# Patient Record
Sex: Female | Born: 1955 | Race: White | Hispanic: No | Marital: Married | Smoking: Current every day smoker
Health system: Southern US, Community
[De-identification: ages and names within clinical notes are randomized; demographics above are authoritative.]

## PROBLEM LIST (undated history)

## (undated) DIAGNOSIS — R51 Headache: Secondary | ICD-10-CM

## (undated) DIAGNOSIS — R102 Pelvic and perineal pain: Secondary | ICD-10-CM

## (undated) DIAGNOSIS — K219 Gastro-esophageal reflux disease without esophagitis: Secondary | ICD-10-CM

## (undated) DIAGNOSIS — Z9889 Other specified postprocedural states: Secondary | ICD-10-CM

## (undated) DIAGNOSIS — Z973 Presence of spectacles and contact lenses: Secondary | ICD-10-CM

## (undated) DIAGNOSIS — E039 Hypothyroidism, unspecified: Secondary | ICD-10-CM

## (undated) DIAGNOSIS — Z972 Presence of dental prosthetic device (complete) (partial): Secondary | ICD-10-CM

## (undated) DIAGNOSIS — F419 Anxiety disorder, unspecified: Secondary | ICD-10-CM

## (undated) DIAGNOSIS — N3010 Interstitial cystitis (chronic) without hematuria: Secondary | ICD-10-CM

## (undated) DIAGNOSIS — R32 Unspecified urinary incontinence: Secondary | ICD-10-CM

## (undated) DIAGNOSIS — R350 Frequency of micturition: Secondary | ICD-10-CM

## (undated) DIAGNOSIS — N9489 Other specified conditions associated with female genital organs and menstrual cycle: Secondary | ICD-10-CM

## (undated) DIAGNOSIS — M1990 Unspecified osteoarthritis, unspecified site: Secondary | ICD-10-CM

## (undated) DIAGNOSIS — G4700 Insomnia, unspecified: Secondary | ICD-10-CM

## (undated) DIAGNOSIS — F32A Depression, unspecified: Secondary | ICD-10-CM

## (undated) DIAGNOSIS — I10 Essential (primary) hypertension: Secondary | ICD-10-CM

## (undated) DIAGNOSIS — R519 Headache, unspecified: Secondary | ICD-10-CM

## (undated) DIAGNOSIS — D649 Anemia, unspecified: Secondary | ICD-10-CM

## (undated) DIAGNOSIS — E785 Hyperlipidemia, unspecified: Secondary | ICD-10-CM

## (undated) DIAGNOSIS — R3915 Urgency of urination: Secondary | ICD-10-CM

## (undated) DIAGNOSIS — F329 Major depressive disorder, single episode, unspecified: Secondary | ICD-10-CM

## (undated) DIAGNOSIS — R112 Nausea with vomiting, unspecified: Secondary | ICD-10-CM

## (undated) HISTORY — PX: VAGINAL HYSTERECTOMY: SUR661

## (undated) HISTORY — PX: JOINT REPLACEMENT: SHX530

## (undated) HISTORY — PX: TONSILLECTOMY: SUR1361

---

## 1998-01-02 ENCOUNTER — Emergency Department (HOSPITAL_COMMUNITY): Admission: EM | Admit: 1998-01-02 | Discharge: 1998-01-02 | Payer: Self-pay | Admitting: Emergency Medicine

## 1998-05-26 ENCOUNTER — Emergency Department (HOSPITAL_COMMUNITY): Admission: EM | Admit: 1998-05-26 | Discharge: 1998-05-26 | Payer: Self-pay | Admitting: Emergency Medicine

## 1999-02-26 ENCOUNTER — Other Ambulatory Visit: Admission: RE | Admit: 1999-02-26 | Discharge: 1999-02-26 | Payer: Self-pay | Admitting: Gynecology

## 1999-09-03 ENCOUNTER — Ambulatory Visit (HOSPITAL_COMMUNITY): Admission: RE | Admit: 1999-09-03 | Discharge: 1999-09-03 | Payer: Self-pay | Admitting: Urology

## 1999-11-25 ENCOUNTER — Other Ambulatory Visit: Admission: RE | Admit: 1999-11-25 | Discharge: 1999-11-25 | Payer: Self-pay | Admitting: Family Medicine

## 2000-01-23 ENCOUNTER — Encounter: Payer: Self-pay | Admitting: Family Medicine

## 2000-01-23 ENCOUNTER — Encounter: Admission: RE | Admit: 2000-01-23 | Discharge: 2000-01-23 | Payer: Self-pay | Admitting: Family Medicine

## 2000-04-06 ENCOUNTER — Encounter: Admission: RE | Admit: 2000-04-06 | Discharge: 2000-04-06 | Payer: Self-pay | Admitting: Family Medicine

## 2000-04-06 ENCOUNTER — Encounter: Payer: Self-pay | Admitting: Family Medicine

## 2001-01-15 ENCOUNTER — Emergency Department (HOSPITAL_COMMUNITY): Admission: EM | Admit: 2001-01-15 | Discharge: 2001-01-15 | Payer: Self-pay | Admitting: Emergency Medicine

## 2001-01-26 ENCOUNTER — Encounter: Payer: Self-pay | Admitting: Family Medicine

## 2001-01-26 ENCOUNTER — Encounter: Admission: RE | Admit: 2001-01-26 | Discharge: 2001-01-26 | Payer: Self-pay | Admitting: Family Medicine

## 2001-04-15 ENCOUNTER — Ambulatory Visit (HOSPITAL_COMMUNITY): Admission: RE | Admit: 2001-04-15 | Discharge: 2001-04-15 | Payer: Self-pay | Admitting: Urology

## 2001-05-19 ENCOUNTER — Encounter: Admission: RE | Admit: 2001-05-19 | Discharge: 2001-05-19 | Payer: Self-pay | Admitting: Family Medicine

## 2001-05-19 ENCOUNTER — Encounter: Payer: Self-pay | Admitting: Family Medicine

## 2001-09-02 ENCOUNTER — Encounter: Payer: Self-pay | Admitting: Family Medicine

## 2001-09-02 ENCOUNTER — Encounter: Admission: RE | Admit: 2001-09-02 | Discharge: 2001-09-02 | Payer: Self-pay | Admitting: Family Medicine

## 2002-07-14 ENCOUNTER — Encounter: Payer: Self-pay | Admitting: Family Medicine

## 2002-07-14 ENCOUNTER — Encounter: Admission: RE | Admit: 2002-07-14 | Discharge: 2002-07-14 | Payer: Self-pay | Admitting: Family Medicine

## 2002-11-10 ENCOUNTER — Encounter: Payer: Self-pay | Admitting: Urology

## 2002-11-10 ENCOUNTER — Ambulatory Visit (HOSPITAL_BASED_OUTPATIENT_CLINIC_OR_DEPARTMENT_OTHER): Admission: RE | Admit: 2002-11-10 | Discharge: 2002-11-10 | Payer: Self-pay | Admitting: Urology

## 2003-02-14 ENCOUNTER — Encounter: Admission: RE | Admit: 2003-02-14 | Discharge: 2003-02-14 | Payer: Self-pay | Admitting: Family Medicine

## 2003-02-14 ENCOUNTER — Encounter: Payer: Self-pay | Admitting: Family Medicine

## 2003-05-23 ENCOUNTER — Ambulatory Visit (HOSPITAL_COMMUNITY): Admission: RE | Admit: 2003-05-23 | Discharge: 2003-05-23 | Payer: Self-pay | Admitting: Urology

## 2003-06-01 ENCOUNTER — Other Ambulatory Visit: Admission: RE | Admit: 2003-06-01 | Discharge: 2003-06-01 | Payer: Self-pay | Admitting: Gynecology

## 2003-10-31 ENCOUNTER — Encounter: Admission: RE | Admit: 2003-10-31 | Discharge: 2003-10-31 | Payer: Self-pay | Admitting: Family Medicine

## 2004-03-19 ENCOUNTER — Ambulatory Visit (HOSPITAL_BASED_OUTPATIENT_CLINIC_OR_DEPARTMENT_OTHER): Admission: RE | Admit: 2004-03-19 | Discharge: 2004-03-19 | Payer: Self-pay | Admitting: Urology

## 2004-03-19 ENCOUNTER — Ambulatory Visit (HOSPITAL_COMMUNITY): Admission: RE | Admit: 2004-03-19 | Discharge: 2004-03-19 | Payer: Self-pay | Admitting: Urology

## 2004-10-17 ENCOUNTER — Ambulatory Visit (HOSPITAL_BASED_OUTPATIENT_CLINIC_OR_DEPARTMENT_OTHER): Admission: RE | Admit: 2004-10-17 | Discharge: 2004-10-17 | Payer: Self-pay | Admitting: Urology

## 2004-11-12 ENCOUNTER — Encounter: Admission: RE | Admit: 2004-11-12 | Discharge: 2004-11-12 | Payer: Self-pay | Admitting: Family Medicine

## 2005-04-17 ENCOUNTER — Ambulatory Visit (HOSPITAL_COMMUNITY): Admission: RE | Admit: 2005-04-17 | Discharge: 2005-04-17 | Payer: Self-pay | Admitting: Urology

## 2005-04-17 ENCOUNTER — Ambulatory Visit (HOSPITAL_BASED_OUTPATIENT_CLINIC_OR_DEPARTMENT_OTHER): Admission: RE | Admit: 2005-04-17 | Discharge: 2005-04-17 | Payer: Self-pay | Admitting: Urology

## 2006-01-15 ENCOUNTER — Ambulatory Visit (HOSPITAL_BASED_OUTPATIENT_CLINIC_OR_DEPARTMENT_OTHER): Admission: RE | Admit: 2006-01-15 | Discharge: 2006-01-15 | Payer: Self-pay | Admitting: Urology

## 2006-11-24 ENCOUNTER — Ambulatory Visit (HOSPITAL_BASED_OUTPATIENT_CLINIC_OR_DEPARTMENT_OTHER): Admission: RE | Admit: 2006-11-24 | Discharge: 2006-11-24 | Payer: Self-pay | Admitting: Urology

## 2007-11-04 ENCOUNTER — Ambulatory Visit (HOSPITAL_BASED_OUTPATIENT_CLINIC_OR_DEPARTMENT_OTHER): Admission: RE | Admit: 2007-11-04 | Discharge: 2007-11-04 | Payer: Self-pay | Admitting: Urology

## 2009-02-20 ENCOUNTER — Ambulatory Visit (HOSPITAL_BASED_OUTPATIENT_CLINIC_OR_DEPARTMENT_OTHER): Admission: RE | Admit: 2009-02-20 | Discharge: 2009-02-20 | Payer: Self-pay | Admitting: Urology

## 2010-08-27 ENCOUNTER — Ambulatory Visit
Admission: RE | Admit: 2010-08-27 | Discharge: 2010-08-27 | Payer: Self-pay | Source: Home / Self Care | Attending: Urology | Admitting: Urology

## 2010-11-25 LAB — POCT I-STAT 4, (NA,K, GLUC, HGB,HCT)
Glucose, Bld: 88 mg/dL (ref 70–99)
Potassium: 4.5 mEq/L (ref 3.5–5.1)

## 2010-12-23 LAB — POCT HEMOGLOBIN-HEMACUE: Hemoglobin: 14.8 g/dL (ref 12.0–15.0)

## 2011-01-28 NOTE — Op Note (Signed)
Kaitlyn [name], Kaitlyn [name]                  ACCOUNT NO.:  [id number]   MEDICAL RECORD NO.:  [id number]          PATIENT TYPE:  AMB   LOCATION:  [facility]                         FACILITY:  [facility]   PHYSICIAN:  Excell Seltzer. Annabell Howells, M.D.    DATE OF BIRTH:  Oct 03, 1955   DATE OF PROCEDURE:  02/20/2009  DATE OF DISCHARGE:                               OPERATIVE REPORT   PROCEDURE:  Cystoscopy, hydrodistention of bladder, urethral dilation,  installation of Pyridium and Marcaine.   PREOPERATIVE DIAGNOSIS:  Interstitial cystitis.   POSTOPERATIVE DIAGNOSIS:  Interstitial cystitis.   SURGEON:  Dr. Bjorn Pippin.   ANESTHESIA:  General.   SPECIMEN:  None.   DRAINS:  16-French Foley catheter.   COMPLICATIONS:  None.   INDICATIONS:  Ms. Birdwell is a 55 year old white female with a long history  of interstitial cystitis who requires periodic hydrodistention.   FINDINGS AND PROCEDURE:  She was given Cipro.  She was taken to the  operating room and general anesthetic was induced.  She was placed in  lithotomy position, her perineum and genitalia were prepped with  Betadine solution.  She was draped in the usual sterile fashion.  Cystoscopy was performed using a 22-French scope and 70 degrees lens.  Examination revealed a normal urethra.  The bladder wall had mild  trabeculation but no mucosal lesions.  Ureteral orifices were  unremarkable.   The bladder was then the hydrodistention under 80 cm of water pressure  to capacity and held for 3 minutes.  The bladder was then drained.  Her  capacity under anesthesia was 650 mL.  The terminal efflux was bloody.  Repeat cystoscopy revealed diffuse glomerulations consistent with  interstitial cystitis.  The urethra was then calibrated to 32-French  with female sounds.  She was snug at 28.  She then underwent placement  of a 16 Foley catheter and the bladder was instilled with 30 mL of 0.25%  Marcaine with 400 mg of crushed Pyridium.  The catheter was then  plugged.  This  will be left plugged for 15 minutes then drained, but she  will be sent home with a Foley catheter.  A B and O suppository was  placed was taken down from lithotomy position.  Her anesthetic was  reversed.  She was removed to the recovery room in stable condition.  There no complications.      Excell Seltzer. Annabell Howells, M.D.  Electronically Signed     JJW/MEDQ  D:  02/20/2009  T:  02/20/2009  Job:  [id number]

## 2011-01-28 NOTE — Op Note (Signed)
NAMEVERONIA, Kaitlyn [name]                  ACCOUNT NO.:  [id number]   MEDICAL RECORD NO.:  [id number]          PATIENT TYPE:  AMB   LOCATION:  [facility]                         FACILITY:  [facility]   PHYSICIAN:  Excell Seltzer. Annabell Howells, M.D.    DATE OF BIRTH:  August 28, 1956   DATE OF PROCEDURE:  11/04/2007  DATE OF DISCHARGE:                               OPERATIVE REPORT   PROCEDURE:  Cysto, hydrodistention of the bladder, urethral dilation,  installation of Pyridium and Marcaine.   PREOPERATIVE DIAGNOSIS:  Interstitial cystitis.   POSTOPERATIVE DIAGNOSIS:  Interstitial cystitis.   SURGEON:  Dr. Bjorn Pippin.   ANESTHESIA:  General.   SPECIMEN:  None.   COMPLICATIONS:  None.   INDICATIONS:  Kaitlyn [name] is a 55 year old white female with a long history of  interstitial cystitis.  He was having recurrent symptoms and generally  responds well to hydrodistention and has been almost a year since her  last dilation.  She tends to have urinary retention following the  procedure and is sent home with a Foley.   FINDINGS AND PROCEDURE:  She was taken to the operating room a where  general anesthetic was induced. She had  been given Cipro  preoperatively.  She was placed in lithotomy position.  Her perineum and  genitalia were prepped with Betadine solution.  She was draped in the  usual sterile fashion.  Cystoscopy was performed using a 22-French scope  and a 12 degree lens.  Examination revealed a normal urethra.  The  bladder wall had minimal tract trabeculation with no mucosal lesions.  The ureters were unremarkable.  The bladder was then dilated to capacity  under 80 cm of water pressure.  This was held for 3 minutes, the bladder  was then drained.  Her capacity under anesthesia was 700 mL.  The  terminal efflux was bloody. Repeat cystoscopy after hydrodistention  revealed mild but diffuse glomerulations consistent with her diagnosis  of interstitial cystitis. After recystoscopy, the urethra was dilated to  32-French with female sounds.  A Foley catheter was then inserted, the  balloon was filled with 10 mL of  sterile fluid.  Her bladder was instilled with 30 mL of 0.25% Marcaine  with 400 mg of crushed Pyridium and a B&O suppository was placed. The  catheter was left plugged, her anesthetic was reversed, she was moved to  the recovery room in stable condition. There were no complications.      Excell Seltzer. Annabell Howells, M.D.  Electronically Signed     JJW/MEDQ  D:  11/04/2007  T:  11/05/2007  Job:  [id number]   cc:   Burnell Blanks, MD  Fax: [phone]

## 2011-01-31 NOTE — Op Note (Signed)
NAMESOUL, DEVENEY                  ACCOUNT NO.:  [id number]   MEDICAL RECORD NO.:  [id number]          PATIENT TYPE:  AMB   LOCATION:  [facility]                         FACILITY:  [facility]   PHYSICIAN:  Excell Seltzer. Annabell Howells, M.D.    DATE OF BIRTH:  [birth year]   DATE OF PROCEDURE:  04/17/2005  DATE OF DISCHARGE:                                 OPERATIVE REPORT   PROCEDURE:  Cystoscopy, hydrodistention of bladder, urethral dilation,  instillation of Pyridium and Marcaine.   PREOPERATIVE DIAGNOSIS:  Interstitial cystitis.   POSTOPERATIVE DIAGNOSIS:  Interstitial cystitis.   SURGEON:  Excell Seltzer. Annabell Howells, M.D.   ANESTHESIA:  General.   COMPLICATIONS:  None.   INDICATIONS:  Ms. Mungin is a 54 year old white female with a long history of  interstitial cystitis who generally responds to  q.6 month hydrodistentions.   FINDINGS AND PROCEDURE:  She was given p.o. antibiotics preoperatively and  was taken to the operating room where general anesthetic was induced. She  was placed in the lithotomy position. Perineum and genitalia were prepped  with Betadine solution and she was draped in usual sterile fashion.  Cystoscopy was performed using the 22-French scope and 70 degree lens.  Examination revealed a normal urethra. Bladder wall had mild trabeculation.  No tumor, stones or inflammation were noted. Ureteral orifices were  unremarkable.   The cystoscope was removed. A Foley catheter was inserted. The bladder was  then filled under 80 cm water pressure to capacity. This was held for 5  minutes and the bladder was drained. Her capacity under anesthesia was 650  cc. The terminal efflux was bloody.   Repeat cystoscopy revealed diffuse mild glomerular hemorrhages without  evidence of cracking or ulceration.   Her urethra was then calibrated with female sounds. She was tight at 11-  [place]. I did not push beyond that.   She then underwent instillation of 30 cc of 0.25% Marcaine with 4500 mg of  crushed  Pyridium. This was left indwelling in the bladder.   A Foley catheter was inserted. She will be sent home with the catheter. Her  anesthetic was reversed. She was moved to the recovery room in stable  condition. There were no complications.       JJW/MEDQ  D:  04/17/2005  T:  04/17/2005  Job:  [id number]   cc:   Dr. Nathanial Rancher, [place], [place]

## 2011-01-31 NOTE — Op Note (Signed)
NAMEKEYUNNA, COCO                  ACCOUNT NO.:  [id number]   MEDICAL RECORD NO.:  [id number]          PATIENT TYPE:  AMB   LOCATION:  [facility]                         FACILITY:  [facility]   PHYSICIAN:  Excell Seltzer. Annabell Howells, M.D.    DATE OF BIRTH:  December 23, 1955   DATE OF PROCEDURE:  10/17/2004  DATE OF DISCHARGE:                                 OPERATIVE REPORT   PREOPERATIVE DIAGNOSIS:  Interstitial cystitis.   POSTOPERATIVE DIAGNOSIS:  Interstitial cystitis.   PROCEDURE:  Cystoscopy, hydrodistension of the bladder, urethral dilation,  instillation of Pyridium and Marcaine.   SURGEON:  Excell Seltzer. Annabell Howells, M.D.   ANESTHESIA:  General.   COMPLICATIONS:  None.   INDICATIONS FOR PROCEDURE:  Ava is a 55 year old white female with long  history of interstitial cystitis.  She requires hydrodistension  approximately every six months.  She returns today for that procedure.   DESCRIPTION OF PROCEDURE:  The patient was given preoperative antibiotics  with Levaquin 500 mg.  She was taken to the operating room where general  anesthetic was induced.  She was placed in the lithotomy position, and her  perineum and genitalia were prepped with Betadine solution and she was  draped in the usual sterile fashion.   Cystoscopy was performed using the 22 [place] scope and a 70-degree lens.  Examination revealed a smooth bladder wall without mucosal lesions.  The  ureteral orifices were unremarkable as was the urethra.  The cystoscope was  removed.  The 34 French Foley was inserted, and the balloon was filled with  10 cc of air.  The bladder was then dilated under 80 cmH2O pressure to  capacity.  This was held for five minutes, and the bladder was then drained.  She held 700 cc.  The terminal efflux was bloody.  The Foley was removed.  Repeat cystoscopy revealed some diffuse glomerular hemorrhages with bladder  bleeding but no other significant bladder wall injury.  The bladder was  drained.  The urethra was then  calibrated to 67 [place] with female sounds.  There was some narrowing of the urethral meatus.  The Foley catheter was  then reinserted, and the balloon was filled with 10 cc of sterile water.  The bladder was instilled with 30 cc of 0.25% Marcaine with 400 mg of  crushed Pyridium.  The catheter was plugged that we left indwelling for 15  minutes and then the catheter will be placed to straight drainage.   The patient's anesthetic was reversed.  She was moved to the recovery room  in stable condition.  There were no complications.      JJW/MEDQ  D:  10/17/2004  T:  10/17/2004  Job:  [id number]   cc:   Nathanial Rancher, M.D.  [place], [place]

## 2011-01-31 NOTE — Op Note (Signed)
NAMESIMRAN, BOMKAMP                  ACCOUNT NO.:  [id number]   MEDICAL RECORD NO.:  [id number]          PATIENT TYPE:  AMB   LOCATION:  [facility]                         FACILITY:  [facility]   PHYSICIAN:  Excell Seltzer. Annabell Howells, M.D.    DATE OF BIRTH:  Oct 08, 1955   DATE OF PROCEDURE:  11/24/2006  DATE OF DISCHARGE:                               OPERATIVE REPORT   PROCEDURE:  Cystoscopy, hydrodistention of bladder, urethral dilation,  installation of Pyridium and Marcaine.   PREOPERATIVE DIAGNOSIS:  Interstitial cystitis.   POSTOPERATIVE DIAGNOSIS:  Interstitial cystitis.   SURGEON:  Dr. Bjorn Pippin.   ANESTHESIA:  General.   COMPLICATIONS:  None.   INDICATIONS:  Japji is a 55 year old white female long history of  interstitial cystitis requiring periodic hydrodistention.   FINDINGS AND PROCEDURE:  She was given Cipro, taken to operating room  where general anesthetic was induced.  She was placed in lithotomy  position.  Her perineum and genitalia were prepped with Betadine  solution.  She was draped in usual sterile fashion.  Cysto was performed  with a 22-French scope and 70 degrees lens.  Examination revealed a  normal urethra.  The bladder wall was smooth and pale with no tumor,  stones or inflammation.  Ureteral orifices were unremarkable.  The  cystoscope was removed.  The 16-French catheter was inserted.  The  bladder was filled under 80 cm water pressure to capacity.  This was  maintained for 4 minutes.  The bladder was then drained.  Her capacity  under anesthesia was 600 mL.  The terminal efflux was bloody.  Repeat  cystoscopy after hydrodistention demonstrated diffuse glomerulations  primarily on the lateral walls and trigone.  The cystoscope was removed.  The urethra was then calibrated to 32-French with female sounds without  evidence of stenosis.  The Foley catheter was then reinserted, the  balloon was filled with 10 mL sterile fluid. Bladder was instilled with  30 mL of quarter  percent Marcaine and 400 mg crushed Pyridium.  This was  maintained in the bladder for 5 minutes and the catheter was placed to  straight drainage.  The patient was taken down from lithotomy position.  Her anesthetic was reversed.  She was removed to the recovery room in  stable condition.  There were no complications.      Excell Seltzer. Annabell Howells, M.D.  Electronically Signed     JJW/MEDQ  D:  11/24/2006  T:  11/24/2006  Job:  [id number]

## 2011-01-31 NOTE — Op Note (Signed)
NAMEJESSENYA, Kaitlyn [name]                  ACCOUNT NO.:  [id number]   MEDICAL RECORD NO.:  [id number]          PATIENT TYPE:  AMB   LOCATION:  [facility]                         FACILITY:  [facility]   PHYSICIAN:  Excell Seltzer. Annabell Howells, M.D.    DATE OF BIRTH:  [birth year]   DATE OF PROCEDURE:  01/15/2006  DATE OF DISCHARGE:                                 OPERATIVE REPORT   PROCEDURES:  Cystoscopy, hydrodistention of bladder, urethral dilation and  instillation of Pyridium and Marcaine.   PREOPERATIVE DIAGNOSIS:  Interstitial cystitis.   POSTOPERATIVE DIAGNOSIS:  Interstitial cystitis.   SURGEON:  Excell Seltzer. Annabell Howells, M.D.   ANESTHESIA:  General.   DRAIN:  Foley catheter.   COMPLICATIONS:  None.   INDICATIONS:  Kaitlyn [name] is a 55 year old white female with a long history of  interstitial cystitis, who requires hydrodistention approximately every  6-8  months.   FINDINGS AND PROCEDURE:  The patient was given antibiotic.  She was taken to  the operating room where a general anesthetic was induced.  She was placed  in the lithotomy position.  Her perineum and genitalia were prepped with  Betadine solution, and she was draped in the usual sterile fashion.  Cystoscopy was performed with the 22-French scope with 12 degree and 70  degree lenses.  Examination revealed a normal urethra.  The bladder wall had  mild trabeculation; no tumors, stones or inflammation were noted.  Ureteral  orifices were unremarkable.   The cystoscope was removed; the 16-French Foley catheter was inserted.  The  balloon was filled with 10 cc of air, and the bladder was then filled under  80 cm of water pressure capacity.  This was left at pressure for 3 minutes;  the bladder was then drained.  Her capacity under anesthesia was 600 cc.  The terminal efflux was bloody.   Free cystoscopy after hydrodistention revealed a minor amount of glomerular  hemorrhages, primarily in the trigone region.   The urethra was then calibrated to 34-French  with female sounds.  She was a  little tight at 34.   The 16-French Foley catheter was then reinserted.  The balloon was filled  with 10 cc of sterile water.  The bladder was instilled with a 30 cc of  0.25% Marcaine with 400 mg of crushed Pyridium.  The catheter was plugged.  This will be left indwelling for 15 minutes then the catheter will be placed  to drainage.   The patient was taken down from the lithotomy position.  Her anesthetic was  reversed.  She was moved to the recovery room in stable condition.  There  were no complications.      Excell Seltzer. Annabell Howells, M.D.  Electronically Signed     JJW/MEDQ  D:  01/15/2006  T:  01/16/2006  Job:  [id number]

## 2011-01-31 NOTE — Op Note (Signed)
[facility]  Patient:    Kaitlyn [name], Kaitlyn [name]                         MRN: [id number] Proc. Date: 04/15/01 Adm. Date:  [id number] Attending:  Evlyn Clines CC:         Maricela Bo, M.D.   Operative Report  PROCEDURE:  Cystoscopy, hydrodistention of bladder, urethral dilation, instillation of Clorpactin, Pyridium and Marcaine.  PREOPERATIVE DIAGNOSIS:  Interstitial cystitis.  POSTOPERATIVE DIAGNOSIS:  Interstitial cystitis.  SURGEON:  Excell Seltzer. Annabell Howells, M.D.  ANESTHESIA:  General.  DRAIN:  Foley.  COMPLICATIONS:  None.  INDICATIONS:  [name] is a 55 year old white female with a history of interstitial cystitis who has had recurrent symptoms.  She gets lasting relief from hydrodistention and elected to pursue that treatment option.  FINDINGS AND PROCEDURE:  The patient was taken to the operating room after receiving PO Tequin.  A general anesthetic was induced.  She was placed in the lithotomy position.  Her perineum and genitalia were prepped with Betadine solution.  She was draped in the usual sterile fashion.  Cystoscopy was performed using the 22 [place] scope and the 70 degree lens.  Examination revealed a smooth bladder wall without tumor, stones, or inflammation.  The ureteral orifices were in their normal anatomic position effluxing clear urine.  The urethra was unremarkable.  The cystoscope was removed, and an 78 French Foley catheter was inserted.  The bladder was filled, and the bladder was then filled under 80 cm of water pressure capacity.  This was held for five minutes.  The bladder was then drained.  Her capacity under anesthesia is 700 cc.  The terminal efflux was bloody.  Recystoscopy after distention revealed diffuse glomerular hemorrhages, particularly on the lateral walls of the bladder.  The cystoscope was then removed.  The Foley was inserted, and the bladder was drained.  The bladder was then instilled with 250 cc of  Clorpactin solution 1 amp and 1 liter.  After a fill-up time of about two minutes, the Foley catheter was removed, and the bladder was drained with the aid of female sounds.  The urethra was calibrated to 36 [place]. There was no stenosis.  After urethral dilation, the Foley catheter was reinserted.  The balloon was filled with 10 cc of sterile fluid.  The bladder was then instilled with 30 cc of 0.25% Marcaine with 400 mg of crushed Pyridium.  The catheter was clamped.  She was taken down from lithotomy position.  Her anesthetic was reversed.  She was moved to the recovery room in stable condition.  Her catheter will be unclamped in 15 minutes after instillation and connected to a leg bag.  She will go home with the catheter. She tends to have retention postdilation. DD:  04/15/01 TD:  04/16/01 Job: [id number]

## 2011-01-31 NOTE — Op Note (Signed)
NAME:  JA, [name]                            ACCOUNT NO.:  [id number]   MEDICAL RECORD NO.:  [id number]                   PATIENT TYPE:  AMB   LOCATION:  [facility]                                 FACILITY:  [facility]   PHYSICIAN:  Excell Seltzer. Annabell Howells, M.D.                 DATE OF BIRTH:  [birth year]   DATE OF PROCEDURE:  03/19/2004  DATE OF DISCHARGE:                                 OPERATIVE REPORT   PREOPERATIVE DIAGNOSES:  Interstitial cystitis.   POSTOPERATIVE DIAGNOSES:  Interstitial cystitis.   PROCEDURE:  Cystoscopy, hydrodistention of the bladder, urethral dilation,  installation of Pyridium and Marcaine.   SURGEON:  Excell Seltzer. Annabell Howells, M.D.   ANESTHESIA:  General.   DRAINS:  Foley.   COMPLICATIONS:  None.   INDICATIONS FOR PROCEDURE:  Kaitlyn [name] is a 55 year old white female with a  long history of interstitial cystitis who has recurrent symptoms and has  elected hydrodistention.   DESCRIPTION OF PROCEDURE:  The patient was taken to the operating room after  receiving Ancef 1 g. A general anesthetic was induced, she was placed in  lithotomy position and her perineum and genitalia were prepped with Betadine  solution and she was draped in the usual sterile fashion. Cystoscopy was  performed using the 22 [place] scope and the 70 degree lens. Examination  revealed an unremarkable bladder wall, ureteral orifices were unremarkable.  The urethra was unremarkable. The cystoscope was then removed, 16 [place]  Foley catheter was inserted, the balloon was filled with 10 mL of sterile  fluid. The bladder was then filled under 80 cm of water pressure to  capacity, this was held for 5 minutes, the bladder was drained. She held 700  mL under anesthesia with terminal efflux which was quite bloody.  Recystoscopy after dilation revealed diffuse glomerular hemorrhages  involving the majority of the bladder wall. At this point, the cystoscope  was removed, the urethra was calibrated to 32 [place], it was  slightly tight  without obvious stricture. The Foley catheter was then reinserted, balloon  was filled with 10 mL of sterile fluid, the bladder was instilled with 15 mL  of 0.25% Marcaine with 400 mg of crushed Pyridium and the catheter was  plugged. This will be maintained for 15 minutes and then placed to drainage.  She was taken down from lithotomy position, her anesthetic was reversed, she  was removed to the recovery room in stable condition. There were no  complications.                                               Excell Seltzer. Annabell Howells, M.D.    JJW/MEDQ  D:  03/19/2004  T:  03/19/2004  Job:  [id number]

## 2011-01-31 NOTE — Op Note (Signed)
NAME:  Kaitlyn [name], Kaitlyn [name]                            ACCOUNT NO.:  [id number]   MEDICAL RECORD NO.:  [id number]                   PATIENT TYPE:  AMB   LOCATION:  [facility]                                 FACILITY:  [facility]   PHYSICIAN:  Excell Seltzer. Annabell Howells, M.D.                 DATE OF BIRTH:  06-13-1956   DATE OF PROCEDURE:  05/23/2003  DATE OF DISCHARGE:                                 OPERATIVE REPORT   PROCEDURES:  1. Cystoscopy.  2. Hydrodistention of the bladder.  3. Instillation of Clorpactin.  4. Urethral dilation.  5. Instillation of Pyridium and Marcaine.   PREOPERATIVE DIAGNOSIS:  Interstitial cystitis.   POSTOPERATIVE DIAGNOSIS:  Interstitial cystitis.   SURGEON:  Excell Seltzer. Annabell Howells, M.D.   ANESTHESIA:  General.   DRAIN:  18 French Foley catheter.   COMPLICATIONS:  None.   INDICATIONS:  [name] is a 55 year old white female with chronic  interstitial cystitis, who has had recurrence of symptoms, and she has  elected to undergo hydrodistention of the bladder.   FINDINGS AT PROCEDURE:  The patient was given p.o. antibiotics.  She was  taken to the operating room where a general anesthetic was induced.  She was  placed in lithotomy position.  Her perineum and genitalia were prepped with  Betadine solution.  She was draped in the usual sterile fashion.  Cystoscopy  was performed using the 22 [place] scope and 12 and 70 degree lenses.  Examination revealed a normal urethra.  The ureteral orifices were  unremarkable.  The bladder wall was smooth and pale without tumors or  inflammation.  The cystoscope was removed, and an 19 French Foley catheter  was inserted.  The balloon was filled.  The bladder was then filled under 80  cm of water pressure to capacity.  This was held for five minutes, and the  bladder was then drained.  She held 750 mL under anesthesia.  The terminal  efflux was bloody.  At this point, re-cystoscopy revealed diffuse glomerular  hemorrhages of the bladder wall,  consistent with interstitial cystitis.  The  Foley catheter was then reinserted, and Clorpactin solution 1 amp in 500 mL  in normal saline was instilled in the bladder.  This was left indwelling for  three minutes; the bladder was then drained.  The catheter was removed.  The  urethra was calibrated to 36 [place].  The catheter was then reinserted, and  the bladder was instilled with 400 mg of Pyridium and 30 mL of 0.25%  Marcaine.  This was left indwelling for three minutes, then drained.  The catheter was  then placed to straight drainage.  The patient's anesthetic was reversed.  She was moved to the recovery room in stable condition, and there were no  complications.  Excell Seltzer. Annabell Howells, M.D.    JJW/MEDQ  D:  05/23/2003  T:  05/23/2003  Job:  [id number]

## 2011-01-31 NOTE — Op Note (Signed)
NAME:  Kaitlyn [name], Kaitlyn [name]                            ACCOUNT NO.:  [id number]   MEDICAL RECORD NO.:  [id number]                   PATIENT TYPE:  AMB   LOCATION:  [facility]                                 FACILITY:  [facility]   PHYSICIAN:  Excell Seltzer. Annabell Howells, M.D.                 DATE OF BIRTH:  1956/01/31   DATE OF PROCEDURE:  11/10/2002  DATE OF DISCHARGE:                                 OPERATIVE REPORT   PROCEDURE:  Cystoscopy, bilateral retrograde pyelograms with interpretation,  hydrodistention of the bladder, urethral dilation and installation of  Clorpactin, Pyridium and Marcaine.   PREOPERATIVE DIAGNOSES:  Hematuria and interstitial cystitis.   POSTOPERATIVE DIAGNOSES:  Hematuria and interstitial cystitis.   SURGEON:  Excell Seltzer. Annabell Howells, M.D.   ANESTHESIA:  General.   DRAINS:  Foley.   COMPLICATIONS:  None.   INDICATIONS FOR PROCEDURE:  Tom is a 55 year old white female with a  history of interstitial cystitis who requires hydrodistention of the bladder  approximately on an annual basis. She has also had some hematuria and it was  felt that Cysto and retrogrades was indicated at the time of her  hydrodistention.   DESCRIPTION OF PROCEDURE:  The patient was taken to the operating room where  she was given p.o. antibiotics preoperatively and a KUB was obtained which  was unremarkable except for some phleboliths in the pelvis. She was given a  general anesthetic, placed in lithotomy position and her perineum and  genitalia were prepped with Betadine solution and she was draped in the  usual sterile fashion. Cystoscopy was performed with the 12 and 70 degree  lenses. Using a 22 [place] scope, examination revealed a normal urethra, the  bladder wall was smooth and pail without tumor, stones or inflammation. The  ureteral orifices had some small glomerulations around them after the  initial Cysto, although otherwise unremarkable reflux of clear urine. The  right ureteral orifice was cannulated  with a 5 [place] open end catheter,  contrast was instilled. The study revealed a normal ureter and intrarenal  collecting system on the right. This study was then repeated on the left and  once again a normal ureter and intrarenal collecting system were seen. After  completion of the retrogrades, the cystoscope was removed and a Foley  catheter was inserted and the balloon was filled with 10 mL of sterile fluid  and the catheter was connected to the fluid tubing and the bladder was then  filled under 80 cm of water pressure to capacity. This was held for five  minutes. The bladder was then drained to capacity under anesthesia. It was  850 mL. The terminal efflux was quite bloody. Recystoscopy after  hydrodistention revealed some crack in the mucosa primarily on the right  lateral wall of the bladder and diffuse glomerular hemorrhage. At this  point, the urethra was calibrated to 66 [place] with female sounds, there  was  no evidence of stricture. A Foley catheter was then reinserted and balloon  was filled with 10 mL of sterile fluid. The bladder was then instilled with  240 mL of Clorpactin mixed 1 amp in 1 liter of saline. This was left  indwelling for just over a minute, the bladder was then drained and the  bladder was instilled with 30 mL of 0.25% Marcaine with 400 mg of crushed  Pyridium. The Foley was plugged at this point. The patient was taken down  from lithotomy position, her anesthetic was reversed. She was taken to the  recovery room in stable condition.  The catheter will remain plugged until she gets to the recovery room when it  will be placed to drainage to the leg bag. She will be discharged home with  the catheter because of her prior episodes of retention following this  procedure. There were no complications during the procedure.                                                Excell Seltzer. Annabell Howells, M.D.    JJW/MEDQ  D:  11/10/2002  T:  11/10/2002  Job:  [id number]

## 2011-06-06 LAB — POCT HEMOGLOBIN-HEMACUE: Operator id: 268271

## 2011-09-03 ENCOUNTER — Other Ambulatory Visit: Payer: Self-pay | Admitting: Urology

## 2011-09-03 MED ORDER — BUPIVACAINE HCL (PF) 0.5 % IJ SOLN: Freq: Once | INTRAMUSCULAR | Status: DC

## 2011-09-17 ENCOUNTER — Encounter (HOSPITAL_BASED_OUTPATIENT_CLINIC_OR_DEPARTMENT_OTHER): Payer: Self-pay | Admitting: *Deleted

## 2011-09-17 NOTE — Progress Notes (Signed)
NPO AFTER MN. ARRIVES AT 0615. NEEDS HG. ASK MDA IF NEEDS EKG.

## 2011-09-17 NOTE — H&P (Signed)
Active Problems Problems  1. Bladder Pain 788.99 2. Chronic Interstitial Cystitis 595.1  History of Present Illness  Kaitlyn [name] returns today with recurrent symptoms which have been present over the last 3-4 months.  She has a history of  IC but is just on pyridium.  She has been out of Elmiron for several months. She has required occasional HOD's.  She is primary having moderate pain with a full bladder and dysuria.  The symptoms aren't every day.   She can have some urine leakage after she voids.  She has intermittant frequency and nocturia x2.  She has some new SUI and some UUI.  She denies hematuria.   Past Medical History Problems  1. History of  Acute Urinary Retention 788.20 2. History of  Anxiety (Symptom) 300.00 3. History of  Arthritis V13.4 4. History of  Bladder Irrigation 5. History of  Bladder Irrigation 6. History of  Chronic Interstitial Cystitis 595.1 7. History of  Depression 311 8. History of  Esophageal Reflux 530.81 9. History of  Hypercholesterolemia 272.0 10. History of  Hypothyroidism 244.9  Surgical History Problems  1. History of  Bladder Surgery 2. History of  Cystoscopy With Dilation Of Bladder 3. History of  Cystoscopy With Dilation Of Bladder 4. History of  Cystoscopy With Dilation Of Bladder 5. History of  Hysterectomy V45.77 6. History of  Tonsillectomy  Current Meds 1. Depakote 125 MG Oral Tablet Delayed Release; Therapy: (Recorded:13Feb2009) to 2. Diazepam 5 MG Oral Tablet; Therapy: 01Dec2012 to 3. Elmiron 100 MG Oral Capsule; TAKE 1 CAPSULE 3 TIMES A DAY; Therapy: 03Aug2009 to (Last  Rx:08Dec2011) 4. Levothyroxine Sodium 75 MCG Oral Tablet; Therapy: 08Nov2011 to 5. Lexapro 20 MG Oral Tablet; Therapy: 20Jun2012 to 6. LORazepam 1 MG Oral Tablet; Therapy: 01Dec2012 to 7. Phenazopyridine HCl 200 MG Oral Tablet; TAKE 1 TABLET 3 TIMES A  DAY; Therapy: 20Feb2008  to (Evaluate:02Dec2012); Last Rx:08Dec2011 8. Pravastatin Sodium 40 MG Oral Tablet;  Therapy: 13Jul2012 to  Allergies Medication  1. Sulfa Drugs  Family History Problems  1. Family history of  Breast Cancer V16.3 aunt 2. Family history of  Family Health Status Number Of Children 1 daughter 3. Maternal history of  Lung Cancer V16.1 4. Paternal history of  Lung Cancer V16.1 5. Fraternal history of  Nephrolithiasis  Social History Problems  1. Caffeine Use 2 cps coffee a day 2. Family history of  Death In The Family Father age 72 lung cancer 3. Family history of  Death In The Family Mother age 44 lung cancer 4. Marital History - Currently Married 5. Occupation: [employer] 6. Tobacco Use V15.82 1/2 pack, 10 years Denied  7. Alcohol Use  Review of Systems  Genitourinary: urinary frequency, nocturia, incontinence and pelvic pain.  Constitutional: no fever.  Musculoskeletal: back pain.    Vitals Vital Signs [Data Includes: Last 1 Day]  13Dec2012 01:45PM  BMI Calculated: 28.2 BSA Calculated: 1.57 Height: 4 ft 10.5 in Weight: 138 lb  Blood Pressure: 146 / 96 Temperature: 97.6 F Heart Rate: 86  Physical Exam Constitutional: Well nourished and well developed . No acute distress.  Pulmonary: No respiratory distress and normal respiratory rhythm and effort.  Cardiovascular: Heart rate and rhythm are normal . No peripheral edema.    Results/Data Urine [Data Includes: Last 1 Day]   13Dec2012  COLOR YELLOW   APPEARANCE CLEAR   SPECIFIC GRAVITY 1.020   pH 6.0   GLUCOSE NEG mg/dL  BILIRUBIN NEG   KETONE NEG mg/dL  BLOOD SMALL  PROTEIN NEG mg/dL  UROBILINOGEN 0.2 mg/dL  NITRITE NEG   LEUKOCYTE ESTERASE NEG   SQUAMOUS EPITHELIAL/HPF FEW   WBC NONE SEEN WBC/hpf  RBC 0-3 RBC/hpf  BACTERIA NONE SEEN   CRYSTALS NONE SEEN   CASTS NONE SEEN    Assessment Assessed  1. Chronic Interstitial Cystitis 595.1 2. Bladder Pain 788.99   Keshana has recurrent bladder symptoms from her IC and wants to proceed to HOD.   Plan Bladder Pain (788.99)  1.  Hydrocodone-Acetaminophen 5-325 MG Oral Tablet; 1 - 2 po q 4-6 hours prn pain; Therapy:  18Jun2010 to (Last Rx:13Dec2012) Chronic Interstitial Cystitis (595.1)  2. Elmiron 100 MG Oral Capsule; TAKE 1 CAPSULE 3 TIMES A DAY; Therapy: 03Aug2009 to (Last  Rx:13Dec2012) 3. Follow-up Schedule Surgery Office  Follow-up  Requested for: 13Dec2012 Health Maintenance (V70.0)  4. UA With REFLEX  Done: 13Dec2012 01:36PM   She is aware of the risks of cystoscopy and HOD with instillation of pyridium and marcaine.   She generally has post HOD retention and will go home with a foley. I have refilled her Elmiron and given her a script for hydrocodone for the pain.

## 2011-09-18 ENCOUNTER — Ambulatory Visit (HOSPITAL_BASED_OUTPATIENT_CLINIC_OR_DEPARTMENT_OTHER): Payer: BC Managed Care – PPO | Admitting: Anesthesiology

## 2011-09-18 ENCOUNTER — Encounter (HOSPITAL_BASED_OUTPATIENT_CLINIC_OR_DEPARTMENT_OTHER)
Admission: RE | Disposition: A | Discharge status: Home / Self Care | Payer: Self-pay | Source: Ambulatory Visit | Attending: Urology

## 2011-09-18 ENCOUNTER — Other Ambulatory Visit: Payer: Self-pay

## 2011-09-18 ENCOUNTER — Ambulatory Visit (HOSPITAL_BASED_OUTPATIENT_CLINIC_OR_DEPARTMENT_OTHER)
Admission: RE | Admit: 2011-09-18 | Discharge: 2011-09-18 | Disposition: A | Discharge status: Home / Self Care | Payer: BC Managed Care – PPO | Source: Ambulatory Visit | Attending: Urology | Admitting: Urology

## 2011-09-18 ENCOUNTER — Encounter (HOSPITAL_BASED_OUTPATIENT_CLINIC_OR_DEPARTMENT_OTHER): Payer: Self-pay | Admitting: *Deleted

## 2011-09-18 ENCOUNTER — Encounter (HOSPITAL_BASED_OUTPATIENT_CLINIC_OR_DEPARTMENT_OTHER): Payer: Self-pay | Admitting: Anesthesiology

## 2011-09-18 DIAGNOSIS — N3010 Interstitial cystitis (chronic) without hematuria: Secondary | ICD-10-CM | POA: Insufficient documentation

## 2011-09-18 DIAGNOSIS — E039 Hypothyroidism, unspecified: Secondary | ICD-10-CM | POA: Insufficient documentation

## 2011-09-18 DIAGNOSIS — E7800 Pure hypercholesterolemia, unspecified: Secondary | ICD-10-CM | POA: Insufficient documentation

## 2011-09-18 DIAGNOSIS — K219 Gastro-esophageal reflux disease without esophagitis: Secondary | ICD-10-CM | POA: Insufficient documentation

## 2011-09-18 DIAGNOSIS — Z79899 Other long term (current) drug therapy: Secondary | ICD-10-CM | POA: Insufficient documentation

## 2011-09-18 HISTORY — DX: Hyperlipidemia, unspecified: E78.5

## 2011-09-18 HISTORY — DX: Depression, unspecified: F32.A

## 2011-09-18 HISTORY — DX: Interstitial cystitis (chronic) without hematuria: N30.10

## 2011-09-18 HISTORY — DX: Unspecified urinary incontinence: R32

## 2011-09-18 HISTORY — PX: CYSTO WITH HYDRODISTENSION: SHX5453

## 2011-09-18 HISTORY — DX: Pelvic and perineal pain: R10.2

## 2011-09-18 HISTORY — DX: Hypothyroidism, unspecified: E03.9

## 2011-09-18 HISTORY — DX: Major depressive disorder, single episode, unspecified: F32.9

## 2011-09-18 HISTORY — DX: Other specified postprocedural states: R11.2

## 2011-09-18 HISTORY — DX: Frequency of micturition: R35.0

## 2011-09-18 HISTORY — DX: Gastro-esophageal reflux disease without esophagitis: K21.9

## 2011-09-18 HISTORY — DX: Other specified conditions associated with female genital organs and menstrual cycle: N94.89

## 2011-09-18 HISTORY — DX: Insomnia, unspecified: G47.00

## 2011-09-18 HISTORY — DX: Other specified postprocedural states: Z98.890

## 2011-09-18 HISTORY — DX: Urgency of urination: R39.15

## 2011-09-18 LAB — POCT HEMOGLOBIN-HEMACUE: Hemoglobin: 13 g/dL (ref 12.0–15.0)

## 2011-09-18 SURGERY — CYSTOSCOPY, WITH BLADDER HYDRODISTENSION
Anesthesia: General | Site: Bladder | Wound class: Clean Contaminated

## 2011-09-18 MED ORDER — HYDROCODONE-ACETAMINOPHEN 5-325 MG PO TABS
1.0000 | ORAL_TABLET | Freq: Four times a day (QID) | ORAL | Status: AC | PRN
  Administered 2011-09-18: 1 via ORAL

## 2011-09-18 MED ORDER — PROPOFOL 10 MG/ML IV EMUL
INTRAVENOUS | Status: DC | PRN
  Administered 2011-09-18: 180 mg via INTRAVENOUS

## 2011-09-18 MED ORDER — ONDANSETRON HCL 4 MG/2ML IJ SOLN
INTRAMUSCULAR | Status: DC | PRN
  Administered 2011-09-18: 4 mg via INTRAVENOUS

## 2011-09-18 MED ORDER — BELLADONNA ALKALOIDS-OPIUM 16.2-60 MG RE SUPP
RECTAL | Status: DC | PRN
  Administered 2011-09-18: 1 via RECTAL

## 2011-09-18 MED ORDER — LACTATED RINGERS IV SOLN
INTRAVENOUS | Status: DC
  Administered 2011-09-18 (×2): via INTRAVENOUS

## 2011-09-18 MED ORDER — STERILE WATER FOR IRRIGATION IR SOLN
Status: DC | PRN
  Administered 2011-09-18: 3000 mL

## 2011-09-18 MED ORDER — FENTANYL CITRATE 0.05 MG/ML IJ SOLN
25.0000 ug | INTRAMUSCULAR | Status: DC | PRN
  Administered 2011-09-18: 25 ug via INTRAVENOUS

## 2011-09-18 MED ORDER — PHENAZOPYRIDINE HCL 200 MG PO TABS
200.0000 mg | ORAL_TABLET | Freq: Three times a day (TID) | ORAL | Status: AC
  Administered 2011-09-18: 200 mg via ORAL

## 2011-09-18 MED ORDER — BUPIVACAINE HCL 0.5 % IJ SOLN
INTRAMUSCULAR | Status: DC | PRN
  Administered 2011-09-18: 30 mL

## 2011-09-18 MED ORDER — FENTANYL CITRATE 0.05 MG/ML IJ SOLN
INTRAMUSCULAR | Status: DC | PRN
  Administered 2011-09-18: 50 ug via INTRAVENOUS

## 2011-09-18 MED ORDER — LIDOCAINE HCL (CARDIAC) 20 MG/ML IV SOLN
INTRAVENOUS | Status: DC | PRN
  Administered 2011-09-18: 50 mg via INTRAVENOUS

## 2011-09-18 MED ORDER — DEXAMETHASONE SODIUM PHOSPHATE 4 MG/ML IJ SOLN
INTRAMUSCULAR | Status: DC | PRN
  Administered 2011-09-18: 8 mg via INTRAVENOUS

## 2011-09-18 MED ORDER — PHENAZOPYRIDINE HCL 200 MG PO TABS: 200.0000 mg | ORAL_TABLET | Freq: Three times a day (TID) | ORAL | Status: AC | PRN

## 2011-09-18 MED ORDER — PROMETHAZINE HCL 25 MG/ML IJ SOLN: 6.2500 mg | INTRAMUSCULAR | Status: DC | PRN

## 2011-09-18 MED ORDER — CIPROFLOXACIN IN D5W 400 MG/200ML IV SOLN
400.0000 mg | INTRAVENOUS | Status: AC
  Administered 2011-09-18: 400 mg via INTRAVENOUS

## 2011-09-18 MED ORDER — HYDROCODONE-ACETAMINOPHEN 5-325 MG PO TABS: 1.0000 | ORAL_TABLET | Freq: Four times a day (QID) | ORAL | Status: AC | PRN

## 2011-09-18 MED ORDER — PHENAZOPYRIDINE HCL 200 MG PO TABS
ORAL | Status: DC | PRN
  Administered 2011-09-18: 08:00:00 via INTRAVESICAL

## 2011-09-18 SURGICAL SUPPLY — 18 items
BAG DRAIN URO-CYSTO SKYTR STRL (DRAIN) ×2 IMPLANT
CANISTER SUCT LVC 12 LTR MEDI- (MISCELLANEOUS) IMPLANT
CATH FOLEY 2WAY SLVR  5CC 16FR (CATHETERS) ×1
CATH FOLEY 2WAY SLVR 5CC 16FR (CATHETERS) ×1 IMPLANT
CLOTH BEACON ORANGE TIMEOUT ST (SAFETY) ×2 IMPLANT
DRAPE CAMERA CLOSED 9X96 (DRAPES) ×2 IMPLANT
ELECT REM PT RETURN 9FT ADLT (ELECTROSURGICAL) ×2
ELECTRODE REM PT RTRN 9FT ADLT (ELECTROSURGICAL) ×1 IMPLANT
GLOVE INDICATOR 6.5 STRL GRN (GLOVE) ×4 IMPLANT
GLOVE SURG SS PI 8.0 STRL IVOR (GLOVE) ×2 IMPLANT
GOWN STRL REIN XL XLG (GOWN DISPOSABLE) ×2 IMPLANT
NDL SAFETY ECLIPSE 18X1.5 (NEEDLE) ×1 IMPLANT
NEEDLE HYPO 18GX1.5 SHARP (NEEDLE) ×1
NS IRRIG 500ML POUR BTL (IV SOLUTION) IMPLANT
PACK CYSTOSCOPY (CUSTOM PROCEDURE TRAY) ×2 IMPLANT
PLUG CATH AND CAP STER (CATHETERS) ×2 IMPLANT
SYR 30ML LL (SYRINGE) ×2 IMPLANT
WATER STERILE IRR 3000ML UROMA (IV SOLUTION) ×2 IMPLANT

## 2011-09-18 NOTE — Interval H&P Note (Signed)
History and Physical Interval Note:  09/18/2011 7:19 AM  Kaitlyn [name]  has presented today for surgery, with the diagnosis of Incontinence  The various methods of treatment have been discussed with the patient and family. After consideration of risks, benefits and other options for treatment, the patient has consented to  Procedure(s): CYSTOSCOPY/HYDRODISTENSION as a surgical intervention .  The patients' history has been reviewed, patient examined, no change in status, stable for surgery.  I have reviewed the patients' chart and labs.  Questions were answered to the patient's satisfaction.     Paublo Warshawsky J

## 2011-09-18 NOTE — Brief Op Note (Signed)
09/18/2011  7:54 AM  PATIENT:  Kaitlyn [name]  56 y.o. female  PRE-OPERATIVE DIAGNOSIS: Interstitial cystitis   POST-OPERATIVE DIAGNOSIS:  Same PROCEDURE:  Procedure(s): CYSTOSCOPY/HYDRODISTENSION INSTILLATION OF PYRIDIUM AND MARCAINE  SURGEON:  Surgeon(s): Anner Crete  PHYSICIAN ASSISTANT:   ASSISTANTS: none   ANESTHESIA:   general  EBL:  Total I/O In: 100 [I.V.:100] Out: -   BLOOD ADMINISTERED:none  DRAINS: Urinary Catheter (Foley)   LOCAL MEDICATIONS USED:  MARCAINE 30CC  SPECIMEN:  No Specimen  DISPOSITION OF SPECIMEN:  N/A  COUNTS:  YES  TOURNIQUET:  * No tourniquets in log *  DICTATION: .Other Dictation: Dictation Number [id number]  PLAN OF CARE: Discharge to home after PACU  PATIENT DISPOSITION:  PACU - hemodynamically stable.   Delay start of Pharmacological VTE agent (>24hrs) due to surgical blood loss or risk of bleeding:  {YES/NO/NOT APPLICABLE:20182

## 2011-09-18 NOTE — Anesthesia Postprocedure Evaluation (Signed)
  Anesthesia Post-op Note  Patient: Kaitlyn [name]  Procedure(s) Performed:  CYSTOSCOPY/HYDRODISTENSION - Instillation of Marcaine & Pyridium  Patient Location: PACU  Anesthesia Type: General  Level of Consciousness: awake and oriented  Airway and Oxygen Therapy: Patient Spontanous Breathing  Post-op Pain: mild  Post-op Assessment: Post-op Vital signs reviewed, Patient's Cardiovascular Status Stable, Respiratory Function Stable and Patent Airway  Post-op Vital Signs: stable  Complications: No apparent anesthesia complications

## 2011-09-18 NOTE — Anesthesia Procedure Notes (Signed)
Procedure Name: LMA Insertion Date/Time: 09/18/2011 7:39 AM Performed by: Lorrin Jackson Pre-anesthesia Checklist: Patient identified, Emergency Drugs available, Suction available and Patient being monitored Patient Re-evaluated:Patient Re-evaluated prior to inductionOxygen Delivery Method: Circle System Utilized Preoxygenation: Pre-oxygenation with 100% oxygen Intubation Type: IV induction Ventilation: Mask ventilation without difficulty LMA: LMA inserted LMA Size: 4.0 Number of attempts: 1 Placement Confirmation: positive ETCO2 Tube secured with: Tape Dental Injury: Teeth and Oropharynx as per pre-operative assessment  Comments: Inserted by Dr. Shireen Quan

## 2011-09-18 NOTE — Op Note (Signed)
NAMEWREATHA, STURGEON                  ACCOUNT NO.:  [id number]  MEDICAL RECORD NO.:  [id number]  LOCATION:                               FACILITY:  [facility]  PHYSICIAN:  Excell Seltzer. Annabell Howells, M.D.    DATE OF BIRTH:  1956/04/29  DATE OF PROCEDURE:  09/18/2011 DATE OF DISCHARGE:                              OPERATIVE REPORT   PROCEDURE:  Cystoscopy, hydrodistention of the bladder with instillation of Pyridium and Marcaine.  PREOPERATIVE DIAGNOSIS:  Interstitial cystitis.  POSTOPERATIVE DIAGNOSIS:  Interstitial cystitis.  SURGEON:  Excell Seltzer. Annabell Howells, M.D.  ANESTHESIA:  General.  SPECIMEN:  None.  DRAIN:  16-French Foley catheter.  COMPLICATIONS:  None.  INDICATIONS:  Keyari is a 56 year old white female with longstanding history of interstitial cystitis who requires periodic hydrodistention of bladder.  FINDINGS OF PROCEDURE:  She was taken to the operating room where she was given Cipro.  A general anesthetic was induced.  She was placed in lithotomy position and PAS hose were fitted.  Her genitalia was prepped with Betadine solution.  She was draped in usual sterile fashion.  Time- out was performed.  Cystoscopy was performed using the 22-French scope and 12 degree lens. Examination revealed a normal urethra.  The bladder wall had mild trabeculation, but no mucosal lesions.  Ureteral orifices were unremarkable.  The bladder was then filled under 80 cm of water pressure to capacity. This was held for 2 minutes and the bladder was then drained.  Her capacity under anesthesia was 600 cc.  After hydrodistention, repeat cystoscopy revealed glomerulations in the trigone and lateral walls of the bladder consistent with interstitial cystitis.  At this point, the bladder was drained and a 16-French Foley catheter was inserted.  The bladder was instilled with 30 cc of 0.25% Marcaine with 400 mg crushed Pyridium. The catheter balloon was filled with 10 cc sterile fluid and the catheter was  plugged.  She was taken down from lithotomy position.  Her anesthetic was reversed.  She was moved to the recovery room in stable condition.  She will be sent home with Foley catheter drainage because of prior post- distention-retention issues.     Excell Seltzer. Annabell Howells, M.D.     JJW/MEDQ  D:  09/18/2011  T:  09/18/2011  Job:  [id number]

## 2011-09-18 NOTE — Anesthesia Preprocedure Evaluation (Addendum)
Anesthesia Evaluation  Patient identified by MRN, date of birth, ID band Patient awake    Reviewed: Allergy & Precautions, H&P , NPO status , Patient's Chart, lab work & pertinent test results, reviewed documented beta blocker date and time   History of Anesthesia Complications (+) PONV  Airway Mallampati: II  Neck ROM: Full  Mouth opening: Limited Mouth Opening  Dental  (+) Partial Upper   Pulmonary neg pulmonary ROS, Current Smoker,  clear to auscultation        Cardiovascular neg cardio ROS - CAD Regular Normal Denies cardiac symptoms   Neuro/Psych Bipolar Disorder Negative Neurological ROS     GI/Hepatic negative GI ROS, Neg liver ROS,   Endo/Other  Hypothyroidism Thyroid replacement  Renal/GU negative Renal ROS  Genitourinary negative   Musculoskeletal negative musculoskeletal ROS (+)   Abdominal   Peds negative pediatric ROS (+)  Hematology negative hematology ROS (+)   Anesthesia Other Findings   Reproductive/Obstetrics negative OB ROS                          Anesthesia Physical Anesthesia Plan  ASA: II  Anesthesia Plan: General   Post-op Pain Management:    Induction: Intravenous  Airway Management Planned: LMA  Additional Equipment:   Intra-op Plan:   Post-operative Plan: Extubation in OR  Informed Consent: I have reviewed the patients History and Physical, chart, labs and discussed the procedure including the risks, benefits and alternatives for the proposed anesthesia with the patient or authorized representative who has indicated his/her understanding and acceptance.     Plan Discussed with: CRNA and Surgeon  Anesthesia Plan Comments:         Anesthesia Quick Evaluation

## 2011-09-18 NOTE — Transfer of Care (Signed)
Immediate Anesthesia Transfer of Care Note  Patient: Kaitlyn [name]  Procedure(s) Performed:  CYSTOSCOPY/HYDRODISTENSION - Instillation of Marcaine & Pyridium  Patient Location: Patient transported to PACU with oxygen via face mask at 6 Liters / Min  Anesthesia Type: General  Level of Consciousness: sedated and alert   Airway & Oxygen Therapy: Patient Spontanous Breathing and Patient connected to face mask oxygen Post-op Assessment: Report given to PACU RN and Post -op Vital signs reviewed and stable  Post vital signs: Reviewed and stable  Complications: No apparent anesthesia complications

## 2011-09-19 ENCOUNTER — Encounter (HOSPITAL_BASED_OUTPATIENT_CLINIC_OR_DEPARTMENT_OTHER): Payer: Self-pay | Admitting: Urology

## 2012-06-22 ENCOUNTER — Other Ambulatory Visit (HOSPITAL_COMMUNITY): Payer: Self-pay | Admitting: Neurology

## 2012-06-22 DIAGNOSIS — E039 Hypothyroidism, unspecified: Secondary | ICD-10-CM

## 2012-06-22 DIAGNOSIS — F329 Major depressive disorder, single episode, unspecified: Secondary | ICD-10-CM

## 2012-06-22 DIAGNOSIS — R413 Other amnesia: Secondary | ICD-10-CM

## 2012-06-22 DIAGNOSIS — F3289 Other specified depressive episodes: Secondary | ICD-10-CM

## 2012-06-24 ENCOUNTER — Other Ambulatory Visit: Payer: Self-pay | Admitting: Radiology

## 2012-06-25 ENCOUNTER — Ambulatory Visit (HOSPITAL_COMMUNITY)
Admission: RE | Admit: 2012-06-25 | Discharge: 2012-06-25 | Disposition: A | Discharge status: Home / Self Care | Payer: BC Managed Care – PPO | Source: Ambulatory Visit | Attending: Neurology | Admitting: Neurology

## 2012-06-25 DIAGNOSIS — F329 Major depressive disorder, single episode, unspecified: Secondary | ICD-10-CM

## 2012-06-25 DIAGNOSIS — E039 Hypothyroidism, unspecified: Secondary | ICD-10-CM

## 2012-06-25 DIAGNOSIS — R413 Other amnesia: Secondary | ICD-10-CM | POA: Insufficient documentation

## 2012-06-25 LAB — GRAM STAIN

## 2012-06-25 LAB — PROTEIN, CSF: Total  Protein, CSF: 27 mg/dL (ref 15–45)

## 2012-06-25 LAB — CSF CELL COUNT WITH DIFFERENTIAL
Eosinophils, CSF: NONE SEEN % (ref 0–1)
Segmented Neutrophils-CSF: NONE SEEN % (ref 0–6)
WBC, CSF: 1 /mm3 (ref 0–5)

## 2012-06-25 LAB — GLUCOSE, CSF: Glucose, CSF: 55 mg/dL (ref 43–76)

## 2012-06-25 MED ORDER — ACETAMINOPHEN 325 MG PO TABS
650.0000 mg | ORAL_TABLET | ORAL | Status: DC | PRN
  Administered 2012-06-25: 650 mg via ORAL

## 2012-06-25 MED ORDER — ACETAMINOPHEN 325 MG PO TABS
ORAL_TABLET | ORAL | Status: AC
  Filled 2012-06-25: qty 2

## 2012-06-25 NOTE — Procedures (Signed)
Lumbar puncture at L34 in radiology.  Clear CSF sent to lab

## 2012-06-25 NOTE — Progress Notes (Signed)
No complaints of pain. Waiting for short stay bed. Lying flat in bed. Sister in law with patient. Bandaid to back clean dry and intact.

## 2012-06-30 LAB — MULTIPLE SCLEROSIS PANEL 2
Albumin CSF: 13 mg/dL (ref <35)
Albumin: 4320 mg/dL (ref 3700–5410)
IgA MSPROF: <0.001 mg/dL (ref <0.010)
IgG Total CSF: 1.4 mg/dL (ref 0.5–6.1)
IgG: <0.01 mg/dL (ref <0.10)
IgM MSPROF: 0.006 mg/dL (ref <0.010)
IgM Total: 38 mg/dL — ABNORMAL LOW (ref 48–271)
Myelin basic protein, csf: <2 mcg/L (ref <4.1)

## 2012-07-05 ENCOUNTER — Other Ambulatory Visit (HOSPITAL_COMMUNITY): Payer: Self-pay | Admitting: Neurology

## 2012-07-05 DIAGNOSIS — R413 Other amnesia: Secondary | ICD-10-CM

## 2012-07-06 ENCOUNTER — Ambulatory Visit (HOSPITAL_COMMUNITY): Payer: BC Managed Care – PPO | Attending: Cardiology

## 2012-07-06 DIAGNOSIS — R269 Unspecified abnormalities of gait and mobility: Secondary | ICD-10-CM | POA: Insufficient documentation

## 2012-07-06 DIAGNOSIS — F17200 Nicotine dependence, unspecified, uncomplicated: Secondary | ICD-10-CM | POA: Insufficient documentation

## 2012-07-06 DIAGNOSIS — R413 Other amnesia: Secondary | ICD-10-CM

## 2012-07-06 DIAGNOSIS — I359 Nonrheumatic aortic valve disorder, unspecified: Secondary | ICD-10-CM | POA: Insufficient documentation

## 2012-07-06 HISTORY — PX: TRANSTHORACIC ECHOCARDIOGRAM: SHX275

## 2012-07-06 NOTE — Progress Notes (Signed)
Echocardiogram performed.  

## 2012-07-07 ENCOUNTER — Encounter (HOSPITAL_COMMUNITY): Payer: Self-pay | Admitting: Neurology

## 2012-07-22 DIAGNOSIS — R413 Other amnesia: Secondary | ICD-10-CM

## 2012-07-22 LAB — FUNGUS CULTURE W SMEAR

## 2013-02-08 ENCOUNTER — Ambulatory Visit: Payer: Self-pay | Admitting: Nurse Practitioner

## 2013-03-24 ENCOUNTER — Telehealth: Payer: Self-pay | Admitting: Nurse Practitioner

## 2013-03-24 ENCOUNTER — Ambulatory Visit: Payer: Self-pay | Admitting: Nurse Practitioner

## 2013-04-04 ENCOUNTER — Ambulatory Visit: Payer: Self-pay | Admitting: Nurse Practitioner

## 2013-05-25 ENCOUNTER — Encounter (HOSPITAL_BASED_OUTPATIENT_CLINIC_OR_DEPARTMENT_OTHER): Payer: Self-pay | Admitting: *Deleted

## 2013-05-25 ENCOUNTER — Other Ambulatory Visit: Payer: Self-pay | Admitting: Urology

## 2013-05-25 NOTE — Progress Notes (Signed)
Pt instructed npo p mn 9/15.  To [facility] 9/16 @ 1030.  Needs hgb on arrival.

## 2013-05-31 ENCOUNTER — Encounter (HOSPITAL_BASED_OUTPATIENT_CLINIC_OR_DEPARTMENT_OTHER)
Admission: RE | Disposition: A | Discharge status: Home / Self Care | Payer: Self-pay | Source: Ambulatory Visit | Attending: Urology

## 2013-05-31 ENCOUNTER — Ambulatory Visit (HOSPITAL_BASED_OUTPATIENT_CLINIC_OR_DEPARTMENT_OTHER): Payer: BC Managed Care – PPO | Admitting: Anesthesiology

## 2013-05-31 ENCOUNTER — Ambulatory Visit (HOSPITAL_BASED_OUTPATIENT_CLINIC_OR_DEPARTMENT_OTHER)
Admission: RE | Admit: 2013-05-31 | Discharge: 2013-05-31 | Disposition: A | Discharge status: Home / Self Care | Payer: BC Managed Care – PPO | Source: Ambulatory Visit | Attending: Urology | Admitting: Urology

## 2013-05-31 ENCOUNTER — Encounter (HOSPITAL_BASED_OUTPATIENT_CLINIC_OR_DEPARTMENT_OTHER): Payer: Self-pay | Admitting: Anesthesiology

## 2013-05-31 ENCOUNTER — Encounter (HOSPITAL_BASED_OUTPATIENT_CLINIC_OR_DEPARTMENT_OTHER): Payer: Self-pay | Admitting: *Deleted

## 2013-05-31 DIAGNOSIS — E785 Hyperlipidemia, unspecified: Secondary | ICD-10-CM | POA: Insufficient documentation

## 2013-05-31 DIAGNOSIS — E039 Hypothyroidism, unspecified: Secondary | ICD-10-CM | POA: Insufficient documentation

## 2013-05-31 DIAGNOSIS — N3010 Interstitial cystitis (chronic) without hematuria: Secondary | ICD-10-CM | POA: Insufficient documentation

## 2013-05-31 DIAGNOSIS — K219 Gastro-esophageal reflux disease without esophagitis: Secondary | ICD-10-CM | POA: Insufficient documentation

## 2013-05-31 HISTORY — PX: CYSTO WITH HYDRODISTENSION: SHX5453

## 2013-05-31 LAB — POCT I-STAT 4, (NA,K, GLUC, HGB,HCT)
Glucose, Bld: 80 mg/dL (ref 70–99)
Potassium: 4.6 mEq/L (ref 3.5–5.1)
Sodium: 142 mEq/L (ref 135–145)

## 2013-05-31 SURGERY — CYSTOSCOPY, WITH BLADDER HYDRODISTENSION
Anesthesia: General | Site: Bladder | Wound class: Clean Contaminated

## 2013-05-31 MED ORDER — TRAMADOL HCL 50 MG PO TABS: 50.0000 mg | ORAL_TABLET | Freq: Four times a day (QID) | ORAL | Status: DC | PRN

## 2013-05-31 MED ORDER — FENTANYL CITRATE 0.05 MG/ML IJ SOLN
25.0000 ug | INTRAMUSCULAR | Status: DC | PRN
  Filled 2013-05-31: qty 1

## 2013-05-31 MED ORDER — LIDOCAINE HCL (CARDIAC) 20 MG/ML IV SOLN
INTRAVENOUS | Status: DC | PRN
  Administered 2013-05-31: 60 mg via INTRAVENOUS

## 2013-05-31 MED ORDER — PROMETHAZINE HCL 25 MG/ML IJ SOLN
6.2500 mg | INTRAMUSCULAR | Status: DC | PRN
  Filled 2013-05-31: qty 1

## 2013-05-31 MED ORDER — BELLADONNA ALKALOIDS-OPIUM 16.2-60 MG RE SUPP
RECTAL | Status: DC | PRN
  Administered 2013-05-31: 1 via RECTAL

## 2013-05-31 MED ORDER — FENTANYL CITRATE 0.05 MG/ML IJ SOLN
INTRAMUSCULAR | Status: DC | PRN
  Administered 2013-05-31: 50 ug via INTRAVENOUS

## 2013-05-31 MED ORDER — STERILE WATER FOR IRRIGATION IR SOLN
Status: DC | PRN
  Administered 2013-05-31: 3000 mL via INTRAVESICAL

## 2013-05-31 MED ORDER — LACTATED RINGERS IV SOLN
INTRAVENOUS | Status: DC
  Filled 2013-05-31: qty 1000

## 2013-05-31 MED ORDER — DEXAMETHASONE SODIUM PHOSPHATE 4 MG/ML IJ SOLN
INTRAMUSCULAR | Status: DC | PRN
  Administered 2013-05-31: 10 mg via INTRAVENOUS

## 2013-05-31 MED ORDER — METOCLOPRAMIDE HCL 5 MG/ML IJ SOLN
INTRAMUSCULAR | Status: DC | PRN
  Administered 2013-05-31: 10 mg via INTRAVENOUS

## 2013-05-31 MED ORDER — PROPOFOL 10 MG/ML IV BOLUS
INTRAVENOUS | Status: DC | PRN
  Administered 2013-05-31: 200 mg via INTRAVENOUS

## 2013-05-31 MED ORDER — MIDAZOLAM HCL 5 MG/5ML IJ SOLN
INTRAMUSCULAR | Status: DC | PRN
  Administered 2013-05-31: 1 mg via INTRAVENOUS

## 2013-05-31 MED ORDER — CIPROFLOXACIN HCL 500 MG PO TABS: 500.0000 mg | ORAL_TABLET | Freq: Every day | ORAL | Status: DC

## 2013-05-31 MED ORDER — PHENAZOPYRIDINE HCL 200 MG PO TABS
ORAL | Status: DC | PRN
  Administered 2013-05-31: 13:00:00 via INTRAVESICAL

## 2013-05-31 MED ORDER — LACTATED RINGERS IV SOLN
INTRAVENOUS | Status: DC
  Administered 2013-05-31: 11:00:00 via INTRAVENOUS
  Filled 2013-05-31: qty 1000

## 2013-05-31 MED ORDER — ONDANSETRON HCL 4 MG/2ML IJ SOLN
INTRAMUSCULAR | Status: DC | PRN
  Administered 2013-05-31: 4 mg via INTRAVENOUS

## 2013-05-31 MED ORDER — CIPROFLOXACIN IN D5W 400 MG/200ML IV SOLN
400.0000 mg | INTRAVENOUS | Status: AC
  Administered 2013-05-31: 400 mg via INTRAVENOUS
  Filled 2013-05-31: qty 200

## 2013-05-31 SURGICAL SUPPLY — 17 items
BAG DRAIN URO-CYSTO SKYTR STRL (DRAIN) ×2 IMPLANT
BAG URINE LEG 19OZ MD ST LTX (BAG) ×2 IMPLANT
CANISTER SUCT LVC 12 LTR MEDI- (MISCELLANEOUS) ×2 IMPLANT
CATH FOLEY 2WAY SLVR  5CC 16FR (CATHETERS) ×1
CATH FOLEY 2WAY SLVR 5CC 16FR (CATHETERS) ×1 IMPLANT
CATH ROBINSON RED A/P 16FR (CATHETERS) IMPLANT
CLOTH BEACON ORANGE TIMEOUT ST (SAFETY) ×2 IMPLANT
DRAPE CAMERA CLOSED 9X96 (DRAPES) ×2 IMPLANT
ELECT REM PT RETURN 9FT ADLT (ELECTROSURGICAL)
ELECTRODE REM PT RTRN 9FT ADLT (ELECTROSURGICAL) IMPLANT
GLOVE BIO SURGEON STRL SZ7.5 (GLOVE) ×2 IMPLANT
GOWN STRL NON-REIN LRG LVL3 (GOWN DISPOSABLE) ×2 IMPLANT
GOWN STRL REIN XL XLG (GOWN DISPOSABLE) ×2 IMPLANT
NEEDLE HYPO 22GX1.5 SAFETY (NEEDLE) IMPLANT
NS IRRIG 500ML POUR BTL (IV SOLUTION) IMPLANT
PACK CYSTOSCOPY (CUSTOM PROCEDURE TRAY) ×2 IMPLANT
WATER STERILE IRR 3000ML UROMA (IV SOLUTION) ×2 IMPLANT

## 2013-05-31 NOTE — H&P (Signed)
H&P    History of Present Illness: Pt presents for cystoscopy, hydrodistention and instillation of pyridium, marcaine. Currently, she has bladder pain and mild dysuria c/w prior IC flares. She has had some enuresis but no daytime incontinence. Also, denies double vision, numbness or tingling of hands/fingers, weakness in her arms or legs. No F/C, N/V.   No fevers, chills, nausea or emesis.   Neuro review -  Past GU Hx:     Kaitlyn [name] is a patient of Dr. Belva Crome w/ GU hx of IC.  Her IC symptoms are dysuria and bladder pain.  Pyridium PRN improves dysuria and hydrodistention of bladder (HOD) is usually effective for bladder pain. She cannot tolerate bladder instillation w/ medications because she had a "blackout" when she underwent Marcaine bladder instillation many years ago.  Therefore, occasional HODs are her usual treatments for IC flare ups. Her last HOD was in 2012 when she was last seen.  Per patient, she usually develops urinary retention s/p HOD and usually was sent home w/ Foley catheter which she removes on her own at home 3-4 days later.   She also takes Elmiron and was given refill during 2012 office visit.      She can have some urine leakage after she voids and intermittent frequency and nocturia x2.  She has some new SUI and some UUI.  She denies hematuria.   Kaitlyn [name] was seen last week and c/o 1-week onset of intermittent loss control of bladder 3x in her sleep associated w/ bladder pain. The incontinence is not new for her but she cannot recall if she had ever lost urine control while sleeping and the incontinence seems worse when it's associated w/ bladder pain.  She describes the bladder pain as localized in SP area, intermittent but constant at its onset with 2-3 days duration, 5/10 in severity.  She also experiences intermittent dysuria which improves w/ pyridium.  For the past 2 yrs her IC symptoms have been stable w/ occasional dysuria and bladder pain but dysuria usually resoved  w/ just pyridium and bladder pain usually resolves w/o intervention- until the past week.  She is no longer taking Elmiron because her IC symptoms have not been significant.   Past Medical History  Diagnosis Date  . Interstitial cystitis   . Incontinence of urine   . Frequency of urination   . Urgency of urination   . Pelvic pain syndrome I.C.  . Bipolar affective disorder   . Hyperlipemia   . GERD (gastroesophageal reflux disease)     CONTROLLED W/ PRILOSEC  . Hypothyroidism   . Depression   . Insomnia   . PONV (postoperative nausea and vomiting)    Past Surgical History  Procedure Laterality Date  . Vaginal hysterectomy  1980'S  . Cysto/ hod/ instillation therapy  LAST ONE 08-27-2010    MULTIPLE TIMES  . Cysto with hydrodistension  09/18/2011    Procedure: CYSTOSCOPY/HYDRODISTENSION;  Surgeon: Anner Crete;  Location: [facility];  Service: Urology;  Laterality: N/A;  Instillation of Marcaine & Pyridium    Home Medications:  No prescriptions prior to admission   Allergies:  Allergies  Allergen Reactions  . Aspirin Other (See Comments)    Gi upset  . Sulfa Antibiotics Rash  . Trilafon [Perphenazine] Rash    TRILA    History reviewed. No pertinent family history. Social History:  reports that she has been smoking Cigarettes.  She has a 6.5 pack-year smoking history. She does not have any  smokeless tobacco history on file. She reports that she does not drink alcohol. Her drug history is not on file.  ROS: A complete review of systems was performed.  All systems are negative except for pertinent findings as noted. @ROS @   Physical Exam:  Vital signs in last 24 hours:   General:  Alert and oriented, No acute distress HEENT: Normocephalic, atraumatic Neck: No JVD or lymphadenopathy Cardiovascular: Regular rate and rhythm Lungs: Regular rate and effort Abdomen: Soft, nontender, nondistended, no abdominal masses Back: No CVA tenderness Extremities: No  edema Neurologic: Grossly intact  Laboratory Data:  No results found for this or any previous visit (from the past 24 hour(s)). No results found for this or any previous visit (from the past 240 hour(s)). Creatinine: No results found for this basename: CREATININE,  in the last 168 hours  Impression/Assessment/Plan:  I discussed with the patient the nature, potential benefits, risks and alternatives to cystoscopy, hydrodistention, including side effects of the proposed treatment, the likelihood of the patient achieving the goals of the procedure, and any potential problems that might occur during the procedure or recuperation. All questions answered. Patient elects to proceed. She also wants to go home with foley and remove it by "cutting it" in 2 days.    Antony Haste

## 2013-05-31 NOTE — Transfer of Care (Signed)
Immediate Anesthesia Transfer of Care Note  Patient: Kaitlyn [name]  Procedure(s) Performed: Procedure(s) (LRB): CYSTOSCOPY/HYDRODISTENSION INSTALLATION OF MARCAINE/PYRIDIUM (N/A)  Patient Location: PACU  Anesthesia Type: General  Level of Consciousness: awake, alert  and oriented  Airway & Oxygen Therapy: Patient Spontanous Breathing and Patient connected to face mask oxygen  Post-op Assessment: Report given to PACU RN and Post -op Vital signs reviewed and stable  Post vital signs: Reviewed and stable  Complications: No apparent anesthesia complications

## 2013-05-31 NOTE — Op Note (Signed)
Preoperative diagnosis: Interstitial cystitis Postoperative diagnosis: Interstitial cystitis  Procedure: Cystoscopy with hydrodistention Exam under anesthesia  Surgeon: Aldan Camey Type of anesthesia: Gen.  Findings: The urethra was normal. The trigone and ureteral orifices  were normal in their orthotopic position with clear efflux. The bladder mucosa was normal without lesion mass or erythema. There were no foreign bodies or stones in the bladder.  Bladder capacity: 550 cc  An exam under anesthesia revealed a palpably normal bladder and urethra without mass. There were no vaginal masses palpated and the apex was closed consistent with a prior hysterectomy. The introitus and vulva appeared normal without lesion.  Description of procedure: After consent was obtained patient brought to the operating room. After adequate anesthesia she was placed in lithotomy position and prepped and draped in the usual fashion. A timeout was performed to confirm the patient and procedure. A cystoscope was passed per urethra and the bladder examined with a 12 and 70 lens. The bladder was then distended 30 inches of water pressure with a capacity of 550 cc noted  After the bladder was drained. The bladder was filled again with a capacity with a 3 minute hydrodistention. The bladder was drained with equal return and again 550 cc capacity. The bladder was slightly filled to allow 16 French Foley to be placed.   Marcaine and pyridium 10 cc instilled. The Foley was plugged and will be reconnected to gravity drainage and 35 minutes. Patient was awakened taken to recovery room in stable condition.  Complications: None Blood loss: Minimal Specimens: None Drains: 16 French Foley  Disposition: Patient stable to PACU

## 2013-05-31 NOTE — Anesthesia Preprocedure Evaluation (Addendum)
Anesthesia Evaluation  Patient identified by MRN, date of birth, ID band Patient awake    Reviewed: Allergy & Precautions, H&P , NPO status , Patient's Chart, lab work & pertinent test results, reviewed documented beta blocker date and time   History of Anesthesia Complications (+) PONV  Airway Mallampati: II  Neck ROM: Full  Mouth opening: Limited Mouth Opening  Dental  (+) Partial Upper   Pulmonary neg pulmonary ROS, Current Smoker,  breath sounds clear to auscultation        Cardiovascular - CAD negative cardio ROS  Rhythm:Regular Rate:Normal  Denies cardiac symptoms   Neuro/Psych Bipolar Disorder negative neurological ROS     GI/Hepatic negative GI ROS, Neg liver ROS, GERD-  Medicated,  Endo/Other  Hypothyroidism Thyroid replacement  Renal/GU negative Renal ROS  negative genitourinary   Musculoskeletal negative musculoskeletal ROS (+)   Abdominal   Peds negative pediatric ROS (+)  Hematology negative hematology ROS (+)   Anesthesia Other Findings   Reproductive/Obstetrics negative OB ROS                           Anesthesia Physical Anesthesia Plan  ASA: II  Anesthesia Plan: General   Post-op Pain Management:    Induction:   Airway Management Planned: LMA  Additional Equipment:   Intra-op Plan:   Post-operative Plan: Extubation in OR  Informed Consent: I have reviewed the patients History and Physical, chart, labs and discussed the procedure including the risks, benefits and alternatives for the proposed anesthesia with the patient or authorized representative who has indicated his/her understanding and acceptance.   Dental advisory given  Plan Discussed with: CRNA  Anesthesia Plan Comments:         Anesthesia Quick Evaluation

## 2013-05-31 NOTE — Anesthesia Procedure Notes (Signed)
Procedure Name: LMA Insertion Date/Time: 05/31/2013 12:35 PM Performed by: Norva Pavlov Pre-anesthesia Checklist: Patient identified, Emergency Drugs available, Suction available and Patient being monitored Patient Re-evaluated:Patient Re-evaluated prior to inductionOxygen Delivery Method: Circle System Utilized Preoxygenation: Pre-oxygenation with 100% oxygen Intubation Type: IV induction Ventilation: Mask ventilation without difficulty LMA: LMA inserted LMA Size: 4.0 Number of attempts: 1 Airway Equipment and Method: bite block Placement Confirmation: positive ETCO2 Tube secured with: Tape Dental Injury: Teeth and Oropharynx as per pre-operative assessment

## 2013-06-01 ENCOUNTER — Encounter (HOSPITAL_BASED_OUTPATIENT_CLINIC_OR_DEPARTMENT_OTHER): Payer: Self-pay | Admitting: Urology

## 2013-06-01 NOTE — Anesthesia Postprocedure Evaluation (Signed)
Anesthesia Post Note  Patient: Kaitlyn [name]  Procedure(s) Performed: Procedure(s) (LRB): CYSTOSCOPY/HYDRODISTENSION INSTALLATION OF MARCAINE/PYRIDIUM (N/A)  Anesthesia type: General  Patient location: PACU  Post pain: Pain level controlled  Post assessment: Post-op Vital signs reviewed  Last Vitals:  Filed Vitals:   05/31/13 1430  BP: 148/93  Pulse: 88  Temp: 36.3 C  Resp: 16    Post vital signs: Reviewed  Level of consciousness: sedated  Complications: No apparent anesthesia complications

## 2013-11-24 NOTE — Telephone Encounter (Signed)
Pt was a no show twice, closing encounter

## 2014-01-18 ENCOUNTER — Other Ambulatory Visit: Payer: Self-pay | Admitting: Urology

## 2014-01-23 ENCOUNTER — Encounter (HOSPITAL_BASED_OUTPATIENT_CLINIC_OR_DEPARTMENT_OTHER): Payer: Self-pay | Admitting: *Deleted

## 2014-01-23 NOTE — H&P (Signed)
ctive Problems Problems   1. Bladder pain (788.99)  2. Chronic interstitial cystitis without hematuria (595.1)  History of Present Illness      Kaitlyn [name] returns today with recurrent symptoms which have been present over the last 3-4 months.  She has a history of  IC but is just on pyridium.  She is drinking 3 cups of decaf coffee daily.   She has required occasional HOD's.  She is primary having moderate pain with a full bladder and dysuria.  She feels bloated and has symptoms almost daily.   She has intermittant frequency and nocturia x2.  She has stable SUI and some UUI.  She denies hematuria.  She was treated for a UTI 3 months ago but her UA today looks ok.  She has no associated signs or symptoms.   Past Medical History Problems   1. History of Anxiety (300.00)  2. History of Arthritis (V13.4)  3. History of Chronic interstitial cystitis without hematuria (595.1)  4. History of depression (V11.8)  5. History of esophageal reflux (V12.79)  6. History of hypercholesterolemia (V12.29)  7. History of hypothyroidism (V12.29)  8. History of Urinary retention (788.20)  Surgical History Problems   1. History of Bladder Irrigation  2. History of Bladder Irrigation  3. History of Bladder Irrigation  4. History of Bladder Irrigation  5. History of Bladder Surgery  6. History of Cystoscopy With Dilation Of Bladder  7. History of Cystoscopy With Dilation Of Bladder  8. History of Cystoscopy With Dilation Of Bladder  9. History of Cystoscopy With Dilation Of Bladder  10. History of Cystoscopy With Dilation Of Bladder  11. History of Hysterectomy  12. History of Tonsillectomy  Current Meds  1. Diazepam 5 MG Oral Tablet;  Therapy: 01Dec2012 to Recorded  2. Divalproex Sodium 250 MG Oral Tablet Delayed Release;  Therapy: 19Jan2015 to Recorded  3. Hydrocodone-Acetaminophen 5-325 MG Oral Tablet; 1 - 2 po q 4-6 hours prn pain;  Therapy: 18Jun2010 to (Last Rx:13Dec2012) Ordered  4.  Levothyroxine Sodium 88 MCG Oral Tablet;  Therapy: (Recorded:04May2015) to Recorded  5. Lexapro 20 MG Oral Tablet;  Therapy: 20Jun2012 to Recorded  6. Phenazopyridine HCl - 200 MG Oral Tablet; TAKE 1 TABLET 3 TIMES A  DAY;  Therapy: 20Feb2008 to (Evaluate:02Dec2012); Last Rx:08Dec2011 Ordered  7. Pyridium 200 MG Oral Tablet; TAKE 1 TABLET 3 TIMES DAILY AS NEEDED FOR PAIN;  Therapy: 02Mar2015 to (Evaluate:22Mar2015)  Requested for: 02Mar2015; Last  Rx:02Mar2015 Ordered  Allergies Medication   1. Sulfa Drugs  Family History Problems   1. Family history of Breast Cancer (V16.3)   aunt  2. Family history of Family Health Status Number Of Children   1 daughter  3. Family history of Lung Cancer (V16.1) : Mother  4. Family history of Lung Cancer (V16.1) : Father  5. Family history of Nephrolithiasis : Brother  Social History Problems   1. Denied: Alcohol Use  2. Caffeine Use   2 cps coffee a day  3. Family history of Death In The Family Father   age 58 lung cancer  4. Family history of Death In The Family Mother   age 58 lung cancer  5. Marital History - Currently Married  6. Occupation:   [employer]  7. Tobacco Use (V15.82)   1/2 pack, 10 years   Past and social history reviewed and updated.   Review of Systems  Genitourinary: urinary frequency, nocturia, pelvic pain and suprapubic pain.  Gastrointestinal: no diarrhea and no constipation.  Constitutional: no fever and no recent weight loss.  Cardiovascular: no chest pain.  Respiratory: no shortness of breath.    Vitals Vital Signs [Data Includes: Last 1 Day]  Recorded: 04May2015 02:04PM  Weight: 125 lb  BMI Calculated: 25.68 BSA Calculated: 1.5 Blood Pressure: 110 / 88 Temperature: 97.7 F Heart Rate: 80  Physical Exam Constitutional: Well nourished and well developed . No acute distress.  Pulmonary: No respiratory distress and normal respiratory rhythm and effort.  Cardiovascular: Heart rate and rhythm are  normal . No peripheral edema.    Results/Data Urine [Data Includes: Last 1 Day]   04May2015  COLOR YELLOW   APPEARANCE CLEAR   SPECIFIC GRAVITY 1.010   pH 7.5   GLUCOSE NEG mg/dL  BILIRUBIN NEG   KETONE NEG mg/dL  BLOOD TRACE   PROTEIN NEG mg/dL  UROBILINOGEN 0.2 mg/dL  NITRITE NEG   LEUKOCYTE ESTERASE TRACE   SQUAMOUS EPITHELIAL/HPF MODERATE   WBC 0-2 WBC/hpf  RBC 0-2 RBC/hpf  BACTERIA RARE   CRYSTALS NONE SEEN   CASTS NONE SEEN    Assessment Assessed   1. Chronic interstitial cystitis without hematuria (595.1)  2. Bladder pain (788.99)   She has increased symptoms and feels like she is due for cystoscopy and HOD.   Plan Chronic interstitial cystitis without hematuria   1. Follow-up Schedule Surgery Office  Follow-up  Status: Hold For - Appointment   Requested for: 04May2015  2. Follow-up Schedule Surgery Office  Follow-up  Status: Hold For - Appointment   Requested for: 04May2015  3. URINE CULTURE; Status:Hold For - Specimen/Data Collection,Appointment; Requested  for:04May2015;  Health Maintenance   4. UA With REFLEX; [Do Not Release]; Status:Resulted - Requires Verification;   Done:  04May2015 01:34PM   Urine culture today. I have encouraged her to cut out the coffee. I have reviewed the risks of the procedure and will get her set up in the near future.   Discussion/Summary  CC: Dr. Burnell BlanksMaura Hamrick.

## 2014-01-23 NOTE — Progress Notes (Signed)
NPO AFTER MN.  ARRIVE AT 0600.  NEEDS HG.  

## 2014-01-24 ENCOUNTER — Encounter (HOSPITAL_BASED_OUTPATIENT_CLINIC_OR_DEPARTMENT_OTHER)
Admission: RE | Disposition: A | Discharge status: Home / Self Care | Payer: Self-pay | Source: Ambulatory Visit | Attending: Urology

## 2014-01-24 ENCOUNTER — Encounter (HOSPITAL_BASED_OUTPATIENT_CLINIC_OR_DEPARTMENT_OTHER): Payer: BC Managed Care – PPO | Admitting: Anesthesiology

## 2014-01-24 ENCOUNTER — Encounter (HOSPITAL_BASED_OUTPATIENT_CLINIC_OR_DEPARTMENT_OTHER): Payer: Self-pay

## 2014-01-24 ENCOUNTER — Ambulatory Visit (HOSPITAL_BASED_OUTPATIENT_CLINIC_OR_DEPARTMENT_OTHER)
Admission: RE | Admit: 2014-01-24 | Discharge: 2014-01-24 | Disposition: A | Discharge status: Home / Self Care | Payer: BC Managed Care – PPO | Source: Ambulatory Visit | Attending: Urology | Admitting: Urology

## 2014-01-24 ENCOUNTER — Ambulatory Visit (HOSPITAL_BASED_OUTPATIENT_CLINIC_OR_DEPARTMENT_OTHER): Payer: BC Managed Care – PPO | Admitting: Anesthesiology

## 2014-01-24 DIAGNOSIS — M129 Arthropathy, unspecified: Secondary | ICD-10-CM | POA: Insufficient documentation

## 2014-01-24 DIAGNOSIS — Z79899 Other long term (current) drug therapy: Secondary | ICD-10-CM | POA: Insufficient documentation

## 2014-01-24 DIAGNOSIS — K219 Gastro-esophageal reflux disease without esophagitis: Secondary | ICD-10-CM | POA: Insufficient documentation

## 2014-01-24 DIAGNOSIS — F329 Major depressive disorder, single episode, unspecified: Secondary | ICD-10-CM | POA: Insufficient documentation

## 2014-01-24 DIAGNOSIS — F17200 Nicotine dependence, unspecified, uncomplicated: Secondary | ICD-10-CM | POA: Insufficient documentation

## 2014-01-24 DIAGNOSIS — E7800 Pure hypercholesterolemia, unspecified: Secondary | ICD-10-CM | POA: Insufficient documentation

## 2014-01-24 DIAGNOSIS — N3010 Interstitial cystitis (chronic) without hematuria: Secondary | ICD-10-CM | POA: Insufficient documentation

## 2014-01-24 DIAGNOSIS — F411 Generalized anxiety disorder: Secondary | ICD-10-CM | POA: Insufficient documentation

## 2014-01-24 DIAGNOSIS — E039 Hypothyroidism, unspecified: Secondary | ICD-10-CM | POA: Insufficient documentation

## 2014-01-24 DIAGNOSIS — F3289 Other specified depressive episodes: Secondary | ICD-10-CM | POA: Insufficient documentation

## 2014-01-24 HISTORY — PX: CYSTO WITH HYDRODISTENSION: SHX5453

## 2014-01-24 LAB — POCT HEMOGLOBIN-HEMACUE: HEMOGLOBIN: 12.6 g/dL (ref 12.0–15.0)

## 2014-01-24 SURGERY — CYSTOSCOPY, WITH BLADDER HYDRODISTENSION
Anesthesia: General | Site: Bladder

## 2014-01-24 MED ORDER — OXYCODONE HCL 5 MG PO TABS
5.0000 mg | ORAL_TABLET | ORAL | Status: DC | PRN
  Filled 2014-01-24: qty 2

## 2014-01-24 MED ORDER — BUPIVACAINE HCL (PF) 0.5 % IJ SOLN
INTRAMUSCULAR | Status: DC | PRN
  Administered 2014-01-24: 08:00:00 via INTRAVESICAL

## 2014-01-24 MED ORDER — ACETAMINOPHEN 325 MG PO TABS
650.0000 mg | ORAL_TABLET | ORAL | Status: DC | PRN
  Filled 2014-01-24: qty 2

## 2014-01-24 MED ORDER — CIPROFLOXACIN IN D5W 400 MG/200ML IV SOLN
400.0000 mg | INTRAVENOUS | Status: AC
  Administered 2014-01-24: 400 mg via INTRAVENOUS
  Filled 2014-01-24: qty 200

## 2014-01-24 MED ORDER — ACETAMINOPHEN 650 MG RE SUPP
650.0000 mg | RECTAL | Status: DC | PRN
  Filled 2014-01-24: qty 1

## 2014-01-24 MED ORDER — SODIUM CHLORIDE 0.9 % IJ SOLN
3.0000 mL | Freq: Two times a day (BID) | INTRAMUSCULAR | Status: DC
  Filled 2014-01-24: qty 3

## 2014-01-24 MED ORDER — HYDROCODONE-ACETAMINOPHEN 5-325 MG PO TABS: 1.0000 | ORAL_TABLET | Freq: Four times a day (QID) | ORAL | Status: DC | PRN

## 2014-01-24 MED ORDER — PROPOFOL 10 MG/ML IV BOLUS
INTRAVENOUS | Status: DC | PRN
  Administered 2014-01-24: 140 mg via INTRAVENOUS

## 2014-01-24 MED ORDER — DEXAMETHASONE SODIUM PHOSPHATE 4 MG/ML IJ SOLN
INTRAMUSCULAR | Status: DC | PRN
  Administered 2014-01-24: 10 mg via INTRAVENOUS

## 2014-01-24 MED ORDER — MIDAZOLAM HCL 5 MG/5ML IJ SOLN
INTRAMUSCULAR | Status: DC | PRN
  Administered 2014-01-24: 1 mg via INTRAVENOUS

## 2014-01-24 MED ORDER — STERILE WATER FOR IRRIGATION IR SOLN
Status: DC | PRN
  Administered 2014-01-24: 3000 mL

## 2014-01-24 MED ORDER — FENTANYL CITRATE 0.05 MG/ML IJ SOLN
25.0000 ug | INTRAMUSCULAR | Status: DC | PRN
  Filled 2014-01-24: qty 1

## 2014-01-24 MED ORDER — PENTOSAN POLYSULFATE SODIUM 100 MG PO CAPS: 100.0000 mg | ORAL_CAPSULE | Freq: Three times a day (TID) | ORAL | Status: DC

## 2014-01-24 MED ORDER — SODIUM CHLORIDE 0.9 % IJ SOLN
3.0000 mL | INTRAMUSCULAR | Status: DC | PRN
  Filled 2014-01-24: qty 3

## 2014-01-24 MED ORDER — PHENAZOPYRIDINE HCL 200 MG PO TABS: 200.0000 mg | ORAL_TABLET | Freq: Three times a day (TID) | ORAL | Status: DC | PRN

## 2014-01-24 MED ORDER — LACTATED RINGERS IV SOLN
INTRAVENOUS | Status: DC
  Administered 2014-01-24: 07:00:00 via INTRAVENOUS
  Filled 2014-01-24: qty 1000

## 2014-01-24 MED ORDER — FENTANYL CITRATE 0.05 MG/ML IJ SOLN
INTRAMUSCULAR | Status: DC | PRN
  Administered 2014-01-24: 25 ug via INTRAVENOUS
  Administered 2014-01-24: 50 ug via INTRAVENOUS

## 2014-01-24 MED ORDER — SODIUM CHLORIDE 0.9 % IV SOLN
250.0000 mL | INTRAVENOUS | Status: DC | PRN
  Filled 2014-01-24: qty 250

## 2014-01-24 MED ORDER — LIDOCAINE HCL (CARDIAC) 20 MG/ML IV SOLN
INTRAVENOUS | Status: DC | PRN
  Administered 2014-01-24: 50 mg via INTRAVENOUS

## 2014-01-24 MED ORDER — ONDANSETRON HCL 4 MG/2ML IJ SOLN
INTRAMUSCULAR | Status: DC | PRN
  Administered 2014-01-24: 4 mg via INTRAVENOUS

## 2014-01-24 MED ORDER — MIDAZOLAM HCL 2 MG/2ML IJ SOLN
INTRAMUSCULAR | Status: AC
  Filled 2014-01-24: qty 2

## 2014-01-24 MED ORDER — FENTANYL CITRATE 0.05 MG/ML IJ SOLN
INTRAMUSCULAR | Status: AC
  Filled 2014-01-24: qty 4

## 2014-01-24 SURGICAL SUPPLY — 21 items
BAG DRAIN URO-CYSTO SKYTR STRL (DRAIN) ×3 IMPLANT
CANISTER SUCT LVC 12 LTR MEDI- (MISCELLANEOUS) ×3 IMPLANT
CATH FOLEY 2WAY SLVR  5CC 16FR (CATHETERS) ×2
CATH FOLEY 2WAY SLVR 5CC 16FR (CATHETERS) ×1 IMPLANT
CATH ROBINSON RED A/P 16FR (CATHETERS) ×3 IMPLANT
CLOTH BEACON ORANGE TIMEOUT ST (SAFETY) ×3 IMPLANT
DRAPE CAMERA CLOSED 9X96 (DRAPES) ×3 IMPLANT
ELECT REM PT RETURN 9FT ADLT (ELECTROSURGICAL)
ELECTRODE REM PT RTRN 9FT ADLT (ELECTROSURGICAL) IMPLANT
GLOVE BIO SURGEON STRL SZ7.5 (GLOVE) ×3 IMPLANT
GLOVE INDICATOR 7.5 STRL GRN (GLOVE) ×6 IMPLANT
GLOVE SURG SS PI 8.0 STRL IVOR (GLOVE) ×3 IMPLANT
GOWN STRL REIN XL XLG (GOWN DISPOSABLE) IMPLANT
GOWN STRL REUS W/ TWL XL LVL3 (GOWN DISPOSABLE) ×1 IMPLANT
GOWN STRL REUS W/TWL XL LVL3 (GOWN DISPOSABLE) ×5 IMPLANT
NDL SAFETY ECLIPSE 18X1.5 (NEEDLE) ×1 IMPLANT
NEEDLE HYPO 18GX1.5 SHARP (NEEDLE) ×2
NS IRRIG 500ML POUR BTL (IV SOLUTION) IMPLANT
PACK CYSTOSCOPY (CUSTOM PROCEDURE TRAY) ×3 IMPLANT
SYR 30ML LL (SYRINGE) ×3 IMPLANT
WATER STERILE IRR 3000ML UROMA (IV SOLUTION) ×3 IMPLANT

## 2014-01-24 NOTE — Interval H&P Note (Signed)
History and Physical Interval Note:  01/24/2014 7:15 AM  Kaitlyn [name]  has presented today for surgery, with the diagnosis of INTERSTITIAL CYSTITIS  The various methods of treatment have been discussed with the patient and family. After consideration of risks, benefits and other options for treatment, the patient has consented to  Procedure(s): CYSTOSCOPY/HYDRODISTENSION INSTALLATION OF MARCAINE AND PYRIDIUM (N/A) as a surgical intervention .  The patient's history has been reviewed, patient examined, no change in status, stable for surgery.  I have reviewed the patient's chart and labs.  Questions were answered to the patient's satisfaction.     Bjorn PippinJohn Nikki Glanzer

## 2014-01-24 NOTE — Discharge Instructions (Signed)
Cystoscopy, Care After °Refer to this sheet in the next few weeks. These instructions provide you with information on caring for yourself after your procedure. Your caregiver may also give you more specific instructions. Your treatment has been planned according to current medical practices, but problems sometimes occur. Call your caregiver if you have any problems or questions after your procedure. °HOME CARE INSTRUCTIONS  °Things you can do to ease any discomfort after your procedure include: °· Drinking enough water and fluids to keep your urine clear or pale yellow. °· Taking a warm bath to relieve any burning feelings. °SEEK IMMEDIATE MEDICAL CARE IF:  °· You have an increase in blood in your urine. °· You notice blood clots in your urine. °· You have difficulty passing urine. °· You have the chills. °· You have abdominal pain. °· You have a fever or persistent symptoms for more than 2 3 days. °· You have a fever and your symptoms suddenly get worse. °MAKE SURE YOU:  °· Understand these instructions. °· Will watch your condition. °· Will get help right away if you are not doing well or get worse. °Document Released: 03/21/2005 Document Revised: 05/04/2013 Document Reviewed: 02/23/2012 °[employer]® Patient Information ©2014 [employer]. ° °Post Anesthesia Home Care Instructions ° °Activity: °Get plenty of rest for the remainder of the day. A responsible adult should stay with you for 24 hours following the procedure.  °For the next 24 hours, DO NOT: °-Drive a car °-Operate machinery °-Drink alcoholic beverages °-Take any medication unless instructed by your physician °-Make any legal decisions or sign important papers. ° °Meals: °Start with liquid foods such as gelatin or soup. Progress to regular foods as tolerated. Avoid greasy, spicy, heavy foods. If nausea and/or vomiting occur, drink only clear liquids until the nausea and/or vomiting subsides. Call your physician if vomiting continues. ° °Special  Instructions/Symptoms: °Your throat may feel dry or sore from the anesthesia or the breathing tube placed in your throat during surgery. If this causes discomfort, gargle with warm salt water. The discomfort should disappear within 24 hours. ° °

## 2014-01-24 NOTE — Transfer of Care (Signed)
Immediate Anesthesia Transfer of Care Note  Patient: Kaitlyn LowensteinDebra K [name]  Procedure(s) Performed: Procedure(s) (LRB): CYSTOSCOPY/HYDRODISTENSION INSTALLATION OF MARCAINE AND PYRIDIUM (N/A)  Patient Location: PACU  Anesthesia Type: General  Level of Consciousness: awake, oriented, sedated and patient cooperative  Airway & Oxygen Therapy: Patient Spontanous Breathing and Patient connected to face mask oxygen  Post-op Assessment: Report given to PACU RN and Post -op Vital signs reviewed and stable  Post vital signs: Reviewed and stable  Complications: No apparent anesthesia complications

## 2014-01-24 NOTE — Brief Op Note (Signed)
01/24/2014  7:45 AM  PATIENT:  Kaitlyn [name]  58 y.o. female  PRE-OPERATIVE DIAGNOSIS:  INTERSTITIAL CYSTITIS  POST-OPERATIVE DIAGNOSIS: Same  PROCEDURE:  Procedure(s): CYSTOSCOPY/HYDRODISTENSION INSTALLATION OF MARCAINE AND PYRIDIUM (N/A)  SURGEON:  Surgeon(s) and Role:    * Bjorn PippinJohn Remas Sobel, MD - Primary  PHYSICIAN ASSISTANT:   ASSISTANTS: none   ANESTHESIA:   general  EBL:  Total I/O In: 100 [I.V.:100] Out: -   BLOOD ADMINISTERED:none  DRAINS: Urinary Catheter (Foley)   LOCAL MEDICATIONS USED:  MARCAINE     SPECIMEN:  No Specimen  DISPOSITION OF SPECIMEN:  N/A  COUNTS:  YES  TOURNIQUET:  * No tourniquets in log *  DICTATION: .Other Dictation: Dictation Number [id number]  PLAN OF CARE: Discharge to home after PACU  PATIENT DISPOSITION:  PACU - hemodynamically stable.   Delay start of Pharmacological VTE agent (>24hrs) due to surgical blood loss or risk of bleeding: not applicable

## 2014-01-24 NOTE — Anesthesia Preprocedure Evaluation (Addendum)
Anesthesia Evaluation  Patient identified by MRN, date of birth, ID band Patient awake    Reviewed: Allergy & Precautions, H&P , NPO status , Patient's Chart, lab work & pertinent test results  History of Anesthesia Complications (+) PONV and history of anesthetic complications  Airway Mallampati: II  TM Distance: >3 FB Neck ROM: Full    Dental  (+) Upper Dentures   Pulmonary Current Smoker,  breath sounds clear to auscultation  Pulmonary exam normal       Cardiovascular Exercise Tolerance: Good negative cardio ROS  Rhythm:Regular Rate:Normal     Neuro/Psych PSYCHIATRIC DISORDERS Depression negative neurological ROS     GI/Hepatic Neg liver ROS, GERD-  Medicated,  Endo/Other  Hypothyroidism   Renal/GU negative Renal ROS  negative genitourinary   Musculoskeletal negative musculoskeletal ROS (+)   Abdominal   Peds negative pediatric ROS (+)  Hematology negative hematology ROS (+)   Anesthesia Other Findings   Reproductive/Obstetrics negative OB ROS                             Anesthesia Physical  Anesthesia Plan  ASA: II  Anesthesia Plan: General   Post-op Pain Management:    Induction: Intravenous  Airway Management Planned: LMA  Additional Equipment:   Intra-op Plan:   Post-operative Plan: Extubation in OR  Informed Consent: I have reviewed the patients History and Physical, chart, labs and discussed the procedure including the risks, benefits and alternatives for the proposed anesthesia with the patient or authorized representative who has indicated his/her understanding and acceptance.   Dental advisory given  Plan Discussed with: CRNA  Anesthesia Plan Comments:         Anesthesia Quick Evaluation  

## 2014-01-24 NOTE — Anesthesia Procedure Notes (Signed)
Procedure Name: LMA Insertion Date/Time: 01/24/2014 7:27 AM Performed by: Renella CunasHAZEL, Jariyah Hackley D Pre-anesthesia Checklist: Patient identified, Emergency Drugs available, Suction available and Patient being monitored Patient Re-evaluated:Patient Re-evaluated prior to inductionOxygen Delivery Method: Circle System Utilized Preoxygenation: Pre-oxygenation with 100% oxygen Intubation Type: IV induction Ventilation: Mask ventilation without difficulty LMA: LMA inserted LMA Size: 4.0 Number of attempts: 1 Airway Equipment and Method: bite block Placement Confirmation: positive ETCO2 Tube secured with: Tape Dental Injury: Teeth and Oropharynx as per pre-operative assessment

## 2014-01-24 NOTE — Anesthesia Postprocedure Evaluation (Signed)
  Anesthesia Post-op Note  Patient: Kaitlyn [name]  Procedure(s) Performed: Procedure(s) (LRB): CYSTOSCOPY/HYDRODISTENSION INSTALLATION OF MARCAINE AND PYRIDIUM (N/A)  Patient Location: PACU  Anesthesia Type: General  Level of Consciousness: awake and alert   Airway and Oxygen Therapy: Patient Spontanous Breathing  Post-op Pain: mild  Post-op Assessment: Post-op Vital signs reviewed, Patient's Cardiovascular Status Stable, Respiratory Function Stable, Patent Airway and No signs of Nausea or vomiting  Last Vitals:  Filed Vitals:   01/24/14 0915  BP: 139/86  Pulse: 82  Temp: 36.6 C  Resp: 16    Post-op Vital Signs: stable   Complications: No apparent anesthesia complications

## 2014-01-25 ENCOUNTER — Encounter (HOSPITAL_BASED_OUTPATIENT_CLINIC_OR_DEPARTMENT_OTHER): Payer: Self-pay | Admitting: Urology

## 2014-01-25 NOTE — Op Note (Signed)
NAMCrist Infante:  [name], Kaitlyn                  ACCOUNT NO.:  [id number]  MEDICAL RECORD NO.:  [id number]  LOCATION:                                 FACILITY:  PHYSICIAN:  Excell SeltzerJohn J. Annabell HowellsWrenn, M.D.    DATE OF BIRTH:  Feb 20, 1956  DATE OF PROCEDURE:  01/24/2014 DATE OF DISCHARGE:                              OPERATIVE REPORT   PROCEDURES: 1. Cystoscopy with hydrodistention of the bladder. 2. Installation of Pyridium and Marcaine.  PREOPERATIVE DIAGNOSIS:  Interstitial cystitis.  POSTOPERATIVE DIAGNOSIS:  Interstitial cystitis.  SURGEON:  Excell SeltzerJohn J. Annabell HowellsWrenn, MD  ANESTHESIA:  General.  SPECIMEN:  None.  DRAINS:  A 16-French Foley catheter.  COMPLICATIONS:  None.  INDICATIONS:  Kaitlyn [name] is a 58 year old white female with long history of interstitial cystitis, who requires periodic hydrodistention for management.  She returns today for this procedure.  FINDINGS OF PROCEDURE:  She was given Cipro.  She was taken to the operating room where general anesthetic was induced.  She was placed in lithotomy position and fitted with PAS hose.  Her perineum and genitalia were prepped with Betadine solution.  She was draped in usual sterile fashion.  Cystoscopy was performed using a 22-French scope and 12-degree lens. Examination revealed a normal urethra.  The bladder wall was smooth and pale without tumor, stones, or inflammation.  Ureteral orifices were unremarkable.  The bladder was then dilated under 80 cm of water pressure to capacity. The bladder was drained.  Inspection revealed a few glomerulations, consistent with interstitial cystitis.  Her capacity under anesthesia was 600 mL.  The cystoscope was removed.  A 16-French Foley catheter was inserted. The bladder was instilled with 30 mL of 0.25% Marcaine with 400-mg crushed Pyridium.  The catheter was then plugged and left plugged for 15 minutes.  The patient was taken down from lithotomy position.  Her anesthetic was reversed.  He was moved to the  recovery room in stable condition.  There were no complications.     Excell SeltzerJohn J. Annabell HowellsWrenn, M.D.     JJW/MEDQ  D:  01/24/2014  T:  01/25/2014  Job:  [id number]

## 2014-07-26 ENCOUNTER — Other Ambulatory Visit: Payer: Self-pay | Admitting: Urology

## 2014-07-31 ENCOUNTER — Encounter (HOSPITAL_BASED_OUTPATIENT_CLINIC_OR_DEPARTMENT_OTHER): Payer: Self-pay | Admitting: *Deleted

## 2014-07-31 NOTE — Progress Notes (Signed)
NPO AFTER MN WITH EXCEPTION CLEAR LIQUIDS UNTIL 0700 (NO CREAM/MILK PRODUCTS).  ARRIVE AT 1130. NEEDS HG. MAY TAKE HYDROCODONE IF NEEDED AM DOS W/ SIPS OF WATER.

## 2014-08-02 NOTE — Anesthesia Preprocedure Evaluation (Signed)
Anesthesia Evaluation  Patient identified by MRN, date of birth, ID band Patient awake    Reviewed: Allergy & Precautions, H&P , NPO status , Patient's Chart, lab work & pertinent test results  History of Anesthesia Complications (+) PONV and history of anesthetic complications  Airway Mallampati: II  TM Distance: >3 FB Neck ROM: Full    Dental  (+) Upper Dentures   Pulmonary Current Smoker,  breath sounds clear to auscultation  Pulmonary exam normal       Cardiovascular Exercise Tolerance: Good negative cardio ROS  Rhythm:Regular Rate:Normal     Neuro/Psych PSYCHIATRIC DISORDERS Depression negative neurological ROS     GI/Hepatic Neg liver ROS, GERD-  Medicated,  Endo/Other  Hypothyroidism   Renal/GU negative Renal ROS  negative genitourinary   Musculoskeletal negative musculoskeletal ROS (+)   Abdominal   Peds negative pediatric ROS (+)  Hematology negative hematology ROS (+)   Anesthesia Other Findings   Reproductive/Obstetrics negative OB ROS                             Anesthesia Physical  Anesthesia Plan  ASA: II  Anesthesia Plan: General   Post-op Pain Management:    Induction: Intravenous  Airway Management Planned: LMA  Additional Equipment:   Intra-op Plan:   Post-operative Plan: Extubation in OR  Informed Consent: I have reviewed the patients History and Physical, chart, labs and discussed the procedure including the risks, benefits and alternatives for the proposed anesthesia with the patient or authorized representative who has indicated his/her understanding and acceptance.   Dental advisory given  Plan Discussed with: CRNA  Anesthesia Plan Comments:         Anesthesia Quick Evaluation

## 2014-08-02 NOTE — H&P (Signed)
Active Problems Problems  1. Bladder pain (R39.89) 2. Chronic interstitial cystitis without hematuria (N30.10)  History of Present Illness Kaitlyn [name] returns today in f/u. She had a UTI about a month ago and got relief from Cipro.  She started having increased bladder pain last week and had been on pyridium which helped but didn't eliminate her symptoms. She had a hydrodistention in May for her IC. She has had no hematuria. She has some dysuria. Her UA today has 7-10 WBC and 3-6 RBC with bacteria but many epis.  She has had increased nocturia and frequency. She has some incontinence with urgency.   Past Medical History Problems  1. History of Anxiety (F41.9) 2. History of Arthritis 3. History of Chronic interstitial cystitis without hematuria (N30.10) 4. History of depression (Z86.59) 5. History of esophageal reflux (Z87.19) 6. History of hypercholesterolemia (Z86.39) 7. History of hypothyroidism (Z86.39) 8. History of Urinary retention (R33.9)  Surgical History Problems  1. History of Bladder Irrigation 2. History of Bladder Irrigation 3. History of Bladder Irrigation 4. History of Bladder Irrigation 5. History of Bladder Irrigation 6. History of Bladder Surgery 7. History of Cystoscopy With Dilation Of Bladder 8. History of Cystoscopy With Dilation Of Bladder 9. History of Cystoscopy With Dilation Of Bladder 10. History of Cystoscopy With Dilation Of Bladder 11. History of Cystoscopy With Dilation Of Bladder 12. History of Cystoscopy With Dilation Of Bladder 13. History of Hysterectomy 14. History of Tonsillectomy  Current Meds 1. Diazepam 5 MG Oral Tablet;  Therapy: 01Dec2012 to Recorded 2. Divalproex Sodium 250 MG Oral Tablet Delayed Release;  Therapy: 19Jan2015 to Recorded 3. Levothyroxine Sodium 88 MCG Oral Tablet;  Therapy: (Recorded:04May2015) to Recorded 4. Lexapro 20 MG Oral Tablet;  Therapy: 20Jun2012 to Recorded 5. Pyridium 200 MG Oral Tablet; TAKE 1 TABLET 3  TIMES DAILY AS NEEDED FOR PAIN;  Therapy: 02Mar2015 to (Evaluate:22Mar2015)  Requested for: 02Mar2015; Last  Rx:02Mar2015 Ordered  Allergies Medication  1. Sulfa Drugs  Family History Problems  1. Family history of Breast Cancer   aunt 2. Family history of Family Health Status Number Of Children   1 daughter 3. Family history of Lung Cancer : Mother 4. Family history of Lung Cancer : Father 5. Family history of Nephrolithiasis : Brother  Social History Problems  1. Denied: Alcohol Use 2. Caffeine Use   2 cps coffee a day 3. Current every day smoker (F17.200) 4. Family history of Death In The Family Father   age 67 lung cancer 5. Family history of Death In The Family Mother   age 65 lung cancer 6. Marital History - Currently Married 7. Occupation:   [employer] 8. Tobacco Use   1/2 pack, 10 years  Past and social history reviewed and updated.   Review of Systems  Gastrointestinal: no diarrhea and no constipation.  Constitutional: no fever.  Cardiovascular: no chest pain.  Respiratory: no shortness of breath.    Vitals Vital Signs [Data Includes: Last 1 Day]  Recorded: 09Nov2015 03:04PM  Blood Pressure: 122 / 90 Temperature: 98.4 F Heart Rate: 81  Physical Exam Constitutional: Well nourished and well developed . No acute distress.  Pulmonary: No respiratory distress and normal respiratory rhythm and effort.  Cardiovascular: Heart rate and rhythm are normal . No peripheral edema.    Results/Data Urine [Data Includes: Last 1 Day]   09Nov2015  COLOR YELLOW   APPEARANCE CLEAR   SPECIFIC GRAVITY 1.010   pH 7.5   GLUCOSE NEG mg/dL  BILIRUBIN NEG   KETONE   NEG mg/dL  BLOOD TRACE   PROTEIN NEG mg/dL  UROBILINOGEN 0.2 mg/dL  NITRITE NEG   LEUKOCYTE ESTERASE LARGE   SQUAMOUS EPITHELIAL/HPF MANY   WBC 7-10 WBC/hpf  RBC 3-6 RBC/hpf  BACTERIA FEW   CRYSTALS NONE SEEN   CASTS NONE SEEN    Assessment Assessed  1. Chronic interstitial cystitis without  hematuria (N30.10) 2. Bladder pain (R39.89) 3. Urge incontinence of urine (N39.41)  Elias is having an IC flair post UTI.  She also has some UUI with the flair.   Her UA looks contaminated today.   Plan Chronic interstitial cystitis without hematuria  1. URINE CULTURE; Status:Hold For - Specimen/Data Collection,Appointment; Requested  for:09Nov2015;  2. Follow-up Schedule Surgery Office  Follow-up  Status: Hold For - Appointment   Requested for: 09Nov2015 Health Maintenance  3. UA With REFLEX; [Do Not Release]; Status:Resulted - Requires Verification;   Done:  09Nov2015 02:36PM  Urine culture to make sure there is no UTI.  I will set her up for cystoscopy, HOD and instillation of P&M.  She is aware of the risks as this is required about every 6 months.   She goes home with a foley.   Discussion/Summary CC: Dr. Maura Hamrick.    

## 2014-08-03 ENCOUNTER — Ambulatory Visit (HOSPITAL_BASED_OUTPATIENT_CLINIC_OR_DEPARTMENT_OTHER): Payer: BC Managed Care – PPO | Admitting: Anesthesiology

## 2014-08-03 ENCOUNTER — Encounter (HOSPITAL_BASED_OUTPATIENT_CLINIC_OR_DEPARTMENT_OTHER)
Admission: RE | Disposition: A | Discharge status: Home / Self Care | Payer: Self-pay | Source: Ambulatory Visit | Attending: Urology

## 2014-08-03 ENCOUNTER — Ambulatory Visit (HOSPITAL_BASED_OUTPATIENT_CLINIC_OR_DEPARTMENT_OTHER)
Admission: RE | Admit: 2014-08-03 | Discharge: 2014-08-03 | Disposition: A | Discharge status: Home / Self Care | Payer: BC Managed Care – PPO | Source: Ambulatory Visit | Attending: Urology | Admitting: Urology

## 2014-08-03 ENCOUNTER — Encounter (HOSPITAL_BASED_OUTPATIENT_CLINIC_OR_DEPARTMENT_OTHER): Payer: Self-pay | Admitting: *Deleted

## 2014-08-03 DIAGNOSIS — N3941 Urge incontinence: Secondary | ICD-10-CM | POA: Diagnosis not present

## 2014-08-03 DIAGNOSIS — Z818 Family history of other mental and behavioral disorders: Secondary | ICD-10-CM | POA: Insufficient documentation

## 2014-08-03 DIAGNOSIS — R300 Dysuria: Secondary | ICD-10-CM | POA: Diagnosis not present

## 2014-08-03 DIAGNOSIS — E039 Hypothyroidism, unspecified: Secondary | ICD-10-CM | POA: Diagnosis not present

## 2014-08-03 DIAGNOSIS — Z803 Family history of malignant neoplasm of breast: Secondary | ICD-10-CM | POA: Insufficient documentation

## 2014-08-03 DIAGNOSIS — N390 Urinary tract infection, site not specified: Secondary | ICD-10-CM | POA: Insufficient documentation

## 2014-08-03 DIAGNOSIS — R351 Nocturia: Secondary | ICD-10-CM | POA: Diagnosis not present

## 2014-08-03 DIAGNOSIS — Z801 Family history of malignant neoplasm of trachea, bronchus and lung: Secondary | ICD-10-CM | POA: Insufficient documentation

## 2014-08-03 DIAGNOSIS — F418 Other specified anxiety disorders: Secondary | ICD-10-CM | POA: Diagnosis not present

## 2014-08-03 DIAGNOSIS — Z841 Family history of disorders of kidney and ureter: Secondary | ICD-10-CM | POA: Insufficient documentation

## 2014-08-03 DIAGNOSIS — Z8719 Personal history of other diseases of the digestive system: Secondary | ICD-10-CM | POA: Diagnosis not present

## 2014-08-03 DIAGNOSIS — R32 Unspecified urinary incontinence: Secondary | ICD-10-CM | POA: Insufficient documentation

## 2014-08-03 DIAGNOSIS — N3010 Interstitial cystitis (chronic) without hematuria: Secondary | ICD-10-CM | POA: Insufficient documentation

## 2014-08-03 DIAGNOSIS — R3989 Other symptoms and signs involving the genitourinary system: Secondary | ICD-10-CM | POA: Diagnosis not present

## 2014-08-03 DIAGNOSIS — Z8639 Personal history of other endocrine, nutritional and metabolic disease: Secondary | ICD-10-CM | POA: Diagnosis not present

## 2014-08-03 DIAGNOSIS — Z882 Allergy status to sulfonamides status: Secondary | ICD-10-CM | POA: Insufficient documentation

## 2014-08-03 HISTORY — PX: CYSTO WITH HYDRODISTENSION: SHX5453

## 2014-08-03 LAB — POCT HEMOGLOBIN-HEMACUE: Hemoglobin: 15 g/dL (ref 12.0–15.0)

## 2014-08-03 SURGERY — CYSTOSCOPY, WITH BLADDER HYDRODISTENSION
Anesthesia: General | Site: Bladder

## 2014-08-03 MED ORDER — CIPROFLOXACIN IN D5W 400 MG/200ML IV SOLN
400.0000 mg | INTRAVENOUS | Status: AC
  Administered 2014-08-03: 400 mg via INTRAVENOUS
  Filled 2014-08-03: qty 200

## 2014-08-03 MED ORDER — STERILE WATER FOR IRRIGATION IR SOLN
Status: DC | PRN
  Administered 2014-08-03: 3000 mL

## 2014-08-03 MED ORDER — LACTATED RINGERS IV SOLN
INTRAVENOUS | Status: DC | PRN
  Administered 2014-08-03: 07:00:00 via INTRAVENOUS

## 2014-08-03 MED ORDER — MIDAZOLAM HCL 2 MG/2ML IJ SOLN
INTRAMUSCULAR | Status: AC
  Filled 2014-08-03: qty 2

## 2014-08-03 MED ORDER — DEXAMETHASONE SODIUM PHOSPHATE 10 MG/ML IJ SOLN
INTRAMUSCULAR | Status: DC | PRN
  Administered 2014-08-03: 4 mg via INTRAVENOUS

## 2014-08-03 MED ORDER — SODIUM CHLORIDE 0.9 % IJ SOLN
3.0000 mL | Freq: Two times a day (BID) | INTRAMUSCULAR | Status: DC
  Filled 2014-08-03: qty 3

## 2014-08-03 MED ORDER — PROMETHAZINE HCL 25 MG/ML IJ SOLN
6.2500 mg | INTRAMUSCULAR | Status: DC | PRN
  Filled 2014-08-03: qty 1

## 2014-08-03 MED ORDER — ONDANSETRON HCL 4 MG/2ML IJ SOLN
INTRAMUSCULAR | Status: DC | PRN
  Administered 2014-08-03: 4 mg via INTRAVENOUS

## 2014-08-03 MED ORDER — LACTATED RINGERS IV SOLN
INTRAVENOUS | Status: DC
  Administered 2014-08-03: 07:00:00 via INTRAVENOUS
  Filled 2014-08-03: qty 1000

## 2014-08-03 MED ORDER — HYDROCODONE-ACETAMINOPHEN 5-325 MG PO TABS: 1.0000 | ORAL_TABLET | Freq: Four times a day (QID) | ORAL | Status: DC | PRN

## 2014-08-03 MED ORDER — LIDOCAINE HCL (CARDIAC) 20 MG/ML IV SOLN
INTRAVENOUS | Status: DC | PRN
  Administered 2014-08-03: 60 mg via INTRAVENOUS

## 2014-08-03 MED ORDER — PROPOFOL 10 MG/ML IV BOLUS
INTRAVENOUS | Status: DC | PRN
  Administered 2014-08-03: 150 mg via INTRAVENOUS

## 2014-08-03 MED ORDER — SODIUM CHLORIDE 0.9 % IJ SOLN
3.0000 mL | INTRAMUSCULAR | Status: DC | PRN
  Filled 2014-08-03: qty 3

## 2014-08-03 MED ORDER — MIDAZOLAM HCL 5 MG/5ML IJ SOLN
INTRAMUSCULAR | Status: DC | PRN
  Administered 2014-08-03 (×2): 1 mg via INTRAVENOUS

## 2014-08-03 MED ORDER — OXYCODONE HCL 5 MG PO TABS
ORAL_TABLET | ORAL | Status: AC
  Filled 2014-08-03: qty 1

## 2014-08-03 MED ORDER — FENTANYL CITRATE 0.05 MG/ML IJ SOLN
25.0000 ug | INTRAMUSCULAR | Status: DC | PRN
Start: 2014-08-03 — End: 2014-08-03
  Filled 2014-08-03: qty 1

## 2014-08-03 MED ORDER — FENTANYL CITRATE 0.05 MG/ML IJ SOLN
INTRAMUSCULAR | Status: AC
  Filled 2014-08-03: qty 4

## 2014-08-03 MED ORDER — FENTANYL CITRATE 0.05 MG/ML IJ SOLN
INTRAMUSCULAR | Status: DC | PRN
Start: 2014-08-03 — End: 2014-08-03
  Administered 2014-08-03 (×2): 25 ug via INTRAVENOUS

## 2014-08-03 MED ORDER — OXYCODONE HCL 5 MG PO TABS
5.0000 mg | ORAL_TABLET | Freq: Once | ORAL | Status: AC | PRN
  Administered 2014-08-03: 5 mg via ORAL
  Filled 2014-08-03: qty 1

## 2014-08-03 MED ORDER — SODIUM CHLORIDE 0.9 % IV SOLN
250.0000 mL | INTRAVENOUS | Status: DC | PRN
  Filled 2014-08-03: qty 250

## 2014-08-03 MED ORDER — CIPROFLOXACIN IN D5W 400 MG/200ML IV SOLN
INTRAVENOUS | Status: AC
  Filled 2014-08-03: qty 200

## 2014-08-03 MED ORDER — OXYCODONE HCL 5 MG PO TABS
5.0000 mg | ORAL_TABLET | ORAL | Status: DC | PRN
  Filled 2014-08-03: qty 2

## 2014-08-03 MED ORDER — MEPERIDINE HCL 25 MG/ML IJ SOLN
6.2500 mg | INTRAMUSCULAR | Status: DC | PRN
  Filled 2014-08-03: qty 1

## 2014-08-03 MED ORDER — HYDROMORPHONE HCL 1 MG/ML IJ SOLN
0.2500 mg | INTRAMUSCULAR | Status: DC | PRN
  Filled 2014-08-03: qty 1

## 2014-08-03 MED ORDER — ACETAMINOPHEN 650 MG RE SUPP
650.0000 mg | RECTAL | Status: DC | PRN
  Filled 2014-08-03: qty 1

## 2014-08-03 MED ORDER — OXYCODONE HCL 5 MG/5ML PO SOLN
5.0000 mg | Freq: Once | ORAL | Status: AC | PRN
  Filled 2014-08-03: qty 5

## 2014-08-03 MED ORDER — ACETAMINOPHEN 325 MG PO TABS
650.0000 mg | ORAL_TABLET | ORAL | Status: DC | PRN
  Filled 2014-08-03: qty 2

## 2014-08-03 MED ORDER — PHENAZOPYRIDINE HCL 200 MG PO TABS
ORAL | Status: DC | PRN
  Administered 2014-08-03: 15 mL via INTRAVESICAL

## 2014-08-03 SURGICAL SUPPLY — 20 items
BAG DRAIN URO-CYSTO SKYTR STRL (DRAIN) ×2 IMPLANT
CANISTER SUCT LVC 12 LTR MEDI- (MISCELLANEOUS) ×2 IMPLANT
CATH FOLEY 2WAY SLVR  5CC 14FR (CATHETERS) ×1
CATH FOLEY 2WAY SLVR 5CC 14FR (CATHETERS) ×1 IMPLANT
CATH ROBINSON RED A/P 16FR (CATHETERS) ×2 IMPLANT
CLOTH BEACON ORANGE TIMEOUT ST (SAFETY) ×2 IMPLANT
DRAPE CAMERA CLOSED 9X96 (DRAPES) ×2 IMPLANT
ELECT REM PT RETURN 9FT ADLT (ELECTROSURGICAL) ×2
ELECTRODE REM PT RTRN 9FT ADLT (ELECTROSURGICAL) ×1 IMPLANT
GLOVE SURG SS PI 8.0 STRL IVOR (GLOVE) ×2 IMPLANT
GOWN STRL REIN XL XLG (GOWN DISPOSABLE) ×2 IMPLANT
GOWN STRL REUS W/ TWL LRG LVL3 (GOWN DISPOSABLE) ×1 IMPLANT
GOWN STRL REUS W/TWL LRG LVL3 (GOWN DISPOSABLE) ×1
GOWN STRL REUS W/TWL XL LVL3 (GOWN DISPOSABLE) ×2 IMPLANT
NDL SAFETY ECLIPSE 18X1.5 (NEEDLE) ×1 IMPLANT
NEEDLE HYPO 18GX1.5 SHARP (NEEDLE) ×1
NS IRRIG 500ML POUR BTL (IV SOLUTION) IMPLANT
PACK CYSTO (CUSTOM PROCEDURE TRAY) ×2 IMPLANT
SYR 30ML LL (SYRINGE) ×2 IMPLANT
WATER STERILE IRR 3000ML UROMA (IV SOLUTION) ×2 IMPLANT

## 2014-08-03 NOTE — Anesthesia Procedure Notes (Signed)
Procedure Name: LMA Insertion Date/Time: 08/03/2014 8:04 AM Performed by: Jessica PriestBEESON, Kanija Remmel C Pre-anesthesia Checklist: Patient identified, Emergency Drugs available, Suction available and Patient being monitored Patient Re-evaluated:Patient Re-evaluated prior to inductionOxygen Delivery Method: Circle System Utilized Preoxygenation: Pre-oxygenation with 100% oxygen Intubation Type: IV induction Ventilation: Mask ventilation without difficulty LMA: LMA inserted LMA Size: 3.0 Number of attempts: 1 Airway Equipment and Method: bite block Placement Confirmation: positive ETCO2 Tube secured with: Tape Dental Injury: Teeth and Oropharynx as per pre-operative assessment

## 2014-08-03 NOTE — Transfer of Care (Signed)
Immediate Anesthesia Transfer of Care Note  Patient: Rosina LowensteinDebra K Hinote  Procedure(s) Performed: Procedure(s) (LRB): CYSTO/HYDRODISTENSION OF BLADDER/INSTILL  PYRIDIUM/MARCAINE (N/A)  Patient Location: PACU  Anesthesia Type: General  Level of Consciousness: awake, sedated, patient cooperative and responds to stimulation  Airway & Oxygen Therapy: Patient Spontanous Breathing and Patient connected to face mask oxygen  Post-op Assessment: Report given to PACU RN, Post -op Vital signs reviewed and stable and Patient moving all extremities  Post vital signs: Reviewed and stable  Complications: No apparent anesthesia complications

## 2014-08-03 NOTE — Anesthesia Postprocedure Evaluation (Signed)
Anesthesia Post Note  Patient: Kaitlyn [name]  Procedure(s) Performed: Procedure(s) (LRB): CYSTO/HYDRODISTENSION OF BLADDER/INSTILL  PYRIDIUM/MARCAINE (N/A)  Anesthesia type: General  Patient location: PACU  Post pain: Pain level controlled  Post assessment: Post-op Vital signs reviewed  Last Vitals: BP 122/87 mmHg  Pulse 85  Temp(Src) 36.6 C (Oral)  Resp 18  Ht 4\' 10"  (1.473 m)  Wt 131 lb (59.421 kg)  BMI 27.39 kg/m2  SpO2 94%  Post vital signs: Reviewed  Level of consciousness: sedated  Complications: No apparent anesthesia complications

## 2014-08-03 NOTE — Brief Op Note (Signed)
08/03/2014  8:15 AM  PATIENT:  Rosina Lowensteinebra K Mcconaha  58 y.o. female  PRE-OPERATIVE DIAGNOSIS:  INTERSITIAL CYSTITIS  POST-OPERATIVE DIAGNOSIS:  INTERSITIAL CYSTITIS  PROCEDURE:  Procedure(s): CYSTO/HYDRODISTENSION OF BLADDER/INSTILL  PYRIDIUM/MARCAINE (N/A)  SURGEON:  Surgeon(s) and Role:    * Anner CreteJohn J Gatlyn Lipari, MD - Primary  PHYSICIAN ASSISTANT:   ASSISTANTS: none   ANESTHESIA:   general  EBL:  Total I/O In: 100 [I.V.:100] Out: -   BLOOD ADMINISTERED:none  DRAINS: Urinary Catheter (Foley)   LOCAL MEDICATIONS USED:  MARCAINE    and Amount: 30 ml 0.25%  SPECIMEN:  No Specimen  DISPOSITION OF SPECIMEN:  N/A  COUNTS:  YES  TOURNIQUET:  * No tourniquets in log *  DICTATION: .Other Dictation: Dictation Number [id number]  PLAN OF CARE: Discharge to home after PACU  PATIENT DISPOSITION:  PACU - hemodynamically stable.   Delay start of Pharmacological VTE agent (>24hrs) due to surgical blood loss or risk of bleeding: not applicable

## 2014-08-03 NOTE — Interval H&P Note (Signed)
History and Physical Interval Note:  08/03/2014 7:10 AM  Kaitlyn [name]  has presented today for surgery, with the diagnosis of INTERSITIAL CYSTITIS  The various methods of treatment have been discussed with the patient and family. After consideration of risks, benefits and other options for treatment, the patient has consented to  Procedure(s): CYSTO/HYDRODISTENSION OF BLADDER/INSTILL  PYRIDIUM/MARCAINE (N/A) as a surgical intervention .  The patient's history has been reviewed, patient examined, no change in status, stable for surgery.  I have reviewed the patient's chart and labs.  Questions were answered to the patient's satisfaction.     [name]

## 2014-08-03 NOTE — Op Note (Signed)
NAMDione [name]:  Segel, DEBORAH                ACCOUNT NO.:  [id number]  MEDICAL RECORD NO.:  [id number]  LOCATION:                                 FACILITY:  PHYSICIAN:  Excell SeltzerJohn J. Annabell HowellsWrenn, M.D.    DATE OF BIRTH:  April 26, 1956  DATE OF PROCEDURE:  08/03/2014 DATE OF DISCHARGE:  08/03/2014                              OPERATIVE REPORT   PROCEDURE:  Cystoscopy with hydrodistention of the bladder and instillation of Pyridium and Marcaine.  PREOPERATIVE DIAGNOSIS:  Interstitial cystitis.  POSTOPERATIVE DIAGNOSIS:  Interstitial cystitis.  SURGEON:  Excell SeltzerJohn J. Annabell HowellsWrenn, MD  ANESTHESIA:  General.  SPECIMENS:  None.  DRAINS:  A 16-French Foley catheter.  COMPLICATIONS:  None.  INDICATIONS:  Ms. [name] is a 58 year old white female with a long history of interstitial cystitis and requires periodic hydrodistention and instillation.  She presents today for that procedure.  FINDINGS OF PROCEDURE:  She was taken to the operating room where general anesthetic was induced.  She was placed in lithotomy position and fitted with PAS hose.  She was given Cipro.  Her perineum and genitalia were prepped with Betadine solution.  She was draped in usual sterile fashion.  Cystoscopy was performed using a 22-French scope and 12-degree lens. Examination revealed a normal urethra.  The bladder wall had mild trabeculation.  No tumors or stones were noted.  Ureteral orifices were unremarkable.  The bladder was then filled under 80 cm of water pressure to capacity and this was held for a minute and the bladder was drained.  Her capacity under anesthesia was 750 mL. Reinspection of the bladder following cystoscopy revealed glomerulations consistent with interstitial cystitis.  No cracking or ulcers were identified.  The bladder was drained and a 16-French Foley catheter was inserted. The bladder was instilled with 30 mL of 0.25% Marcaine with 400 mg crushed Pyridium.  The catheter was plugged.  The patient was taken  down from lithotomy position.  Her anesthetic was reversed.  She was moved to recovery room in stable condition.  There were no complications.     Excell SeltzerJohn J. Annabell HowellsWrenn, M.D.     JJW/MEDQ  D:  08/03/2014  T:  08/03/2014  Job:  [id number]

## 2014-08-03 NOTE — Discharge Instructions (Addendum)
Foley Catheter Care °A Foley catheter is a soft, flexible tube that is placed into the bladder to drain urine. A Foley catheter may be inserted if: °· You leak urine or are not able to control when you urinate (urinary incontinence). °· You are not able to urinate when you need to (urinary retention). °· You had prostate surgery or surgery on the genitals. °· You have certain medical conditions, such as multiple sclerosis, dementia, or a spinal cord injury. °If you are going home with a Foley catheter in place, follow the instructions below. °TAKING CARE OF THE CATHETER °1. Wash your hands with soap and water. °2. Using mild soap and warm water on a clean washcloth: °· Clean the area on your body closest to the catheter insertion site using a circular motion, moving away from the catheter. Never wipe toward the catheter because this could sweep bacteria up into the urethra and cause infection. °· Remove all traces of soap. Pat the area dry with a clean towel. For males, reposition the foreskin. °3. Attach the catheter to your leg so there is no tension on the catheter. Use adhesive tape or a leg strap. If you are using adhesive tape, remove any sticky residue left behind by the previous tape you used. °4. Keep the drainage bag below the level of the bladder, but keep it off the floor. °5. Check throughout the day to be sure the catheter is working and urine is draining freely. Make sure the tubing does not become kinked. °6. Do not pull on the catheter or try to remove it. Pulling could damage internal tissues. °TAKING CARE OF THE DRAINAGE BAGS °You will be given two drainage bags to take home. One is a large overnight drainage bag, and the other is a smaller leg bag that fits underneath clothing. You may wear the overnight bag at any time, but you should never wear the smaller leg bag at night. Follow the instructions below for how to empty, change, and clean your drainage bags. °Emptying the Drainage Bag °You must  empty your drainage bag when it is  -½ full or at least 2-3 times a day. °1. Wash your hands with soap and water. °2. Keep the drainage bag below your hips, below the level of your bladder. This stops urine from going back into the tubing and into your bladder. °3. Hold the dirty bag over the toilet or a clean container. °4. Open the pour spout at the bottom of the bag and empty the urine into the toilet or container. Do not let the pour spout touch the toilet, container, or any other surface. Doing so can place bacteria on the bag, which can cause an infection. °5. Clean the pour spout with a gauze pad or cotton ball that has rubbing alcohol on it. °6. Close the pour spout. °7. Attach the bag to your leg with adhesive tape or a leg strap. °8. Wash your hands well. °Changing the Drainage Bag °Change your drainage bag once a month or sooner if it starts to smell bad or look dirty. Below are steps to follow when changing the drainage bag. °1. Wash your hands with soap and water. °2. Pinch off the rubber catheter so that urine does not spill out. °3. Disconnect the catheter tube from the drainage tube at the connection valve. Do not let the tubes touch any surface. °4. Clean the end of the catheter tube with an alcohol wipe. Use a different alcohol wipe to clean the   end of the drainage tube. 5. Connect the catheter tube to the drainage tube of the clean drainage bag. 6. Attach the new bag to the leg with adhesive tape or a leg strap. Avoid attaching the new bag too tightly. 7. Wash your hands well. Cleaning the Drainage Bag 1. Wash your hands with soap and water. 2. Wash the bag in warm, soapy water. 3. Rinse the bag thoroughly with warm water. 4. Fill the bag with a solution of white vinegar and water (1 cup vinegar to 1 qt warm water [.2 L vinegar to 1 L warm water]). Close the bag and soak it for 30 minutes in the solution. 5. Rinse the bag with warm water. 6. Hang the bag to dry with the pour spout open  and hanging downward. 7. Store the clean bag (once it is dry) in a clean plastic bag. 8. Wash your hands well. PREVENTING INFECTION  Wash your hands before and after handling your catheter.  Take showers daily and wash the area where the catheter enters your body. Do not take baths. Replace wet leg straps with dry ones, if this applies.  Do not use powders, sprays, or lotions on the genital area. Only use creams, lotions, or ointments as directed by your caregiver.  For females, wipe from front to back after each bowel movement.  Drink enough fluids to keep your urine clear or pale yellow unless you have a fluid restriction.  Do not let the drainage bag or tubing touch or lie on the floor.  Wear cotton underwear to absorb moisture and to keep your skin drier. SEEK MEDICAL CARE IF:   Your urine is cloudy or smells unusually bad.  Your catheter becomes clogged.  You are not draining urine into the bag or your bladder feels full.  Your catheter starts to leak. SEEK IMMEDIATE MEDICAL CARE IF:   You have pain, swelling, redness, or pus where the catheter enters the body.  You have pain in the abdomen, legs, lower back, or bladder.  You have a fever.  You see blood fill the catheter, or your urine is pink or red.  You have nausea, vomiting, or chills.  Your catheter gets pulled out. MAKE SURE YOU:   Understand these instructions.  Will watch your condition.  Will get help right away if you are not doing well or get worse. Document Released: 09/01/2005 Document Revised: 01/16/2014 Document Reviewed: 08/23/2012 [facility] Patient Information 2015 [employer] [place]. This information is not intended to replace advice given to you by your health care provider. Make sure you discuss any questions you have with your health care provider.  You can remove the catheter in the morning.     Post Anesthesia Home Care Instructions  Activity: Get plenty of rest for the remainder of the  day. A responsible adult should stay with you for 24 hours following the procedure.  For the next 24 hours, DO NOT: -Drive a car -Advertising copywriterperate machinery -Drink alcoholic beverages -Take any medication unless instructed by your physician -Make any legal decisions or sign important papers.  Meals: Start with liquid foods such as gelatin or soup. Progress to regular foods as tolerated. Avoid greasy, spicy, heavy foods. If nausea and/or vomiting occur, drink only clear liquids until the nausea and/or vomiting subsides. Call your physician if vomiting continues.  Special Instructions/Symptoms: Your throat may feel dry or sore from the anesthesia or the breathing tube placed in your throat during surgery. If this causes discomfort, gargle with warm  salt water. The discomfort should disappear within 24 hours.      

## 2014-08-04 ENCOUNTER — Encounter (HOSPITAL_BASED_OUTPATIENT_CLINIC_OR_DEPARTMENT_OTHER): Payer: Self-pay | Admitting: Urology

## 2015-10-31 ENCOUNTER — Other Ambulatory Visit: Payer: Self-pay | Admitting: Urology

## 2015-11-01 MED ORDER — PHENAZOPYRIDINE HCL 200 MG PO TABS: Freq: Once | ORAL | Status: AC

## 2015-11-06 ENCOUNTER — Encounter (HOSPITAL_BASED_OUTPATIENT_CLINIC_OR_DEPARTMENT_OTHER): Payer: Self-pay | Admitting: *Deleted

## 2015-11-06 NOTE — Progress Notes (Signed)
NPO AFTER MN.  ARRIVE AT 1015.  NEEDS HG. 

## 2015-11-07 NOTE — H&P (Signed)
Active Problems Problems  1. Bladder pain (R39.89) 2. Chronic interstitial cystitis without hematuria (N30.10) 3. Urge incontinence of urine (N39.41)  History of Present Illness Kaitlyn [name] returns today with recurrent symptoms which have been present over the last 2 months. She has a history of IC but is just on pyridium and uribel. She has required occasional HOD's. She is primary having moderate pain with a full bladder and dysuria intermittently but her UA is clear. She feels bloated and has symptoms almost daily.  She has intermittent frequency 3-4x and nocturia x2. She has stable SUI and some UUI. She denies hematuria. She has had no recent UTI's. She feels like she needs another cysto HOD which she has required periodically. She has no associated signs or symptoms.   Past Medical History Problems  1. History of Anxiety (F41.9) 2. History of Arthritis 3. History of Chronic interstitial cystitis without hematuria (N30.10) 4. History of depression (Z86.59) 5. History of esophageal reflux (Z87.19) 6. History of hypercholesterolemia (Z86.39) 7. History of hypothyroidism (Z86.39) 8. History of Urinary retention (R33.9)  Surgical History Problems  1. History of Bladder Irrigation 2. History of Bladder Irrigation 3. History of Bladder Irrigation 4. History of Bladder Irrigation 5. History of Bladder Irrigation 6. History of Bladder Irrigation 7. History of Bladder Surgery 8. History of Cystoscopy With Dilation Of Bladder 9. History of Cystoscopy With Dilation Of Bladder 10. History of Cystoscopy With Dilation Of Bladder 11. History of Cystoscopy With Dilation Of Bladder 12. History of Cystoscopy With Dilation Of Bladder 13. History of Cystoscopy With Dilation Of Bladder 14. History of Cystoscopy With Dilation Of Bladder 15. History of Hysterectomy 16. History of Tonsillectomy  Current Meds 1. Depakote 250 MG Oral Tablet Delayed Release;  Therapy: (Recorded:13Feb2017) to  Recorded 2. Levothyroxine Sodium 88 MCG Oral Tablet;  Therapy: (Recorded:04May2015) to Recorded 3. Lexapro 20 MG Oral Tablet;  Therapy: 20Jun2012 to Recorded 4. Lexapro TABS;  Therapy: (Recorded:13Feb2017) to Recorded 5. Pyridium 200 MG Oral Tablet; TAKE 1 TABLET 3 TIMES DAILY AS NEEDED FOR PAIN;  Therapy: 02Mar2015 to (Evaluate:22Mar2015)  Requested for: 02Mar2015; Last  Rx:02Mar2015 Ordered 6. SEROquel TABS;  Therapy: (Recorded:13Feb2017) to Recorded 7. Uribel 118 MG Oral Capsule; TAKE 1 CAPSULE 4 times daily;  Therapy: 23Nov2016 to (Evaluate:23Mar2017)  Requested for: 27Nov2016; Last  Rx:23Nov2016 Ordered  Allergies Medication  1. Sulfa Drugs  Family History Problems  1. Family history of Breast Cancer   aunt 2. Family history of Family Health Status Number Of Children   1 daughter 3. Family history of Lung Cancer : Mother 4. Family history of Lung Cancer : Father 5. Family history of Nephrolithiasis : Brother  Social History Problems  1. Denied: Alcohol Use (History) 2. Caffeine Use   2 cps coffee a day 3. Current every day smoker (F17.200) 4. Family history of Death In The Family Father   age 69 lung cancer 5. Family history of Death In The Family Mother   age 34 lung cancer 6. Marital History - Currently Married 7. Occupation:   [employer] 8. Tobacco Use   1/2 pack, 10 years  Past and social history reviewed and updated.   Review of Systems  Genitourinary: nocturia and suprapubic pain.  Gastrointestinal: no diarrhea and no constipation.  Constitutional: no recent weight loss.  Cardiovascular: no chest pain.  Respiratory: no shortness of breath.    Vitals Vital Signs [Data Includes: Last 1 Day]  Recorded: 13Feb2017 02:51PM  Weight: 140 lb  BMI Calculated: 28.76 BSA Calculated: 1.57 Blood  Pressure: 148 / 97 Temperature: 98.1 F Heart Rate: 85  Physical Exam Constitutional: Well nourished and well developed . No acute distress.  Pulmonary: No  respiratory distress and normal respiratory rhythm and effort.  Cardiovascular: Heart rate and rhythm are normal . No peripheral edema.    Results/Data Urine [Data Includes: Last 1 Day]   13Feb2017  COLOR YELLOW   APPEARANCE CLEAR   SPECIFIC GRAVITY 1.015   pH 6.5   GLUCOSE NEGATIVE   BILIRUBIN NEGATIVE   KETONE NEGATIVE   BLOOD 1+   PROTEIN NEGATIVE   NITRITE NEGATIVE   LEUKOCYTE ESTERASE TRACE   SQUAMOUS EPITHELIAL/HPF 6-10 HPF  WBC 0-5 WBC/HPF  RBC 0-2 RBC/HPF  BACTERIA NONE SEEN HPF  CRYSTALS NONE SEEN HPF  CASTS NONE SEEN LPF  Yeast NONE SEEN HPF   The following clinical lab reports were reviewed:  UA reviewed.    Assessment Assessed  1. Bladder pain (R39.89) 2. Chronic interstitial cystitis without hematuria (N30.10) 3. Urge incontinence of urine (N39.41)  Kaitlyn [name] has recurrent symptoms and would like to have a cystoscopy with HOD.   Plan Bladder pain, Chronic interstitial cystitis without hematuria  1. Renew: Phenazopyridine HCl - 200 MG Oral Tablet; TAKE 1 TABLET 3 TIMES DAILY AS  NEEDED FOR PAIN Chronic interstitial cystitis without hematuria  2. Follow-up Schedule Surgery Office  Follow-up  Status: Hold For - Appointment   Requested for: 13Feb2017 Health Maintenance  3. UA With REFLEX; [Do Not Release]; Status:Resulted - Requires Verification;   Done:  13Feb2017 02:38PM  I will get her scheduled for the procedure. She is well aware of the risks.  I have refilled her meds.   Discussion/Summary CC: Dr. Burnell Blanks.

## 2015-11-08 ENCOUNTER — Encounter (HOSPITAL_BASED_OUTPATIENT_CLINIC_OR_DEPARTMENT_OTHER): Payer: Self-pay | Admitting: *Deleted

## 2015-11-08 ENCOUNTER — Ambulatory Visit (HOSPITAL_BASED_OUTPATIENT_CLINIC_OR_DEPARTMENT_OTHER): Payer: BLUE CROSS/BLUE SHIELD | Admitting: Anesthesiology

## 2015-11-08 ENCOUNTER — Ambulatory Visit (HOSPITAL_BASED_OUTPATIENT_CLINIC_OR_DEPARTMENT_OTHER)
Admission: RE | Admit: 2015-11-08 | Discharge: 2015-11-08 | Disposition: A | Discharge status: Home / Self Care | Payer: BLUE CROSS/BLUE SHIELD | Source: Ambulatory Visit | Attending: Urology | Admitting: Urology

## 2015-11-08 ENCOUNTER — Encounter (HOSPITAL_BASED_OUTPATIENT_CLINIC_OR_DEPARTMENT_OTHER)
Admission: RE | Disposition: A | Discharge status: Home / Self Care | Payer: Self-pay | Source: Ambulatory Visit | Attending: Urology

## 2015-11-08 DIAGNOSIS — F17200 Nicotine dependence, unspecified, uncomplicated: Secondary | ICD-10-CM | POA: Diagnosis not present

## 2015-11-08 DIAGNOSIS — N3941 Urge incontinence: Secondary | ICD-10-CM | POA: Diagnosis not present

## 2015-11-08 DIAGNOSIS — M1990 Unspecified osteoarthritis, unspecified site: Secondary | ICD-10-CM | POA: Insufficient documentation

## 2015-11-08 DIAGNOSIS — E039 Hypothyroidism, unspecified: Secondary | ICD-10-CM | POA: Diagnosis not present

## 2015-11-08 DIAGNOSIS — N3010 Interstitial cystitis (chronic) without hematuria: Secondary | ICD-10-CM | POA: Diagnosis not present

## 2015-11-08 DIAGNOSIS — F3130 Bipolar disorder, current episode depressed, mild or moderate severity, unspecified: Secondary | ICD-10-CM | POA: Insufficient documentation

## 2015-11-08 DIAGNOSIS — K219 Gastro-esophageal reflux disease without esophagitis: Secondary | ICD-10-CM | POA: Diagnosis not present

## 2015-11-08 DIAGNOSIS — E7800 Pure hypercholesterolemia, unspecified: Secondary | ICD-10-CM | POA: Diagnosis not present

## 2015-11-08 DIAGNOSIS — Z79899 Other long term (current) drug therapy: Secondary | ICD-10-CM | POA: Diagnosis not present

## 2015-11-08 DIAGNOSIS — F419 Anxiety disorder, unspecified: Secondary | ICD-10-CM | POA: Insufficient documentation

## 2015-11-08 HISTORY — PX: CYSTO WITH HYDRODISTENSION: SHX5453

## 2015-11-08 LAB — POCT HEMOGLOBIN-HEMACUE: Hemoglobin: 14 g/dL (ref 12.0–15.0)

## 2015-11-08 SURGERY — CYSTOSCOPY, WITH BLADDER HYDRODISTENSION
Anesthesia: General

## 2015-11-08 MED ORDER — ONDANSETRON HCL 4 MG/2ML IJ SOLN
INTRAMUSCULAR | Status: AC
  Filled 2015-11-08: qty 2

## 2015-11-08 MED ORDER — SODIUM CHLORIDE 0.9% FLUSH
3.0000 mL | Freq: Two times a day (BID) | INTRAVENOUS | Status: DC
  Filled 2015-11-08: qty 3

## 2015-11-08 MED ORDER — FENTANYL CITRATE (PF) 100 MCG/2ML IJ SOLN
INTRAMUSCULAR | Status: DC | PRN
  Administered 2015-11-08: 50 ug via INTRAVENOUS

## 2015-11-08 MED ORDER — FENTANYL CITRATE (PF) 100 MCG/2ML IJ SOLN
25.0000 ug | INTRAMUSCULAR | Status: DC | PRN
  Filled 2015-11-08: qty 1

## 2015-11-08 MED ORDER — KETOROLAC TROMETHAMINE 30 MG/ML IJ SOLN
INTRAMUSCULAR | Status: DC | PRN
  Administered 2015-11-08: 30 mg via INTRAVENOUS

## 2015-11-08 MED ORDER — DEXAMETHASONE SODIUM PHOSPHATE 10 MG/ML IJ SOLN
INTRAMUSCULAR | Status: AC
  Filled 2015-11-08: qty 1

## 2015-11-08 MED ORDER — PHENAZOPYRIDINE HCL 200 MG PO TABS
ORAL_TABLET | ORAL | Status: DC | PRN
  Administered 2015-11-08: 30 mL via INTRAVESICAL

## 2015-11-08 MED ORDER — SODIUM CHLORIDE 0.9 % IV SOLN
250.0000 mL | INTRAVENOUS | Status: DC | PRN
  Filled 2015-11-08: qty 250

## 2015-11-08 MED ORDER — ONDANSETRON HCL 4 MG/2ML IJ SOLN
INTRAMUSCULAR | Status: DC | PRN
Start: 2015-11-08 — End: 2015-11-08
  Administered 2015-11-08: 4 mg via INTRAVENOUS

## 2015-11-08 MED ORDER — LIDOCAINE HCL (CARDIAC) 20 MG/ML IV SOLN
INTRAVENOUS | Status: AC
  Filled 2015-11-08: qty 5

## 2015-11-08 MED ORDER — ACETAMINOPHEN 650 MG RE SUPP
650.0000 mg | RECTAL | Status: DC | PRN
  Filled 2015-11-08: qty 1

## 2015-11-08 MED ORDER — LACTATED RINGERS IV SOLN
INTRAVENOUS | Status: DC
  Administered 2015-11-08: 11:00:00 via INTRAVENOUS
  Filled 2015-11-08: qty 1000

## 2015-11-08 MED ORDER — MIDAZOLAM HCL 2 MG/2ML IJ SOLN
INTRAMUSCULAR | Status: AC
  Filled 2015-11-08: qty 2

## 2015-11-08 MED ORDER — PHENAZOPYRIDINE HCL 200 MG PO TABS: 200.0000 mg | ORAL_TABLET | Freq: Three times a day (TID) | ORAL | Status: DC | PRN

## 2015-11-08 MED ORDER — CEFAZOLIN SODIUM-DEXTROSE 2-3 GM-% IV SOLR
INTRAVENOUS | Status: AC
  Filled 2015-11-08: qty 50

## 2015-11-08 MED ORDER — LIDOCAINE HCL (CARDIAC) 20 MG/ML IV SOLN
INTRAVENOUS | Status: DC | PRN
  Administered 2015-11-08: 50 mg via INTRAVENOUS

## 2015-11-08 MED ORDER — ONDANSETRON HCL 4 MG/2ML IJ SOLN
4.0000 mg | Freq: Four times a day (QID) | INTRAMUSCULAR | Status: DC | PRN
  Filled 2015-11-08: qty 2

## 2015-11-08 MED ORDER — KETOROLAC TROMETHAMINE 30 MG/ML IJ SOLN
INTRAMUSCULAR | Status: AC
  Filled 2015-11-08: qty 1

## 2015-11-08 MED ORDER — PROPOFOL 10 MG/ML IV BOLUS
INTRAVENOUS | Status: AC
  Filled 2015-11-08: qty 20

## 2015-11-08 MED ORDER — SODIUM CHLORIDE 0.9% FLUSH
3.0000 mL | INTRAVENOUS | Status: DC | PRN
  Filled 2015-11-08: qty 3

## 2015-11-08 MED ORDER — ACETAMINOPHEN 325 MG PO TABS
650.0000 mg | ORAL_TABLET | ORAL | Status: DC | PRN
  Filled 2015-11-08: qty 2

## 2015-11-08 MED ORDER — STERILE WATER FOR IRRIGATION IR SOLN
Status: DC | PRN
Start: 2015-11-08 — End: 2015-11-08
  Administered 2015-11-08: 3000 mL via INTRAVESICAL

## 2015-11-08 MED ORDER — MIDAZOLAM HCL 5 MG/5ML IJ SOLN
INTRAMUSCULAR | Status: DC | PRN
  Administered 2015-11-08: 2 mg via INTRAVENOUS

## 2015-11-08 MED ORDER — FENTANYL CITRATE (PF) 100 MCG/2ML IJ SOLN
INTRAMUSCULAR | Status: AC
  Filled 2015-11-08: qty 2

## 2015-11-08 MED ORDER — DEXAMETHASONE SODIUM PHOSPHATE 4 MG/ML IJ SOLN
INTRAMUSCULAR | Status: DC | PRN
  Administered 2015-11-08: 10 mg via INTRAVENOUS

## 2015-11-08 MED ORDER — OXYCODONE HCL 5 MG PO TABS
5.0000 mg | ORAL_TABLET | ORAL | Status: DC | PRN
  Filled 2015-11-08: qty 2

## 2015-11-08 MED ORDER — OXYCODONE HCL 5 MG PO TABS
5.0000 mg | ORAL_TABLET | Freq: Once | ORAL | Status: AC | PRN
  Administered 2015-11-08: 5 mg via ORAL
  Filled 2015-11-08: qty 1

## 2015-11-08 MED ORDER — CEPHALEXIN 500 MG PO CAPS: 500.0000 mg | ORAL_CAPSULE | Freq: Three times a day (TID) | ORAL | Status: DC

## 2015-11-08 MED ORDER — HYDROCODONE-ACETAMINOPHEN 5-325 MG PO TABS: 1.0000 | ORAL_TABLET | Freq: Four times a day (QID) | ORAL | Status: DC | PRN

## 2015-11-08 MED ORDER — PROPOFOL 10 MG/ML IV BOLUS
INTRAVENOUS | Status: DC | PRN
  Administered 2015-11-08: 120 mg via INTRAVENOUS

## 2015-11-08 MED ORDER — OXYCODONE HCL 5 MG PO TABS
ORAL_TABLET | ORAL | Status: AC
  Filled 2015-11-08: qty 1

## 2015-11-08 MED ORDER — OXYCODONE HCL 5 MG/5ML PO SOLN
5.0000 mg | Freq: Once | ORAL | Status: AC | PRN
  Filled 2015-11-08: qty 5

## 2015-11-08 MED ORDER — CEFAZOLIN SODIUM-DEXTROSE 2-3 GM-% IV SOLR
2.0000 g | INTRAVENOUS | Status: AC
  Administered 2015-11-08: 2 g via INTRAVENOUS
  Filled 2015-11-08: qty 50

## 2015-11-08 SURGICAL SUPPLY — 19 items
BAG DRAIN URO-CYSTO SKYTR STRL (DRAIN) ×3 IMPLANT
BAG URINE LEG 19OZ MD ST LTX (BAG) ×3 IMPLANT
CANISTER SUCT LVC 12 LTR MEDI- (MISCELLANEOUS) IMPLANT
CATH FOLEY 2WAY SLVR  5CC 16FR (CATHETERS) ×2
CATH FOLEY 2WAY SLVR 5CC 16FR (CATHETERS) ×1 IMPLANT
CATH ROBINSON RED A/P 16FR (CATHETERS) ×3 IMPLANT
CLOTH BEACON ORANGE TIMEOUT ST (SAFETY) ×3 IMPLANT
GLOVE SURG SS PI 8.0 STRL IVOR (GLOVE) ×3 IMPLANT
GOWN STRL REUS W/ TWL XL LVL3 (GOWN DISPOSABLE) ×1 IMPLANT
GOWN STRL REUS W/TWL XL LVL3 (GOWN DISPOSABLE) ×2
KIT ROOM TURNOVER WOR (KITS) ×3 IMPLANT
MANIFOLD NEPTUNE II (INSTRUMENTS) IMPLANT
NDL SAFETY ECLIPSE 18X1.5 (NEEDLE) ×1 IMPLANT
NEEDLE HYPO 18GX1.5 SHARP (NEEDLE) ×2
PACK CYSTO (CUSTOM PROCEDURE TRAY) ×3 IMPLANT
SYR 30ML LL (SYRINGE) ×3 IMPLANT
TUBE CONNECTING 12'X1/4 (SUCTIONS) ×1
TUBE CONNECTING 12X1/4 (SUCTIONS) ×2 IMPLANT
WATER STERILE IRR 3000ML UROMA (IV SOLUTION) ×3 IMPLANT

## 2015-11-08 NOTE — Anesthesia Preprocedure Evaluation (Signed)
Anesthesia Evaluation  Patient identified by MRN, date of birth, ID band Patient awake    Reviewed: Allergy & Precautions, NPO status , Patient's Chart, lab work & pertinent test results  History of Anesthesia Complications (+) PONV  Airway Mallampati: II   Neck ROM: full    Dental   Pulmonary Current Smoker,    breath sounds clear to auscultation       Cardiovascular negative cardio ROS   Rhythm:regular Rate:Normal     Neuro/Psych PSYCHIATRIC DISORDERS Depression Bipolar Disorder    GI/Hepatic GERD  ,  Endo/Other  Hypothyroidism   Renal/GU      Musculoskeletal   Abdominal   Peds  Hematology   Anesthesia Other Findings   Reproductive/Obstetrics                             Anesthesia Physical Anesthesia Plan  ASA: II  Anesthesia Plan: General   Post-op Pain Management:    Induction: Intravenous  Airway Management Planned: LMA  Additional Equipment:   Intra-op Plan:   Post-operative Plan:   Informed Consent: I have reviewed the patients History and Physical, chart, labs and discussed the procedure including the risks, benefits and alternatives for the proposed anesthesia with the patient or authorized representative who has indicated his/her understanding and acceptance.     Plan Discussed with: CRNA, Anesthesiologist and Surgeon  Anesthesia Plan Comments:         Anesthesia Quick Evaluation

## 2015-11-08 NOTE — Discharge Instructions (Addendum)
CYSTOSCOPY HOME CARE INSTRUCTIONS  Activity: Rest for the remainder of the day.  Do not drive or operate equipment today.  You may resume normal activities in one to two days as instructed by your physician.   Meals: Drink plenty of liquids and eat light foods such as gelatin or soup this evening.  You may return to a normal meal plan tomorrow.  Return to Work: You may return to work in one to two days or as instructed by your physician.  Special Instructions / Symptoms: Call your physician if any of these symptoms occur:   -persistent or heavy bleeding  -bleeding which continues after first few urination  -large blood clots that are difficult to pass  -urine stream diminishes or stops completely  -fever equal to or higher than 101 degrees Farenheit.  -cloudy urine with a strong, foul odor  -severe pain  Females should always wipe from front to back after elimination.  You may feel some burning pain when you urinate.  This should disappear with time.  Applying moist heat to the lower abdomen or a hot tub bath may help relieve the pain. \  Remove the foley in the morning      Post Anesthesia Home Care Instructions  Activity: Get plenty of rest for the remainder of the day. A responsible adult should stay with you for 24 hours following the procedure.  For the next 24 hours, DO NOT: -Drive a car -Advertising copywriter -Drink alcoholic beverages -Take any medication unless instructed by your physician -Make any legal decisions or sign important papers.  Meals: Start with liquid foods such as gelatin or soup. Progress to regular foods as tolerated. Avoid greasy, spicy, heavy foods. If nausea and/or vomiting occur, drink only clear liquids until the nausea and/or vomiting subsides. Call your physician if vomiting continues.  Special Instructions/Symptoms: Your throat may feel dry or sore from the anesthesia or the breathing tube placed in your throat during surgery. If this causes  discomfort, gargle with warm salt water. The discomfort should disappear within 24 hours.  If you had a scopolamine patch placed behind your ear for the management of post- operative nausea and/or vomiting:  1. The medication in the patch is effective for 72 hours, after which it should be removed.  Wrap patch in a tissue and discard in the trash. Wash hands thoroughly with soap and water. 2. You may remove the patch earlier than 72 hours if you experience unpleasant side effects which may include dry mouth, dizziness or visual disturbances. 3. Avoid touching the patch. Wash your hands with soap and water after contact with the patch.

## 2015-11-08 NOTE — Interval H&P Note (Signed)
History and Physical Interval Note:  11/08/2015 11:46 AM  Kaitlyn [name]  has presented today for surgery, with the diagnosis of INTERSTITIAL CYSTITIS  The various methods of treatment have been discussed with the patient and family. After consideration of risks, benefits and other options for treatment, the patient has consented to  Procedure(s): CYSTOSCOPY/HYDRODISTENSION (N/A) as a surgical intervention .  The patient's history has been reviewed, patient examined, no change in status, stable for surgery.  I have reviewed the patient's chart and labs.  Questions were answered to the patient's satisfaction.     Tivon Lemoine J

## 2015-11-08 NOTE — Transfer of Care (Signed)
Immediate Anesthesia Transfer of Care Note  Patient: Kaitlyn [name]  Procedure(s) Performed: Procedure(s) (LRB): CYSTOSCOPY/HYDRODISTENSION (N/A)  Patient Location: PACU  Anesthesia Type: General  Level of Consciousness: awake, oriented, sedated and patient cooperative  Airway & Oxygen Therapy: Patient Spontanous Breathing and Patient connected to face mask oxygen  Post-op Assessment: Report given to PACU RN and Post -op Vital signs reviewed and stable  Post vital signs: Reviewed and stable  Complications: No apparent anesthesia complications

## 2015-11-08 NOTE — Anesthesia Procedure Notes (Signed)
Procedure Name: LMA Insertion Date/Time: 11/08/2015 11:54 AM Performed by: Renella Cunas D Pre-anesthesia Checklist: Patient identified, Emergency Drugs available, Suction available and Patient being monitored Patient Re-evaluated:Patient Re-evaluated prior to inductionOxygen Delivery Method: Circle System Utilized Preoxygenation: Pre-oxygenation with 100% oxygen Intubation Type: IV induction Ventilation: Mask ventilation without difficulty LMA: LMA inserted LMA Size: 4.0 Number of attempts: 1 Airway Equipment and Method: Bite block Placement Confirmation: positive ETCO2 Tube secured with: Tape Dental Injury: Teeth and Oropharynx as per pre-operative assessment

## 2015-11-08 NOTE — Brief Op Note (Signed)
11/08/2015  12:10 PM  PATIENT:  Kaitlyn [name]  60 y.o. female  PRE-OPERATIVE DIAGNOSIS:  INTERSTITIAL CYSTITIS  POST-OPERATIVE DIAGNOSIS:  interstitial cycstitis  PROCEDURE:  Procedure(s): CYSTOSCOPY/HYDRODISTENSION (N/A) Instillation of pyridium and marcaine SURGEON:  Surgeon(s) and Role:    * Bjorn Pippin, MD - Primary  PHYSICIAN ASSISTANT:   ASSISTANTS: none   ANESTHESIA:   general  EBL:  Total I/O In: 200 [I.V.:200] Out: -   BLOOD ADMINISTERED:none  DRAINS: Urinary Catheter (Foley)   LOCAL MEDICATIONS USED:  30ml of 0.5% marcaine intravesically  SPECIMEN:  No Specimen  DISPOSITION OF SPECIMEN:  PATHOLOGY  COUNTS:  YES  TOURNIQUET:  * No tourniquets in log *  DICTATION: .Other Dictation: Dictation Number [id number]  PLAN OF CARE: Discharge to home after PACU  PATIENT DISPOSITION:  PACU - hemodynamically stable.   Delay start of Pharmacological VTE agent (>24hrs) due to surgical blood loss or risk of bleeding: not applicable

## 2015-11-09 ENCOUNTER — Encounter (HOSPITAL_BASED_OUTPATIENT_CLINIC_OR_DEPARTMENT_OTHER): Payer: Self-pay | Admitting: Urology

## 2015-11-09 NOTE — Op Note (Signed)
NAMEFARRAN, Kaitlyn [name]                  ACCOUNT NO.:  [id number]  MEDICAL RECORD NO.:  [id number]  LOCATION:                                 FACILITY:  PHYSICIAN:  Excell Seltzer. Annabell Howells, M.D.    DATE OF BIRTH:  04-22-1956  DATE OF PROCEDURE:  11/08/2015 DATE OF DISCHARGE:                              OPERATIVE REPORT   PROCEDURE:  Cystoscopy with hydrodistention of bladder and instillation of Pyridium and Marcaine.  PREOPERATIVE DIAGNOSIS:  Interstitial cystitis.  POSTOPERATIVE DIAGNOSIS:  Interstitial cystitis.  SURGEON:  Excell Seltzer. Annabell Howells, M.D.  ANESTHESIA:  General.  SPECIMENS:  None.  DRAINS:  A 16-French Foley catheter.  BLOOD LOSS:  None.  COMPLICATIONS:  None.  INDICATIONS:  Kaitlyn [name] is a 60 year old white female with a long history of interstitial cystitis, who requires periodic hydrodistention.  She returns today for that procedure.  FINDINGS OF PROCEDURE:  She was given Ancef.  She was taken to the operating room where general anesthetic was induced.  She was placed in lithotomy position.  Her perineum and genitalia were prepped with Betadine solution.  She was draped in the usual sterile fashion.  Cystoscopy was performed using a 23-French scope and 30-degree lens. Examination revealed a normal urethra.  The bladder wall had mild trabeculation.  No tumors, stones, or inflammation were noted.  The ureteral orifices were in the normal anatomic position effluxing clear urine.  The bladder was distended to capacity under 80 cm of water pressure and drained.  Her capacity under anesthesia was 450 mL.  Reinspection after drainage revealed glomerular hemorrhages primarily at the bladder neck bilaterally and in the trigone.  No cracking or Hunner ulcers were identified.  After hydrodistention, the bladder was drained.  A 16-French Foley catheter was inserted.  The balloon was filled with 10 mL of sterile fluid.  The catheter was instilled with 30 mL of 0.5% Marcaine with  400 mg crushed Pyridium.  The catheter was then plugged for 5 minutes and then placed to straight drainage.  The patient was taken down from lithotomy position.  Her anesthetic was reversed.  She was moved to the recovery room in stable condition.  There were no complications.     Excell Seltzer. Annabell Howells, M.D.     JJW/MEDQ  D:  11/08/2015  T:  11/08/2015  Job:  [id number]

## 2015-11-09 NOTE — Anesthesia Postprocedure Evaluation (Signed)
Anesthesia Post Note  Patient: Kaitlyn [name]  Procedure(s) Performed: Procedure(s) (LRB): CYSTOSCOPY/HYDRODISTENSION (N/A)  Patient location during evaluation: PACU Anesthesia Type: General Level of consciousness: awake and alert and patient cooperative Pain management: pain level controlled Vital Signs Assessment: post-procedure vital signs reviewed and stable Respiratory status: spontaneous breathing and respiratory function stable Cardiovascular status: stable Anesthetic complications: no    Last Vitals:  Filed Vitals:   11/08/15 1300 11/08/15 1330  BP: 140/86 133/80  Pulse: 92 87  Temp:  36.7 C  Resp: 19 14    Last Pain:  Filed Vitals:   11/08/15 1342  PainSc: 3                  Brae Gartman S

## 2016-09-15 DIAGNOSIS — Z9289 Personal history of other medical treatment: Secondary | ICD-10-CM

## 2016-09-15 HISTORY — DX: Personal history of other medical treatment: Z92.89

## 2016-12-05 ENCOUNTER — Other Ambulatory Visit: Payer: Self-pay | Admitting: Urology

## 2016-12-08 ENCOUNTER — Encounter (HOSPITAL_BASED_OUTPATIENT_CLINIC_OR_DEPARTMENT_OTHER): Payer: Self-pay | Admitting: *Deleted

## 2016-12-08 NOTE — Progress Notes (Signed)
NPO AFTER MN.  ARRIVE AT 1000.  NEEDS HG. WILL TAKE PRILOSEC AM DOS W/ SIPS OF WATER. 

## 2016-12-10 NOTE — H&P (Signed)
CC/HPI: I have interstitial cystitis.     Kaitlyn [name] returns today with recurrent symptoms which have been present over the past week. She has a history of IC but is just on pyridium and uribel. She has required occasional HOD's with the last in 3/17. She is primary having moderate pain with a full bladder and dysuria intermittently but her UA is clear. She feels bloated and has symptoms almost daily. She has intermittent frequency 3-4x but no nocturia. She has stable SUI and some UUI. She has some post void dribbling. She denies hematuria. She has had no recent UTI's. She feels like she needs another cysto HOD which she has required periodically. She has no associated signs or symptoms.      ALLERGIES: Sulfa Drugs    MEDICATIONS: Levothyroxine Sodium 100 mcg tablet  Depakote  Lexapro 20 mg tablet  Seroquel 200 mg tablet     GU PSH: Cystoscopy Hydrodistention - 11/12/2015, 08/07/2014, 2015, 2014, 2013, 2011, 2010, 2009 Hysterectomy Unilat SO - 2009      PSH Notes: Bladder Irrigation, Cystoscopy With Dilation Of Bladder, Bladder Irrigation, Cystoscopy With Dilation Of Bladder, Bladder Irrigation, Cystoscopy With Dilation Of Bladder, Bladder Irrigation, Cystoscopy With Dilation Of Bladder, Cystoscopy With Dilation Of Bladder, Bladder Irrigation, Bladder Irrigation, Cystoscopy With Dilation Of Bladder, Cystoscopy With Dilation Of Bladder, Bladder Irrigation, Cystoscopy With Dilation Of Bladder, Hysterectomy, Bladder Surgery, Tonsillectomy   NON-GU PSH: Remove Tonsils - 2009    GU PMH: Other microscopic hematuria, Microscopic hematuria - 11/22/2015 Interstitial Cystitis, chronic w/o hematuria, Chronic interstitial cystitis without hematuria - 11/21/2015, Chronic Interstitial Cystitis, - 2014 Oth GU systems Signs/Symptoms, Bladder pain - 10/29/2015 Urge incontinence, Urge incontinence of urine - 10/29/2015 Urinary Retention, Unspec, Acute Urinary Retention - 2014      PMH Notes:  2006-11-06  15:32:12 - Note: Arthritis   NON-GU PMH: Encounter for general adult medical examination without abnormal findings, Encounter for preventive health examination - 11/22/2015 Anxiety, Anxiety - 2014 Personal history of other diseases of the digestive system, History of esophageal reflux - 2014 Personal history of other endocrine, nutritional and metabolic disease, History of hypercholesterolemia - 2014, History of hypothyroidism, - 2014 Personal history of other mental and behavioral disorders, History of depression - 2014    FAMILY HISTORY: Breast Cancer - Runs In Family Family Health Status Number - Runs In Family Lung Cancer - Father, Mother nephrolithiasis - Brother   SOCIAL HISTORY: Marital Status: Married Current Smoking Status: Patient smokes. Smokes 1/2 pack per day.   Tobacco Use Assessment Completed: Used Tobacco in last 30 days?     Notes: Current every day smoker, Caffeine Use, Death In The Family Father, Occupation:, Marital History - Currently Married, Alcohol Use, Tobacco Use, Death In The Family Mother   REVIEW OF SYSTEMS:    GU Review Female:   Patient reports burning /pain with urination and leakage of urine. Patient denies frequent urination, hard to postpone urination, get up at night to urinate, stream starts and stops, trouble starting your stream, have to strain to urinate, and currently pregnant.  Gastrointestinal (Upper):   Patient denies nausea, vomiting, and indigestion/ heartburn.  Gastrointestinal (Lower):   Patient denies diarrhea and constipation.  Constitutional:   Patient denies fever, night sweats, weight loss, and fatigue.  Skin:   Patient denies skin rash/ lesion and itching.  Eyes:   Patient denies blurred vision and double vision.  Ears/ Nose/ Throat:   Patient denies sore throat and sinus problems.  Hematologic/Lymphatic:   Patient denies  swollen glands and easy bruising.  Cardiovascular:   Patient denies leg swelling and chest pains.  Respiratory:    Patient denies cough and shortness of breath.  Endocrine:   Patient denies excessive thirst.  Musculoskeletal:   Patient denies back pain and joint pain.  Neurological:   Patient denies headaches and dizziness.  Psychologic:   Patient denies depression and anxiety.   VITAL SIGNS:      12/04/2016 02:20 PM  Weight 144 lb / 65.32 kg  Height 58.5 in / 148.59 cm  BP 128/90 mmHg  Pulse 82 /min  Temperature 97.1 F / 36 C  BMI 29.6 kg/m  Notes: Her PCP manages her BP.    MULTI-SYSTEM PHYSICAL EXAMINATION:    Constitutional: Well-nourished. No physical deformities. Normally developed. Good grooming.  Respiratory: No labored breathing, no use of accessory muscles. CTA  Cardiovascular: Normal temperature, RRR without murmur.     PAST DATA REVIEWED:  Source Of History:  Patient  Urine Test Review:   Urinalysis   PROCEDURES:          Urinalysis w/Scope Dipstick Dipstick Cont'd Micro  Color: Yellow Bilirubin: Neg WBC/hpf: 6 - 10/hpf  Appearance: Clear Ketones: Trace RBC/hpf: 3 - 10/hpf  Specific Gravity: 1.025 Blood: Trace Bacteria: Few (10-25/hpf)  pH: 7.5 Protein: Trace Cystals: NS (Not Seen)  Glucose: Neg Urobilinogen: 1.0 Casts: Hyaline    Nitrites: Neg Trichomonas: Not Present    Leukocyte Esterase: Trace Mucous: Present      Epithelial Cells: 0 - 5/hpf      Yeast: NS (Not Seen)      Sperm: Not Present    ASSESSMENT:      ICD-10 Details  1 GU:   Interstitial Cystitis, chronic w/o hematuria - N30.10 Worsening - She has increased pain from her IC and feels she needs another dilation. She has actually gone a fairly long time for her. I will get her set up for the procedure but sent a culture to make sure she doesn't have infection. I reviewed the risks of the procedure in detail.   2   Asymptomatic microscopic hematuria - R31.21 Stable, Chronic - minimal stable and chronic.  3   Mixed incontinence - N39.46 Stable - She continues to have mild incontinence.    PLAN:            Orders Labs Urine Culture          Schedule Return Visit/Planned Activity: Next Available Appointment - Schedule Surgery             Note: Needs cystoscopy with HOD.

## 2016-12-11 ENCOUNTER — Ambulatory Visit (HOSPITAL_BASED_OUTPATIENT_CLINIC_OR_DEPARTMENT_OTHER): Payer: BLUE CROSS/BLUE SHIELD | Admitting: Anesthesiology

## 2016-12-11 ENCOUNTER — Encounter (HOSPITAL_BASED_OUTPATIENT_CLINIC_OR_DEPARTMENT_OTHER): Payer: Self-pay | Admitting: Anesthesiology

## 2016-12-11 ENCOUNTER — Ambulatory Visit (HOSPITAL_BASED_OUTPATIENT_CLINIC_OR_DEPARTMENT_OTHER)
Admission: RE | Admit: 2016-12-11 | Discharge: 2016-12-11 | Disposition: A | Discharge status: Home / Self Care | Payer: BLUE CROSS/BLUE SHIELD | Source: Ambulatory Visit | Attending: Urology | Admitting: Urology

## 2016-12-11 ENCOUNTER — Encounter (HOSPITAL_BASED_OUTPATIENT_CLINIC_OR_DEPARTMENT_OTHER)
Admission: RE | Disposition: A | Discharge status: Home / Self Care | Payer: Self-pay | Source: Ambulatory Visit | Attending: Urology

## 2016-12-11 DIAGNOSIS — N3946 Mixed incontinence: Secondary | ICD-10-CM | POA: Insufficient documentation

## 2016-12-11 DIAGNOSIS — N3010 Interstitial cystitis (chronic) without hematuria: Secondary | ICD-10-CM | POA: Diagnosis present

## 2016-12-11 DIAGNOSIS — Z79899 Other long term (current) drug therapy: Secondary | ICD-10-CM | POA: Insufficient documentation

## 2016-12-11 DIAGNOSIS — K219 Gastro-esophageal reflux disease without esophagitis: Secondary | ICD-10-CM | POA: Diagnosis not present

## 2016-12-11 DIAGNOSIS — E039 Hypothyroidism, unspecified: Secondary | ICD-10-CM | POA: Diagnosis not present

## 2016-12-11 DIAGNOSIS — F17210 Nicotine dependence, cigarettes, uncomplicated: Secondary | ICD-10-CM | POA: Insufficient documentation

## 2016-12-11 DIAGNOSIS — N3011 Interstitial cystitis (chronic) with hematuria: Secondary | ICD-10-CM | POA: Diagnosis not present

## 2016-12-11 HISTORY — DX: Presence of spectacles and contact lenses: Z97.3

## 2016-12-11 HISTORY — DX: Presence of dental prosthetic device (complete) (partial): Z97.2

## 2016-12-11 HISTORY — PX: CYSTO WITH HYDRODISTENSION: SHX5453

## 2016-12-11 LAB — POCT HEMOGLOBIN-HEMACUE: HEMOGLOBIN: 13.7 g/dL (ref 12.0–15.0)

## 2016-12-11 SURGERY — CYSTOSCOPY, WITH BLADDER HYDRODISTENSION
Anesthesia: General | Site: Bladder

## 2016-12-11 MED ORDER — LACTATED RINGERS IV SOLN
INTRAVENOUS | Status: DC
  Administered 2016-12-11: 11:00:00 via INTRAVENOUS
  Filled 2016-12-11: qty 1000

## 2016-12-11 MED ORDER — ACETAMINOPHEN 650 MG RE SUPP
650.0000 mg | RECTAL | Status: DC | PRN
  Filled 2016-12-11: qty 1

## 2016-12-11 MED ORDER — PHENAZOPYRIDINE HCL 200 MG PO TABS: 200.0000 mg | ORAL_TABLET | Freq: Three times a day (TID) | ORAL | 11 refills | Status: DC | PRN

## 2016-12-11 MED ORDER — MIDAZOLAM HCL 2 MG/2ML IJ SOLN
INTRAMUSCULAR | Status: AC
  Filled 2016-12-11: qty 2

## 2016-12-11 MED ORDER — FENTANYL CITRATE (PF) 100 MCG/2ML IJ SOLN
25.0000 ug | INTRAMUSCULAR | Status: DC | PRN
  Filled 2016-12-11: qty 1

## 2016-12-11 MED ORDER — DEXAMETHASONE SODIUM PHOSPHATE 10 MG/ML IJ SOLN
INTRAMUSCULAR | Status: DC | PRN
  Administered 2016-12-11: 10 mg via INTRAVENOUS

## 2016-12-11 MED ORDER — ACETAMINOPHEN 325 MG PO TABS
650.0000 mg | ORAL_TABLET | ORAL | Status: DC | PRN
  Filled 2016-12-11: qty 2

## 2016-12-11 MED ORDER — CEFAZOLIN SODIUM-DEXTROSE 2-4 GM/100ML-% IV SOLN
2.0000 g | INTRAVENOUS | Status: AC
  Administered 2016-12-11: 2 g via INTRAVENOUS
  Filled 2016-12-11: qty 100

## 2016-12-11 MED ORDER — FENTANYL CITRATE (PF) 100 MCG/2ML IJ SOLN
INTRAMUSCULAR | Status: AC
  Filled 2016-12-11: qty 2

## 2016-12-11 MED ORDER — CEFAZOLIN SODIUM-DEXTROSE 2-4 GM/100ML-% IV SOLN
INTRAVENOUS | Status: AC
  Filled 2016-12-11: qty 100

## 2016-12-11 MED ORDER — HYDROCODONE-ACETAMINOPHEN 5-325 MG PO TABS: 1.0000 | ORAL_TABLET | Freq: Four times a day (QID) | ORAL | 0 refills | Status: DC | PRN

## 2016-12-11 MED ORDER — HYDROCODONE-ACETAMINOPHEN 5-325 MG PO TABS
1.0000 | ORAL_TABLET | Freq: Four times a day (QID) | ORAL | Status: DC | PRN
  Administered 2016-12-11: 1 via ORAL
  Filled 2016-12-11: qty 1

## 2016-12-11 MED ORDER — ONDANSETRON HCL 4 MG/2ML IJ SOLN
INTRAMUSCULAR | Status: DC | PRN
  Administered 2016-12-11: 4 mg via INTRAVENOUS

## 2016-12-11 MED ORDER — KETOROLAC TROMETHAMINE 30 MG/ML IJ SOLN
INTRAMUSCULAR | Status: DC | PRN
  Administered 2016-12-11: 30 mg via INTRAVENOUS

## 2016-12-11 MED ORDER — SODIUM CHLORIDE 0.9% FLUSH
3.0000 mL | Freq: Two times a day (BID) | INTRAVENOUS | Status: DC
  Filled 2016-12-11: qty 3

## 2016-12-11 MED ORDER — KETOROLAC TROMETHAMINE 30 MG/ML IJ SOLN
INTRAMUSCULAR | Status: AC
  Filled 2016-12-11: qty 1

## 2016-12-11 MED ORDER — FENTANYL CITRATE (PF) 100 MCG/2ML IJ SOLN
INTRAMUSCULAR | Status: DC | PRN
  Administered 2016-12-11: 50 ug via INTRAVENOUS

## 2016-12-11 MED ORDER — BUPIVACAINE HCL (PF) 0.5 % IJ SOLN
INTRAMUSCULAR | Status: DC | PRN
  Administered 2016-12-11: 15 mL via INTRAVESICAL

## 2016-12-11 MED ORDER — SODIUM CHLORIDE 0.9 % IV SOLN
250.0000 mL | INTRAVENOUS | Status: DC | PRN
  Filled 2016-12-11: qty 250

## 2016-12-11 MED ORDER — LIDOCAINE 2% (20 MG/ML) 5 ML SYRINGE
INTRAMUSCULAR | Status: DC | PRN
  Administered 2016-12-11: 100 mg via INTRAVENOUS

## 2016-12-11 MED ORDER — DEXAMETHASONE SODIUM PHOSPHATE 10 MG/ML IJ SOLN
INTRAMUSCULAR | Status: AC
  Filled 2016-12-11: qty 1

## 2016-12-11 MED ORDER — LIDOCAINE 2% (20 MG/ML) 5 ML SYRINGE
INTRAMUSCULAR | Status: AC
  Filled 2016-12-11: qty 5

## 2016-12-11 MED ORDER — HYDROCODONE-ACETAMINOPHEN 5-325 MG PO TABS
ORAL_TABLET | ORAL | Status: AC
  Filled 2016-12-11: qty 1

## 2016-12-11 MED ORDER — SODIUM CHLORIDE 0.9% FLUSH
3.0000 mL | INTRAVENOUS | Status: DC | PRN
  Filled 2016-12-11: qty 3

## 2016-12-11 MED ORDER — ONDANSETRON HCL 4 MG/2ML IJ SOLN
INTRAMUSCULAR | Status: AC
  Filled 2016-12-11: qty 2

## 2016-12-11 MED ORDER — PROPOFOL 10 MG/ML IV BOLUS
INTRAVENOUS | Status: AC
  Filled 2016-12-11: qty 20

## 2016-12-11 MED ORDER — ONDANSETRON HCL 4 MG/2ML IJ SOLN
4.0000 mg | Freq: Once | INTRAMUSCULAR | Status: DC | PRN
  Filled 2016-12-11: qty 2

## 2016-12-11 MED ORDER — PROPOFOL 10 MG/ML IV BOLUS
INTRAVENOUS | Status: DC | PRN
  Administered 2016-12-11: 150 mg via INTRAVENOUS

## 2016-12-11 MED ORDER — MIDAZOLAM HCL 5 MG/5ML IJ SOLN
INTRAMUSCULAR | Status: DC | PRN
  Administered 2016-12-11: 2 mg via INTRAVENOUS

## 2016-12-11 MED ORDER — OXYCODONE HCL 5 MG PO TABS
5.0000 mg | ORAL_TABLET | ORAL | Status: DC | PRN
  Filled 2016-12-11: qty 2

## 2016-12-11 SURGICAL SUPPLY — 21 items
BAG DRAIN URO-CYSTO SKYTR STRL (DRAIN) ×3 IMPLANT
CATH FOLEY 2WAY SLVR  5CC 16FR (CATHETERS) ×2
CATH FOLEY 2WAY SLVR 5CC 16FR (CATHETERS) ×1 IMPLANT
CATH ROBINSON RED A/P 16FR (CATHETERS) ×3 IMPLANT
CLOTH BEACON ORANGE TIMEOUT ST (SAFETY) ×3 IMPLANT
ELECT REM PT RETURN 9FT ADLT (ELECTROSURGICAL)
ELECTRODE REM PT RTRN 9FT ADLT (ELECTROSURGICAL) IMPLANT
GLOVE SURG SS PI 8.0 STRL IVOR (GLOVE) ×3 IMPLANT
GOWN STRL REUS W/ TWL XL LVL3 (GOWN DISPOSABLE) ×1 IMPLANT
GOWN STRL REUS W/TWL XL LVL3 (GOWN DISPOSABLE) ×2
KIT RM TURNOVER CYSTO AR (KITS) ×3 IMPLANT
MANIFOLD NEPTUNE II (INSTRUMENTS) IMPLANT
NDL SAFETY ECLIPSE 18X1.5 (NEEDLE) ×1 IMPLANT
NEEDLE HYPO 18GX1.5 SHARP (NEEDLE) ×2
NS IRRIG 500ML POUR BTL (IV SOLUTION) IMPLANT
PACK CYSTO (CUSTOM PROCEDURE TRAY) ×3 IMPLANT
PLUG CATH AND CAP STER (CATHETERS) ×3 IMPLANT
SYR 30ML LL (SYRINGE) ×3 IMPLANT
TUBE CONNECTING 12'X1/4 (SUCTIONS)
TUBE CONNECTING 12X1/4 (SUCTIONS) IMPLANT
WATER STERILE IRR 3000ML UROMA (IV SOLUTION) ×3 IMPLANT

## 2016-12-11 NOTE — Discharge Instructions (Addendum)
°  Post Anesthesia Home Care Instructions  Activity: Get plenty of rest for the remainder of the day. A responsible individual must stay with you for 24 hours following the procedure.  For the next 24 hours, DO NOT: -Drive a car -Advertising copywriterperate machinery -Drink alcoholic beverages -Take any medication unless instructed by your physician -Make any legal decisions or sign important papers.  Meals: Start with liquid foods such as gelatin or soup. Progress to regular foods as tolerated. Avoid greasy, spicy, heavy foods. If nausea and/or vomiting occur, drink only clear liquids until the nausea and/or vomiting subsides. Call your physician if vomiting continues.  Special Instructions/Symptoms: Your throat may feel dry or sore from the anesthesia or the breathing tube placed in your throat during surgery. If this causes discomfort, gargle with warm salt water. The discomfort should disappear within 24 hours.  If you had a scopolamine patch placed behind your ear for the management of post- operative nausea and/or vomiting:  1. The medication in the patch is effective for 72 hours, after which it should be removed.  Wrap patch in a tissue and discard in the trash. Wash hands thoroughly with soap and water. 2. You may remove the patch earlier than 72 hours if you experience unpleasant side effects which may include dry mouth, dizziness or visual disturbances. 3. Avoid touching the patch. Wash your hands with soap and water after contact with the patch.    CYSTOSCOPY HOME CARE INSTRUCTIONS  Activity: Rest for the remainder of the day.  Do not drive or operate equipment today.  You may resume normal activities in one to two days as instructed by your physician.   Meals: Drink plenty of liquids and eat light foods such as gelatin or soup this evening.  You may return to a normal meal plan tomorrow.  Return to Work: You may return to work in one to two days or as instructed by your physician.  Special  Instructions / Symptoms: Call your physician if any of these symptoms occur:   -persistent or heavy bleeding  -bleeding which continues after first few urination  -large blood clots that are difficult to pass  -urine stream diminishes or stops completely  -fever equal to or higher than 101 degrees Farenheit.  -cloudy urine with a strong, foul odor  -severe pain  Females should always wipe from front to back after elimination.  You may feel some burning pain when you urinate.  This should disappear with time.  Applying moist heat to the lower abdomen or a hot tub bath may help relieve the pain. \  You may remove the catheter in the morning.   Patient Signature:  ________________________________________________________  Nurse's Signature:  ________________________________________________________

## 2016-12-11 NOTE — Interval H&P Note (Signed)
History and Physical Interval Note:  12/11/2016 11:46 AM  Kaitlyn [name]  has presented today for surgery, with the diagnosis of INTERSTITIAL CYSTITIS  The various methods of treatment have been discussed with the patient and family. After consideration of risks, benefits and other options for treatment, the patient has consented to  Procedure(s): CYSTOSCOPY/HYDRODISTENSION (N/A) as a surgical intervention .  The patient's history has been reviewed, patient examined, no change in status, stable for surgery.  I have reviewed the patient's chart and labs.  Questions were answered to the patient's satisfaction.     Kensly Bowmer J

## 2016-12-11 NOTE — Anesthesia Preprocedure Evaluation (Signed)
Anesthesia Evaluation  Patient identified by MRN, date of birth, ID band Patient awake    Reviewed: Allergy & Precautions, NPO status , Patient's Chart, lab work & pertinent test results  Airway Mallampati: II  TM Distance: >3 FB Neck ROM: Full    Dental  (+) Edentulous Upper, Dental Advisory Given   Pulmonary Current Smoker,    breath sounds clear to auscultation       Cardiovascular  Rhythm:Regular Rate:Normal     Neuro/Psych    GI/Hepatic   Endo/Other    Renal/GU      Musculoskeletal   Abdominal   Peds  Hematology   Anesthesia Other Findings   Reproductive/Obstetrics                             Anesthesia Physical Anesthesia Plan  ASA: II  Anesthesia Plan: General   Post-op Pain Management:    Induction: Intravenous  Airway Management Planned: LMA  Additional Equipment:   Intra-op Plan:   Post-operative Plan:   Informed Consent: I have reviewed the patients History and Physical, chart, labs and discussed the procedure including the risks, benefits and alternatives for the proposed anesthesia with the patient or authorized representative who has indicated his/her understanding and acceptance.   Dental advisory given  Plan Discussed with: CRNA and Anesthesiologist  Anesthesia Plan Comments:         Anesthesia Quick Evaluation

## 2016-12-11 NOTE — Anesthesia Procedure Notes (Signed)
Procedure Name: LMA Insertion Date/Time: 12/11/2016 11:57 AM Performed by: Maris BergerENENNY, Kaitlyn Broadnax T Pre-anesthesia Checklist: Patient identified, Emergency Drugs available, Suction available and Patient being monitored Patient Re-evaluated:Patient Re-evaluated prior to inductionOxygen Delivery Method: Circle system utilized Preoxygenation: Pre-oxygenation with 100% oxygen Intubation Type: IV induction Ventilation: Mask ventilation without difficulty LMA: LMA inserted LMA Size: 4.0 Number of attempts: 1 Airway Equipment and Method: Bite block Placement Confirmation: positive ETCO2 Tube secured with: Tape Dental Injury: Teeth and Oropharynx as per pre-operative assessment

## 2016-12-11 NOTE — Brief Op Note (Signed)
12/11/2016  12:13 PM  PATIENT:  Kaitlyn [name]  61 y.o. female  PRE-OPERATIVE DIAGNOSIS:  INTERSTITIAL CYSTITIS  POST-OPERATIVE DIAGNOSIS: Same PROCEDURE:  Procedure(s): CYSTOSCOPY/HYDRODISTENSION (N/A)  INSTILLATION OF PYRIDIUM AND MARCAINE SURGEON:  Surgeon(s) and Role:    * Bjorn PippinJohn Chancy Smigiel, MD - Primary  PHYSICIAN ASSISTANT:   ASSISTANTS: none   ANESTHESIA:   general  EBL:  Total I/O In: 100 [I.V.:100] Out: 0   BLOOD ADMINISTERED:none  DRAINS: Urinary Catheter (Foley)   LOCAL MEDICATIONS USED:  MARCAINE    and Amount: 15 ml 0.5% with 400mg  pyridium  SPECIMEN:  No Specimen  DISPOSITION OF SPECIMEN:  N/A  COUNTS:  YES  TOURNIQUET:  * No tourniquets in log *  DICTATION: .Other Dictation: Dictation Number [id number]  PLAN OF CARE: Discharge to home after PACU  PATIENT DISPOSITION:  PACU - hemodynamically stable.   Delay start of Pharmacological VTE agent (>24hrs) due to surgical blood loss or risk of bleeding: not applicable

## 2016-12-11 NOTE — Anesthesia Postprocedure Evaluation (Addendum)
Anesthesia Post Note  Patient: Kaitlyn [name]  Procedure(s) Performed: Procedure(s) (LRB): CYSTOSCOPY/HYDRODISTENSION (N/A)  Patient location during evaluation: PACU Anesthesia Type: General Level of consciousness: awake, awake and alert and oriented Pain management: pain level controlled Vital Signs Assessment: post-procedure vital signs reviewed and stable Respiratory status: spontaneous breathing, nonlabored ventilation and respiratory function stable Cardiovascular status: blood pressure returned to baseline Anesthetic complications: no       Last Vitals:  Vitals:   12/11/16 1315 12/11/16 1405  BP: (!) 132/91 120/78  Pulse: 87 90  Resp: (!) 25 14  Temp:  36.8 C    Last Pain:  Vitals:   12/11/16 1413  TempSrc:   PainSc: 3                  Gia Lusher COKER

## 2016-12-11 NOTE — Transfer of Care (Signed)
Immediate Anesthesia Transfer of Care Note  Patient: Kaitlyn [name]  Procedure(s) Performed: Procedure(s): CYSTOSCOPY/HYDRODISTENSION (N/A)  Patient Location: PACU  Anesthesia Type:General  Level of Consciousness: sedated, unresponsive and responds to stimulation  Airway & Oxygen Therapy: Patient Spontanous Breathing and Patient connected to nasal cannula oxygen  Post-op Assessment: Report given to RN  Post vital signs: Reviewed and stable  Last Vitals:  Vitals:   12/11/16 1024 12/11/16 1225  BP: 140/78 121/82  Pulse: 89 98  Resp: 16 14  Temp: 36.9 C 36.5 C    Last Pain:  Vitals:   12/11/16 1045  TempSrc:   PainSc: 4       Patients Stated Pain Goal: 8 (12/11/16 1045)  Complications: No apparent anesthesia complications

## 2016-12-15 ENCOUNTER — Encounter (HOSPITAL_BASED_OUTPATIENT_CLINIC_OR_DEPARTMENT_OTHER): Payer: Self-pay | Admitting: Urology

## 2016-12-15 NOTE — Op Note (Signed)
NAME:  ALENE, BERGERSON                      ACCOUNT NO.:  MEDICAL RECORD NO.:  [id number]  LOCATION:                                 FACILITY:  PHYSICIAN:  Excell Seltzer. Annabell Howells, M.D.         DATE OF BIRTH:  DATE OF PROCEDURE:  12/11/2016 DATE OF DISCHARGE:                              OPERATIVE REPORT   PROCEDURE:  Cystoscopy with hydrodistention of the bladder and instillation of Pyridium and Marcaine.  PREOPERATIVE DIAGNOSIS:  Interstitial cystitis.  POSTOPERATIVE DIAGNOSIS:  Interstitial cystitis.  SURGEON:  Excell Seltzer. Annabell Howells, M.D.  ANESTHESIA:  General.  SPECIMEN:  None.  DRAINS:  Foley catheter.  COMPLICATIONS:  None.  INDICATIONS:  Ms. Fran Lowes is a 61 year old white female with a long history of interstitial cystitis, who requires intermittent hydrodistention for pain relief.  FINDINGS AND PROCEDURE:  She was given antibiotics.  She was taken to the operating room where general anesthetic was induced.  She was placed in lithotomy position and fitted with PAS hose.  Her perineum and genitalia were prepped with Betadine solution.  She was draped in usual sterile fashion.  Cystoscopy was performed using the 23-French scope and the 30-degree lens, examination revealed a normal urethra.  The bladder wall was smooth and pale.  Ureteral orifices were in the normal anatomic position effluxing clear urine.  No tumors, stones, or inflammation were noted.  The bladder was then filled under 80 cm of water pressure to capacity. Once capacity, the bladder was drained.  Her capacity under anesthesia was 500 mL.  The terminal efflux was bloody and on re-cystoscopy, there were glomerulations on the lateral walls consistent with interstitial cystitis.  The cystoscope was removed and a 14-French red rubber catheter was used to instill 15 mL of 0.5% Marcaine with 400 mg of crushed Pyridium.  The red rubber catheter was removed and plugged and 16-French Foley catheter was inserted.  The balloon  was filled with 10 mL of sterile fluid.  She will have the catheter unplugged in PACU and will be discharged home with a Foley.  At this point, she was taken down from lithotomy position.  Her anesthetic was reversed.  She was moved to the recovery room in stable condition.     Excell Seltzer. Annabell Howells, M.D.     JJW/MEDQ  D:  12/11/2016  T:  12/11/2016  Job:  [id number]

## 2017-05-07 NOTE — Addendum Note (Signed)
Addendum  created 05/07/17 1151 by Wymon Swaney, MD   Sign clinical note    

## 2017-07-10 ENCOUNTER — Other Ambulatory Visit: Payer: Self-pay | Admitting: Rehabilitation

## 2017-07-10 DIAGNOSIS — M87059 Idiopathic aseptic necrosis of unspecified femur: Secondary | ICD-10-CM

## 2017-07-15 ENCOUNTER — Ambulatory Visit
Admission: RE | Admit: 2017-07-15 | Discharge: 2017-07-15 | Disposition: A | Discharge status: Home / Self Care | Payer: BLUE CROSS/BLUE SHIELD | Source: Ambulatory Visit | Attending: Rehabilitation | Admitting: Rehabilitation

## 2017-07-15 DIAGNOSIS — M87059 Idiopathic aseptic necrosis of unspecified femur: Secondary | ICD-10-CM

## 2017-08-31 ENCOUNTER — Ambulatory Visit (INDEPENDENT_AMBULATORY_CARE_PROVIDER_SITE_OTHER): Payer: BLUE CROSS/BLUE SHIELD | Admitting: Orthopaedic Surgery

## 2017-08-31 ENCOUNTER — Encounter (INDEPENDENT_AMBULATORY_CARE_PROVIDER_SITE_OTHER): Payer: Self-pay | Admitting: Orthopaedic Surgery

## 2017-08-31 ENCOUNTER — Ambulatory Visit (INDEPENDENT_AMBULATORY_CARE_PROVIDER_SITE_OTHER): Payer: BLUE CROSS/BLUE SHIELD

## 2017-08-31 DIAGNOSIS — M87052 Idiopathic aseptic necrosis of left femur: Secondary | ICD-10-CM | POA: Diagnosis not present

## 2017-08-31 DIAGNOSIS — M87051 Idiopathic aseptic necrosis of right femur: Secondary | ICD-10-CM | POA: Diagnosis not present

## 2017-08-31 DIAGNOSIS — M25552 Pain in left hip: Secondary | ICD-10-CM

## 2017-08-31 DIAGNOSIS — M25551 Pain in right hip: Secondary | ICD-10-CM | POA: Diagnosis not present

## 2017-08-31 NOTE — Progress Notes (Signed)
Office Visit Note   Patient: Kaitlyn [name]           Date of Birth: 1956-07-14           MRN: [id number] Visit Date: 08/31/2017              Requested by: Letta KocherSaullo, Thomas, MD [address] [place], [place] [address] PCP: System, Pcp Not In   Assessment & Plan: Visit Diagnoses:  1. Bilateral hip pain   2. Avascular necrosis of hip, left (HCC)   3. Avascular necrosis of hip, right (HCC)     Plan: Given the severity of her avascular necrosis of both hips and impending femoral head collapse I do feel that we would need to proceed with bilateral hip replacements.  He had a long and thorough discussion about this.  Both her hips are terrible feeling to her in both hips are equal radiographically in terms of plain films and MRI and showing impending femoral head collapse.  From body habitus standpoint I feel comfortable proceeding to the surgery.  I did counsel her about smoking.  We will work on getting the surgery as scheduled.  We had a long and thorough discussion showing her hip model and going overall good risk and benefits of surgery as well as talked about the preoperative and postoperative course and intraoperative.  All questions and concerns were answered and addressed.  We will work on getting this scheduled sometime early in the new year.  We would then see her back in 2 weeks postoperative.  Follow-Up Instructions: Return for 2 weeks post-op.   Orders:  Orders Placed This Encounter  Procedures  . XR HIPS BILAT W OR W/O PELVIS 2V   No orders of the defined types were placed in this encounter.     Procedures: No procedures performed   Clinical Data: No additional findings.   Subjective: No chief complaint on file. The patient comes in today as a referral for bilateral hip avascular necrosis.  She has severe pain in both her hips is been worsening since July of this year.  She lives with a cane.  She has an MRI showing severe end-stage avascular necrosis of  both hips.  She has never been a drinker.  She denies any steroid use.  This is idiopathic avascular necrosis.  Her pain is daily and is 10 out of 10.  She is unable to work because of this.  It is detrimentally affected x-rays daily living, quality of life, and mobility.  She is now diabetic.  She is not overweight.  She is a smoker.  HPI  Review of Systems She currently denies any headache, chest pain, shortness of breath, fever, chills, nausea, vomiting  Objective: Vital Signs: There were no vitals taken for this visit.  Physical Exam She is alert and oriented x3 and in no acute distress Ortho Exam Examination of both hips show severe pain with internal and external rotation. Specialty Comments:  No specialty comments available.  Imaging: Xr Hips Bilat W Or W/o Pelvis 2v  Result Date: 08/31/2017 An AP pelvis and lateral both hips show significant irregularities in both femoral heads consistent with end-stage avascular necrosis.  There is impending collapse of both hips.  The MRI of her pelvis is independently reviewed and shows severe end-stage avascular necrosis of both hips.  There is significant effusion of both hip joints.  There is impending femoral head collapse of both hips.  There is edematous changes that extend  to the femoral neck and trochanteric region on both hips.  PMFS History: Patient Active Problem List   Diagnosis Date Noted  . Bilateral hip pain 08/31/2017  . Avascular necrosis of hip, left (HCC) 08/31/2017  . Avascular necrosis of hip, right (HCC) 08/31/2017   Past Medical History:  Diagnosis Date  . Depression   . Frequency of urination   . GERD (gastroesophageal reflux disease)    CONTROLLED W/ PRILOSEC  . Hyperlipemia   . Hypothyroidism   . Incontinence of urine   . Insomnia   . Interstitial cystitis   . Pelvic pain syndrome I.C.  . PONV (postoperative nausea and vomiting)   . Urgency of urination   . Wears dentures    UPPER  . Wears glasses       History reviewed. No pertinent family history.  Past Surgical History:  Procedure Laterality Date  . CYSTO WITH HYDRODISTENSION  09/18/2011   Procedure: CYSTOSCOPY/HYDRODISTENSION;  Surgeon: Anner CreteJohn J Wrenn;  Location: [facility];  Service: Urology;  Laterality: N/A;  Instillation of Marcaine & Pyridium  . CYSTO WITH HYDRODISTENSION N/A 05/31/2013   Procedure: CYSTOSCOPY/HYDRODISTENSION INSTALLATION OF MARCAINE/PYRIDIUM;  Surgeon: Antony HasteMatthew Ramsey Eskridge, MD;  [facility] [place];  Service: Urology;  Laterality: N/A;  . CYSTO WITH HYDRODISTENSION N/A 01/24/2014   Procedure: CYSTOSCOPY/HYDRODISTENSION INSTALLATION OF MARCAINE AND PYRIDIUM;  Surgeon: Anner CreteJohn J Wrenn, MD;  [facility] [place];  Service: Urology;  Laterality: N/A;  . CYSTO WITH HYDRODISTENSION N/A 08/03/2014   Procedure: CYSTO/HYDRODISTENSION OF BLADDER/INSTILL  PYRIDIUM/MARCAINE;  Surgeon: Anner CreteJohn J Wrenn, MD;  [facility] [place];  Service: Urology;  Laterality: N/A;  . CYSTO WITH HYDRODISTENSION N/A 11/08/2015   Procedure: CYSTOSCOPY/HYDRODISTENSION;  Surgeon: Bjorn PippinJohn Wrenn, MD;  [facility] [place];  Service: Urology;  Laterality: N/A;  . CYSTO WITH HYDRODISTENSION N/A 12/11/2016   Procedure: CYSTOSCOPY/HYDRODISTENSION;  Surgeon: Bjorn PippinJohn Wrenn, MD;  [facility] [place];  Service: Urology;  Laterality: N/A;  . CYSTO/ HOD/ INSTILLATION THERAPY  LAST ONE 08-27-2010   MULTIPLE TIMES  . TRANSTHORACIC ECHOCARDIOGRAM  07-06-2012   mild LVH, ef 55-65%, grade 1 diastolic dysfunction/  mild AR/  trivial MR and TR  . VAGINAL HYSTERECTOMY  1980'S   Social History   Occupational History  . Not on file  Tobacco Use  . Smoking status: Current Every Day Smoker    Packs/day: 0.50    Years: 13.00    Pack years: 6.50    Types: Cigarettes  . Smokeless tobacco: Never Used  Substance and Sexual Activity  . Alcohol use: No  . Drug use: Not on file  .  Sexual activity: Not on file

## 2017-10-01 ENCOUNTER — Telehealth (INDEPENDENT_AMBULATORY_CARE_PROVIDER_SITE_OTHER): Payer: Self-pay | Admitting: Orthopaedic Surgery

## 2017-10-01 NOTE — Telephone Encounter (Signed)
Pt called and would like to be sched for surgery

## 2017-10-08 ENCOUNTER — Telehealth (INDEPENDENT_AMBULATORY_CARE_PROVIDER_SITE_OTHER): Payer: Self-pay | Admitting: Orthopaedic Surgery

## 2017-10-08 NOTE — Telephone Encounter (Signed)
I called patient and scheduled surgery. 

## 2017-10-08 NOTE — Telephone Encounter (Signed)
Patient called wanting to set up her surgery, said she was told her surgery would be in Jan. CB # [phone]

## 2017-10-20 ENCOUNTER — Other Ambulatory Visit (INDEPENDENT_AMBULATORY_CARE_PROVIDER_SITE_OTHER): Payer: Self-pay | Admitting: Physician Assistant

## 2017-10-22 NOTE — Patient Instructions (Signed)
Kaitlyn [name]  10/22/2017   Your procedure is scheduled on:11-06-17   Report to [facility] Main  Entrance  Report to admitting at      [phone]   Call this number if you have problems the morning of surgery [phone]    Remember: Do not eat food or drink liquids :After Midnight.     Take these medicines the morning of surgery with A SIP OF WATER: omeprazole, lexapro                                You may not have any metal on your body including hair pins and              piercings  Do not wear jewelry, make-up, lotions, powders or perfumes, deodorant             Do not wear nail polish.  Do not shave  48 hours prior to surgery.     Do not bring valuables to the hospital. [facility] IS NOT             RESPONSIBLE   FOR VALUABLES.  Contacts, dentures or bridgework may not be worn into surgery.  Leave suitcase in the car. After surgery it may be brought to your room.               Please read over the following fact sheets you were given: ____________________________________________________________________          [facility] - Preparing for Surgery Before surgery, you can play an important role.  Because skin is not sterile, your skin needs to be as free of germs as possible.  You can reduce the number of germs on your skin by washing with CHG (chlorahexidine gluconate) soap before surgery.  CHG is an antiseptic cleaner which kills germs and bonds with the skin to continue killing germs even after washing. Please DO NOT use if you have an allergy to CHG or antibacterial soaps.  If your skin becomes reddened/irritated stop using the CHG and inform your nurse when you arrive at Short Stay. Do not shave (including legs and underarms) for at least 48 hours prior to the first CHG shower.  You may shave your face/neck. Please follow these instructions carefully:  1.  Shower with CHG Soap the night before surgery and the  morning of Surgery.  2.  If you choose to  wash your hair, wash your hair first as usual with your  normal  shampoo.  3.  After you shampoo, rinse your hair and body thoroughly to remove the  shampoo.                           4.  Use CHG as you would any other liquid soap.  You can apply chg directly  to the skin and wash                       Gently with a scrungie or clean washcloth.  5.  Apply the CHG Soap to your body ONLY FROM THE NECK DOWN.   Do not use on face/ open                           Wound  or open sores. Avoid contact with eyes, ears mouth and genitals (private parts).                       Wash face,  Genitals (private parts) with your normal soap.             6.  Wash thoroughly, paying special attention to the area where your surgery  will be performed.  7.  Thoroughly rinse your body with warm water from the neck down.  8.  DO NOT shower/wash with your normal soap after using and rinsing off  the CHG Soap.                9.  Pat yourself dry with a clean towel.            10.  Wear clean pajamas.            11.  Place clean sheets on your bed the night of your first shower and do not  sleep with pets. Day of Surgery : Do not apply any lotions/deodorants the morning of surgery.  Please wear clean clothes to the hospital/surgery center.  FAILURE TO FOLLOW THESE INSTRUCTIONS MAY RESULT IN THE CANCELLATION OF YOUR SURGERY PATIENT SIGNATURE_________________________________  NURSE SIGNATURE__________________________________  ________________________________________________________________________  WHAT IS A BLOOD TRANSFUSION? Blood Transfusion Information  A transfusion is the replacement of blood or some of its parts. Blood is made up of multiple cells which provide different functions.  Red blood cells carry oxygen and are used for blood loss replacement.  White blood cells fight against infection.  Platelets control bleeding.  Plasma helps clot blood.  Other blood products are available for specialized  needs, such as hemophilia or other clotting disorders. BEFORE THE TRANSFUSION  Who gives blood for transfusions?   Healthy volunteers who are fully evaluated to make sure their blood is safe. This is blood bank blood. Transfusion therapy is the safest it has ever been in the practice of medicine. Before blood is taken from a donor, a complete history is taken to make sure that person has no history of diseases nor engages in risky social behavior (examples are intravenous drug use or sexual activity with multiple partners). The donor's travel history is screened to minimize risk of transmitting infections, such as malaria. The donated blood is tested for signs of infectious diseases, such as HIV and hepatitis. The blood is then tested to be sure it is compatible with you in order to minimize the chance of a transfusion reaction. If you or a relative donates blood, this is often done in anticipation of surgery and is not appropriate for emergency situations. It takes many days to process the donated blood. RISKS AND COMPLICATIONS Although transfusion therapy is very safe and saves many lives, the main dangers of transfusion include:   Getting an infectious disease.  Developing a transfusion reaction. This is an allergic reaction to something in the blood you were given. Every precaution is taken to prevent this. The decision to have a blood transfusion has been considered carefully by your caregiver before blood is given. Blood is not given unless the benefits outweigh the risks. AFTER THE TRANSFUSION  Right after receiving a blood transfusion, you will usually feel much better and more energetic. This is especially true if your red blood cells have gotten low (anemic). The transfusion raises the level of the red blood cells which carry oxygen, and this usually causes an energy increase.  The nurse  administering the transfusion will monitor you carefully for complications. HOME CARE INSTRUCTIONS   No special instructions are needed after a transfusion. You may find your energy is better. Speak with your caregiver about any limitations on activity for underlying diseases you may have. SEEK MEDICAL CARE IF:   Your condition is not improving after your transfusion.  You develop redness or irritation at the intravenous (IV) site. SEEK IMMEDIATE MEDICAL CARE IF:  Any of the following symptoms occur over the next 12 hours:  Shaking chills.  You have a temperature by mouth above 102 F (38.9 C), not controlled by medicine.  Chest, back, or muscle pain.  People around you feel you are not acting correctly or are confused.  Shortness of breath or difficulty breathing.  Dizziness and fainting.  You get a rash or develop hives.  You have a decrease in urine output.  Your urine turns a dark color or changes to pink, red, or brown. Any of the following symptoms occur over the next 10 days:  You have a temperature by mouth above 102 F (38.9 C), not controlled by medicine.  Shortness of breath.  Weakness after normal activity.  The white part of the eye turns yellow (jaundice).  You have a decrease in the amount of urine or are urinating less often.  Your urine turns a dark color or changes to pink, red, or brown. Document Released: 08/29/2000 Document Revised: 11/24/2011 Document Reviewed: 04/17/2008 ExitCare Patient Information 2014 [place], [place].  _______________________________________________________________________  Incentive Spirometer  An incentive spirometer is a tool that can help keep your lungs clear and active. This tool measures how well you are filling your lungs with each breath. Taking long deep breaths may help reverse or decrease the chance of developing breathing (pulmonary) problems (especially infection) following:  A long period of time when you are unable to move or be active. BEFORE THE PROCEDURE   If the spirometer includes an indicator to  show your best effort, your nurse or respiratory therapist will set it to a desired goal.  If possible, sit up straight or lean slightly forward. Try not to slouch.  Hold the incentive spirometer in an upright position. INSTRUCTIONS FOR USE  1. Sit on the edge of your bed if possible, or sit up as far as you can in bed or on a chair. 2. Hold the incentive spirometer in an upright position. 3. Breathe out normally. 4. Place the mouthpiece in your mouth and seal your lips tightly around it. 5. Breathe in slowly and as deeply as possible, raising the piston or the ball toward the top of the column. 6. Hold your breath for 3-5 seconds or for as long as possible. Allow the piston or ball to fall to the bottom of the column. 7. Remove the mouthpiece from your mouth and breathe out normally. 8. Rest for a few seconds and repeat Steps 1 through 7 at least 10 times every 1-2 hours when you are awake. Take your time and take a few normal breaths between deep breaths. 9. The spirometer may include an indicator to show your best effort. Use the indicator as a goal to work toward during each repetition. 10. After each set of 10 deep breaths, practice coughing to be sure your lungs are clear. If you have an incision (the cut made at the time of surgery), support your incision when coughing by placing a pillow or rolled up towels firmly against it. Once you are able to get out  of bed, walk around indoors and cough well. You may stop using the incentive spirometer when instructed by your caregiver.  RISKS AND COMPLICATIONS  Take your time so you do not get dizzy or light-headed.  If you are in pain, you may need to take or ask for pain medication before doing incentive spirometry. It is harder to take a deep breath if you are having pain. AFTER USE  Rest and breathe slowly and easily.  It can be helpful to keep track of a log of your progress. Your caregiver can provide you with a simple table to help with  this. If you are using the spirometer at home, follow these instructions: SEEK MEDICAL CARE IF:   You are having difficultly using the spirometer.  You have trouble using the spirometer as often as instructed.  Your pain medication is not giving enough relief while using the spirometer.  You develop fever of 100.5 F (38.1 C) or higher. SEEK IMMEDIATE MEDICAL CARE IF:   You cough up bloody sputum that had not been present before.  You develop fever of 102 F (38.9 C) or greater.  You develop worsening pain at or near the incision site. MAKE SURE YOU:   Understand these instructions.  Will watch your condition.  Will get help right away if you are not doing well or get worse. Document Released: 01/12/2007 Document Revised: 11/24/2011 Document Reviewed: 03/15/2007 [facility] Patient Information 2014 [place], [place].   ________________________________________________________________________

## 2017-10-28 ENCOUNTER — Other Ambulatory Visit: Payer: Self-pay

## 2017-10-28 ENCOUNTER — Other Ambulatory Visit (INDEPENDENT_AMBULATORY_CARE_PROVIDER_SITE_OTHER): Payer: Self-pay

## 2017-10-28 ENCOUNTER — Encounter (HOSPITAL_COMMUNITY): Payer: Self-pay

## 2017-10-28 ENCOUNTER — Encounter (HOSPITAL_COMMUNITY)
Admission: RE | Admit: 2017-10-28 | Discharge: 2017-10-28 | Disposition: A | Discharge status: Home / Self Care | Payer: BLUE CROSS/BLUE SHIELD | Source: Ambulatory Visit | Attending: Orthopaedic Surgery | Admitting: Orthopaedic Surgery

## 2017-10-28 DIAGNOSIS — Z01810 Encounter for preprocedural cardiovascular examination: Secondary | ICD-10-CM | POA: Diagnosis not present

## 2017-10-28 DIAGNOSIS — Z01812 Encounter for preprocedural laboratory examination: Secondary | ICD-10-CM | POA: Insufficient documentation

## 2017-10-28 HISTORY — DX: Headache, unspecified: R51.9

## 2017-10-28 HISTORY — DX: Unspecified osteoarthritis, unspecified site: M19.90

## 2017-10-28 HISTORY — DX: Headache: R51

## 2017-10-28 LAB — BASIC METABOLIC PANEL
Anion gap: 9 (ref 5–15)
BUN: 19 mg/dL (ref 6–20)
CHLORIDE: 106 mmol/L (ref 101–111)
CO2: 26 mmol/L (ref 22–32)
Calcium: 9.5 mg/dL (ref 8.9–10.3)
Creatinine, Ser: 1.35 mg/dL — ABNORMAL HIGH (ref 0.44–1.00)
GFR calc Af Amer: 48 mL/min — ABNORMAL LOW (ref >60)
GFR calc non Af Amer: 41 mL/min — ABNORMAL LOW (ref >60)
GLUCOSE: 83 mg/dL (ref 65–99)
POTASSIUM: 4.7 mmol/L (ref 3.5–5.1)
Sodium: 141 mmol/L (ref 135–145)

## 2017-10-28 LAB — CBC
HEMATOCRIT: 40.9 % (ref 36.0–46.0)
Hemoglobin: 12.8 g/dL (ref 12.0–15.0)
MCH: 29.4 pg (ref 26.0–34.0)
MCHC: 31.3 g/dL (ref 30.0–36.0)
MCV: 94 fL (ref 78.0–100.0)
Platelets: 325 10*3/uL (ref 150–400)
RBC: 4.35 MIL/uL (ref 3.87–5.11)
RDW: 16.2 % — ABNORMAL HIGH (ref 11.5–15.5)
WBC: 10.6 10*3/uL — ABNORMAL HIGH (ref 4.0–10.5)

## 2017-10-28 LAB — SURGICAL PCR SCREEN
MRSA, PCR: NEGATIVE
Staphylococcus aureus: POSITIVE — AB

## 2017-10-28 NOTE — Progress Notes (Signed)
cxr 08-28-17 care everywhere

## 2017-10-29 LAB — ABO/RH: ABO/RH(D): O POS

## 2017-11-05 MED ORDER — TRANEXAMIC ACID 1000 MG/10ML IV SOLN
1000.0000 mg | INTRAVENOUS | Status: AC
  Administered 2017-11-06: 1000 mg via INTRAVENOUS
  Filled 2017-11-05: qty 1100

## 2017-11-06 ENCOUNTER — Inpatient Hospital Stay (HOSPITAL_COMMUNITY): Payer: BLUE CROSS/BLUE SHIELD

## 2017-11-06 ENCOUNTER — Inpatient Hospital Stay (HOSPITAL_COMMUNITY): Payer: BLUE CROSS/BLUE SHIELD | Admitting: Certified Registered Nurse Anesthetist

## 2017-11-06 ENCOUNTER — Other Ambulatory Visit: Payer: Self-pay

## 2017-11-06 ENCOUNTER — Encounter (HOSPITAL_COMMUNITY)
Admission: RE | Disposition: A | Discharge status: Skilled Nursing Facility | Payer: Self-pay | Source: Ambulatory Visit | Attending: Orthopaedic Surgery

## 2017-11-06 ENCOUNTER — Encounter (HOSPITAL_COMMUNITY): Payer: Self-pay | Admitting: *Deleted

## 2017-11-06 ENCOUNTER — Inpatient Hospital Stay (HOSPITAL_COMMUNITY)
Admission: RE | Admit: 2017-11-06 | Discharge: 2017-11-10 | DRG: 462 | Disposition: A | Discharge status: Skilled Nursing Facility | Payer: BLUE CROSS/BLUE SHIELD | Source: Ambulatory Visit | Attending: Orthopaedic Surgery | Admitting: Orthopaedic Surgery

## 2017-11-06 DIAGNOSIS — M87052 Idiopathic aseptic necrosis of left femur: Secondary | ICD-10-CM | POA: Diagnosis present

## 2017-11-06 DIAGNOSIS — M25551 Pain in right hip: Secondary | ICD-10-CM

## 2017-11-06 DIAGNOSIS — K219 Gastro-esophageal reflux disease without esophagitis: Secondary | ICD-10-CM | POA: Diagnosis present

## 2017-11-06 DIAGNOSIS — Z87891 Personal history of nicotine dependence: Secondary | ICD-10-CM

## 2017-11-06 DIAGNOSIS — M87051 Idiopathic aseptic necrosis of right femur: Secondary | ICD-10-CM | POA: Diagnosis not present

## 2017-11-06 DIAGNOSIS — E785 Hyperlipidemia, unspecified: Secondary | ICD-10-CM | POA: Diagnosis present

## 2017-11-06 DIAGNOSIS — E039 Hypothyroidism, unspecified: Secondary | ICD-10-CM | POA: Diagnosis present

## 2017-11-06 DIAGNOSIS — D62 Acute posthemorrhagic anemia: Secondary | ICD-10-CM | POA: Diagnosis not present

## 2017-11-06 DIAGNOSIS — F329 Major depressive disorder, single episode, unspecified: Secondary | ICD-10-CM | POA: Diagnosis present

## 2017-11-06 DIAGNOSIS — Z419 Encounter for procedure for purposes other than remedying health state, unspecified: Secondary | ICD-10-CM

## 2017-11-06 DIAGNOSIS — Z882 Allergy status to sulfonamides status: Secondary | ICD-10-CM

## 2017-11-06 DIAGNOSIS — M25552 Pain in left hip: Secondary | ICD-10-CM

## 2017-11-06 DIAGNOSIS — Z888 Allergy status to other drugs, medicaments and biological substances status: Secondary | ICD-10-CM | POA: Diagnosis not present

## 2017-11-06 DIAGNOSIS — R000 Tachycardia, unspecified: Secondary | ICD-10-CM | POA: Diagnosis not present

## 2017-11-06 DIAGNOSIS — Z96643 Presence of artificial hip joint, bilateral: Secondary | ICD-10-CM

## 2017-11-06 HISTORY — PX: BILATERAL ANTERIOR TOTAL HIP ARTHROPLASTY: SHX5567

## 2017-11-06 LAB — TYPE AND SCREEN
ABO/RH(D): O POS
ANTIBODY SCREEN: NEGATIVE

## 2017-11-06 SURGERY — ARTHROPLASTY, HIP, BILATERAL, TOTAL, ANTERIOR APPROACH
Anesthesia: Spinal | Site: Hip | Laterality: Bilateral

## 2017-11-06 MED ORDER — METHOCARBAMOL 500 MG PO TABS
500.0000 mg | ORAL_TABLET | Freq: Four times a day (QID) | ORAL | Status: DC | PRN
  Administered 2017-11-06 – 2017-11-10 (×5): 500 mg via ORAL
  Filled 2017-11-06 (×5): qty 1

## 2017-11-06 MED ORDER — DEXAMETHASONE SODIUM PHOSPHATE 10 MG/ML IJ SOLN
INTRAMUSCULAR | Status: DC | PRN
  Administered 2017-11-06: 10 mg via INTRAVENOUS

## 2017-11-06 MED ORDER — POLYETHYLENE GLYCOL 3350 17 G PO PACK
17.0000 g | PACK | Freq: Every day | ORAL | Status: DC | PRN
  Administered 2017-11-10: 08:00:00 17 g via ORAL
  Filled 2017-11-06: qty 1

## 2017-11-06 MED ORDER — HYDROMORPHONE HCL 1 MG/ML IJ SOLN
INTRAMUSCULAR | Status: AC
  Filled 2017-11-06: qty 2

## 2017-11-06 MED ORDER — BUPIVACAINE HCL (PF) 0.5 % IJ SOLN
INTRAMUSCULAR | Status: DC | PRN
  Administered 2017-11-06: 2 mL via INTRATHECAL

## 2017-11-06 MED ORDER — PROPOFOL 500 MG/50ML IV EMUL
INTRAVENOUS | Status: DC | PRN
  Administered 2017-11-06: 50 ug/kg/min via INTRAVENOUS

## 2017-11-06 MED ORDER — SODIUM CHLORIDE 0.9 % IR SOLN
Status: DC | PRN
  Administered 2017-11-06: 1000 mL

## 2017-11-06 MED ORDER — ALUM & MAG HYDROXIDE-SIMETH 200-200-20 MG/5ML PO SUSP: 30.0000 mL | ORAL | Status: DC | PRN

## 2017-11-06 MED ORDER — METHOCARBAMOL 1000 MG/10ML IJ SOLN
500.0000 mg | Freq: Four times a day (QID) | INTRAVENOUS | Status: DC | PRN
  Administered 2017-11-06: 500 mg via INTRAVENOUS
  Filled 2017-11-06: qty 550

## 2017-11-06 MED ORDER — ONDANSETRON HCL 4 MG/2ML IJ SOLN: 4.0000 mg | Freq: Four times a day (QID) | INTRAMUSCULAR | Status: DC | PRN

## 2017-11-06 MED ORDER — CHLORHEXIDINE GLUCONATE 4 % EX LIQD: 60.0000 mL | Freq: Once | CUTANEOUS | Status: DC

## 2017-11-06 MED ORDER — METOCLOPRAMIDE HCL 5 MG PO TABS: 5.0000 mg | ORAL_TABLET | Freq: Three times a day (TID) | ORAL | Status: DC | PRN

## 2017-11-06 MED ORDER — HYDROMORPHONE HCL 1 MG/ML IJ SOLN
0.2500 mg | INTRAMUSCULAR | Status: DC | PRN
  Administered 2017-11-06 (×3): 0.25 mg via INTRAVENOUS

## 2017-11-06 MED ORDER — HYDROCODONE-ACETAMINOPHEN 5-325 MG PO TABS
1.0000 | ORAL_TABLET | ORAL | Status: DC | PRN
  Administered 2017-11-06: 1 via ORAL
  Administered 2017-11-07: 08:00:00 2 via ORAL
  Administered 2017-11-07: 1 via ORAL
  Administered 2017-11-09 (×4): 2 via ORAL
  Filled 2017-11-06: qty 1
  Filled 2017-11-06 (×2): qty 2
  Filled 2017-11-06: qty 1
  Filled 2017-11-06 (×3): qty 2

## 2017-11-06 MED ORDER — SODIUM CHLORIDE 0.9 % IV BOLUS (SEPSIS)
500.0000 mL | Freq: Once | INTRAVENOUS | Status: AC
  Administered 2017-11-06: 18:00:00 500 mL via INTRAVENOUS

## 2017-11-06 MED ORDER — FENTANYL CITRATE (PF) 100 MCG/2ML IJ SOLN
INTRAMUSCULAR | Status: DC | PRN
  Administered 2017-11-06 (×2): 50 ug via INTRAVENOUS

## 2017-11-06 MED ORDER — DIPHENHYDRAMINE HCL 12.5 MG/5ML PO ELIX: 12.5000 mg | ORAL_SOLUTION | ORAL | Status: DC | PRN

## 2017-11-06 MED ORDER — MENTHOL 3 MG MT LOZG: 1.0000 | LOZENGE | OROMUCOSAL | Status: DC | PRN

## 2017-11-06 MED ORDER — CEFAZOLIN SODIUM-DEXTROSE 2-4 GM/100ML-% IV SOLN
INTRAVENOUS | Status: AC
  Filled 2017-11-06: qty 100

## 2017-11-06 MED ORDER — LACTATED RINGERS IV SOLN: INTRAVENOUS | Status: DC

## 2017-11-06 MED ORDER — RIVAROXABAN 10 MG PO TABS
10.0000 mg | ORAL_TABLET | Freq: Every day | ORAL | Status: DC
  Administered 2017-11-07 – 2017-11-10 (×4): 10 mg via ORAL
  Filled 2017-11-06 (×4): qty 1

## 2017-11-06 MED ORDER — METOCLOPRAMIDE HCL 5 MG/ML IJ SOLN: 5.0000 mg | Freq: Three times a day (TID) | INTRAMUSCULAR | Status: DC | PRN

## 2017-11-06 MED ORDER — ACETAMINOPHEN 650 MG RE SUPP: 650.0000 mg | RECTAL | Status: DC | PRN

## 2017-11-06 MED ORDER — STERILE WATER FOR IRRIGATION IR SOLN
Status: DC | PRN
  Administered 2017-11-06: 2000 mL

## 2017-11-06 MED ORDER — ONDANSETRON HCL 4 MG PO TABS: 4.0000 mg | ORAL_TABLET | Freq: Four times a day (QID) | ORAL | Status: DC | PRN

## 2017-11-06 MED ORDER — ACETAMINOPHEN 325 MG PO TABS
650.0000 mg | ORAL_TABLET | ORAL | Status: DC | PRN
  Administered 2017-11-07: 05:00:00 650 mg via ORAL
  Filled 2017-11-06: qty 2

## 2017-11-06 MED ORDER — CEFAZOLIN SODIUM-DEXTROSE 2-4 GM/100ML-% IV SOLN
2.0000 g | INTRAVENOUS | Status: AC
  Administered 2017-11-06: 2 g via INTRAVENOUS

## 2017-11-06 MED ORDER — CEFAZOLIN SODIUM-DEXTROSE 1-4 GM/50ML-% IV SOLN
1.0000 g | Freq: Four times a day (QID) | INTRAVENOUS | Status: AC
  Administered 2017-11-06 (×2): 1 g via INTRAVENOUS
  Filled 2017-11-06 (×2): qty 50

## 2017-11-06 MED ORDER — FENTANYL CITRATE (PF) 100 MCG/2ML IJ SOLN
INTRAMUSCULAR | Status: AC
  Filled 2017-11-06: qty 2

## 2017-11-06 MED ORDER — DIVALPROEX SODIUM 250 MG PO DR TAB
500.0000 mg | DELAYED_RELEASE_TABLET | Freq: Every day | ORAL | Status: DC
  Administered 2017-11-06 – 2017-11-09 (×4): 500 mg via ORAL
  Filled 2017-11-06 (×4): qty 2

## 2017-11-06 MED ORDER — HYDROMORPHONE HCL 1 MG/ML IJ SOLN
1.0000 mg | INTRAMUSCULAR | Status: DC | PRN
  Administered 2017-11-06: 1 mg via INTRAVENOUS
  Filled 2017-11-06: qty 1

## 2017-11-06 MED ORDER — GABAPENTIN 300 MG PO CAPS
300.0000 mg | ORAL_CAPSULE | Freq: Two times a day (BID) | ORAL | Status: DC
  Administered 2017-11-06 – 2017-11-10 (×8): 300 mg via ORAL
  Filled 2017-11-06 (×8): qty 1

## 2017-11-06 MED ORDER — PHENYLEPHRINE HCL 10 MG/ML IJ SOLN
INTRAMUSCULAR | Status: DC | PRN
  Administered 2017-11-06: 50 ug/min via INTRAVENOUS

## 2017-11-06 MED ORDER — PHENYLEPHRINE HCL 10 MG/ML IJ SOLN
INTRAMUSCULAR | Status: DC | PRN
  Administered 2017-11-06 (×2): 40 ug via INTRAVENOUS
  Administered 2017-11-06: 80 ug via INTRAVENOUS
  Administered 2017-11-06: 40 ug via INTRAVENOUS
  Administered 2017-11-06 (×2): 80 ug via INTRAVENOUS

## 2017-11-06 MED ORDER — PANTOPRAZOLE SODIUM 40 MG PO TBEC
40.0000 mg | DELAYED_RELEASE_TABLET | Freq: Every day | ORAL | Status: DC
  Administered 2017-11-07 – 2017-11-10 (×4): 40 mg via ORAL
  Filled 2017-11-06 (×4): qty 1

## 2017-11-06 MED ORDER — ACETAMINOPHEN 500 MG PO TABS
1000.0000 mg | ORAL_TABLET | Freq: Once | ORAL | Status: AC
  Administered 2017-11-06: 1000 mg via ORAL
  Filled 2017-11-06: qty 2

## 2017-11-06 MED ORDER — OXYCODONE HCL 5 MG PO TABS
10.0000 mg | ORAL_TABLET | ORAL | Status: DC | PRN
  Administered 2017-11-07 – 2017-11-10 (×7): 10 mg via ORAL
  Filled 2017-11-06 (×7): qty 2

## 2017-11-06 MED ORDER — DOCUSATE SODIUM 100 MG PO CAPS
100.0000 mg | ORAL_CAPSULE | Freq: Two times a day (BID) | ORAL | Status: DC
  Administered 2017-11-06 – 2017-11-10 (×8): 100 mg via ORAL
  Filled 2017-11-06 (×8): qty 1

## 2017-11-06 MED ORDER — MIDAZOLAM HCL 5 MG/5ML IJ SOLN
INTRAMUSCULAR | Status: DC | PRN
  Administered 2017-11-06 (×2): 1 mg via INTRAVENOUS

## 2017-11-06 MED ORDER — LEVOTHYROXINE SODIUM 88 MCG PO TABS
88.0000 ug | ORAL_TABLET | Freq: Every day | ORAL | Status: DC
  Administered 2017-11-06 – 2017-11-09 (×4): 88 ug via ORAL
  Filled 2017-11-06 (×4): qty 1

## 2017-11-06 MED ORDER — ACETAMINOPHEN 10 MG/ML IV SOLN: 1000.0000 mg | Freq: Once | INTRAVENOUS | Status: DC

## 2017-11-06 MED ORDER — ONDANSETRON HCL 4 MG/2ML IJ SOLN
INTRAMUSCULAR | Status: DC | PRN
  Administered 2017-11-06: 4 mg via INTRAVENOUS

## 2017-11-06 MED ORDER — PROPOFOL 10 MG/ML IV BOLUS
INTRAVENOUS | Status: AC
  Filled 2017-11-06: qty 20

## 2017-11-06 MED ORDER — SODIUM CHLORIDE 0.9 % IV SOLN
INTRAVENOUS | Status: DC
  Administered 2017-11-06 – 2017-11-08 (×4): via INTRAVENOUS

## 2017-11-06 MED ORDER — PROPOFOL 10 MG/ML IV BOLUS
INTRAVENOUS | Status: AC
  Filled 2017-11-06: qty 40

## 2017-11-06 MED ORDER — ALBUMIN HUMAN 5 % IV SOLN
INTRAVENOUS | Status: AC
  Filled 2017-11-06: qty 250

## 2017-11-06 MED ORDER — PHENOL 1.4 % MT LIQD: 1.0000 | OROMUCOSAL | Status: DC | PRN

## 2017-11-06 MED ORDER — MIDAZOLAM HCL 2 MG/2ML IJ SOLN
INTRAMUSCULAR | Status: AC
  Filled 2017-11-06: qty 2

## 2017-11-06 MED ORDER — ESMOLOL HCL 100 MG/10ML IV SOLN
INTRAVENOUS | Status: DC | PRN
  Administered 2017-11-06: 10 mg via INTRAVENOUS

## 2017-11-06 MED ORDER — LACTATED RINGERS IV SOLN
INTRAVENOUS | Status: DC
  Administered 2017-11-06 (×2): via INTRAVENOUS

## 2017-11-06 MED ORDER — QUETIAPINE FUMARATE 50 MG PO TABS
200.0000 mg | ORAL_TABLET | Freq: Every day | ORAL | Status: DC
  Administered 2017-11-06 – 2017-11-09 (×4): 200 mg via ORAL
  Filled 2017-11-06 (×4): qty 4

## 2017-11-06 MED ORDER — ONDANSETRON HCL 4 MG/2ML IJ SOLN
INTRAMUSCULAR | Status: AC
  Filled 2017-11-06: qty 2

## 2017-11-06 MED ORDER — ESCITALOPRAM OXALATE 20 MG PO TABS
20.0000 mg | ORAL_TABLET | Freq: Every day | ORAL | Status: DC
  Administered 2017-11-06 – 2017-11-09 (×4): 20 mg via ORAL
  Filled 2017-11-06 (×4): qty 1

## 2017-11-06 MED ORDER — GABAPENTIN 300 MG PO CAPS
300.0000 mg | ORAL_CAPSULE | Freq: Once | ORAL | Status: AC
  Administered 2017-11-06: 300 mg via ORAL
  Filled 2017-11-06: qty 1

## 2017-11-06 MED ORDER — ALBUMIN HUMAN 5 % IV SOLN
12.5000 g | Freq: Once | INTRAVENOUS | Status: AC
  Administered 2017-11-06: 12.5 g via INTRAVENOUS

## 2017-11-06 SURGICAL SUPPLY — 48 items
BAG ZIPLOCK 12X15 (MISCELLANEOUS) IMPLANT
BLADE SAW SGTL 18X1.27X75 (BLADE) ×4 IMPLANT
BLADE SURG SZ10 CARB STEEL (BLADE) ×4 IMPLANT
CAPT HIP TOTAL 2 ×4 IMPLANT
CELLS DAT CNTRL 66122 CELL SVR (MISCELLANEOUS) IMPLANT
CLOTH BEACON ORANGE TIMEOUT ST (SAFETY) ×2 IMPLANT
COVER PERINEAL POST (MISCELLANEOUS) ×2 IMPLANT
COVER SURGICAL LIGHT HANDLE (MISCELLANEOUS) ×4 IMPLANT
DERMABOND ADVANCED (GAUZE/BANDAGES/DRESSINGS)
DERMABOND ADVANCED .7 DNX12 (GAUZE/BANDAGES/DRESSINGS) IMPLANT
DRAPE C-ARM 42X120 X-RAY (DRAPES) ×2 IMPLANT
DRAPE STERI IOBAN 125X83 (DRAPES) ×4 IMPLANT
DRAPE U-SHAPE 47X51 STRL (DRAPES) ×14 IMPLANT
DRSG AQUACEL AG ADV 3.5X10 (GAUZE/BANDAGES/DRESSINGS) ×4 IMPLANT
DURAPREP 26ML APPLICATOR (WOUND CARE) ×4 IMPLANT
ELECT BLADE TIP CTD 4 INCH (ELECTRODE) ×2 IMPLANT
ELECT PENCIL ROCKER SW 15FT (MISCELLANEOUS) ×2 IMPLANT
ELECT REM PT RETURN 15FT ADLT (MISCELLANEOUS) ×2 IMPLANT
EVACUATOR 1/8 PVC DRAIN (DRAIN) IMPLANT
FACESHIELD WRAPAROUND (MASK) ×4 IMPLANT
GAUZE XEROFORM 1X8 LF (GAUZE/BANDAGES/DRESSINGS) ×4 IMPLANT
GLOVE BIO SURGEON STRL SZ7.5 (GLOVE) ×4 IMPLANT
GLOVE BIOGEL PI IND STRL 6.5 (GLOVE) ×4 IMPLANT
GLOVE BIOGEL PI IND STRL 7.0 (GLOVE) ×4 IMPLANT
GLOVE BIOGEL PI IND STRL 8 (GLOVE) ×4 IMPLANT
GLOVE BIOGEL PI INDICATOR 6.5 (GLOVE) ×4
GLOVE BIOGEL PI INDICATOR 7.0 (GLOVE) ×4
GLOVE BIOGEL PI INDICATOR 8 (GLOVE) ×4
GLOVE ECLIPSE 8.0 STRL XLNG CF (GLOVE) ×4 IMPLANT
GOWN STRL REUS W/TWL XL LVL3 (GOWN DISPOSABLE) ×12 IMPLANT
HANDPIECE INTERPULSE COAX TIP (DISPOSABLE) ×2
MARKER SKIN DUAL TIP RULER LAB (MISCELLANEOUS) ×2 IMPLANT
PACK ANTERIOR HIP CUSTOM (KITS) ×2 IMPLANT
PACK UNIVERSAL I (CUSTOM PROCEDURE TRAY) ×2 IMPLANT
PIN SECTOR W/GRIP ACE CUP 52MM (Hips) ×2 IMPLANT
RTRCTR WOUND ALEXIS 18CM MED (MISCELLANEOUS)
SET HNDPC FAN SPRY TIP SCT (DISPOSABLE) ×2 IMPLANT
SPONGE LAP 18X18 X RAY DECT (DISPOSABLE) ×6 IMPLANT
STAPLER VISISTAT 35W (STAPLE) ×4 IMPLANT
SUT ETHIBOND NAB CT1 #1 30IN (SUTURE) ×4 IMPLANT
SUT MNCRL AB 4-0 PS2 18 (SUTURE) IMPLANT
SUT VIC AB 0 CT1 36 (SUTURE) ×4 IMPLANT
SUT VIC AB 1 CT1 36 (SUTURE) ×4 IMPLANT
SUT VIC AB 2-0 CT1 27 (SUTURE) ×4
SUT VIC AB 2-0 CT1 TAPERPNT 27 (SUTURE) ×4 IMPLANT
TOWEL OR 17X26 10 PK STRL BLUE (TOWEL DISPOSABLE) ×2 IMPLANT
TRAY FOLEY CATH 14FRSI W/METER (CATHETERS) ×2 IMPLANT
YANKAUER SUCT BULB TIP 10FT TU (MISCELLANEOUS) ×4 IMPLANT

## 2017-11-06 NOTE — Anesthesia Procedure Notes (Signed)
Procedure Name: MAC Date/Time: 11/06/2017 10:21 AM Performed by: West Pugh, CRNA Pre-anesthesia Checklist: Patient identified, Emergency Drugs available, Suction available, Patient being monitored and Timeout performed Patient Re-evaluated:Patient Re-evaluated prior to induction Oxygen Delivery Method: Simple face mask Placement Confirmation: positive ETCO2 Dental Injury: Teeth and Oropharynx as per pre-operative assessment

## 2017-11-06 NOTE — Brief Op Note (Signed)
11/06/2017  12:52 PM  PATIENT:  Kaitlyn [name]  62 y.o. female  PRE-OPERATIVE DIAGNOSIS:  Avascular Necrosis Bilateral Hips  POST-OPERATIVE DIAGNOSIS:  Avascular Necrosis Bilateral Hips  PROCEDURE:  Procedure(s): BILATERAL TOTAL HIP ARTHROPLASTY ANTERIOR APPROACH (Bilateral)  SURGEON:  Surgeon(s) and Role:    Kathryne Hitch* Elohim Brune Y, MD - Primary  PHYSICIAN ASSISTANT: Rexene EdisonGil Clark, PA-C  ANESTHESIA:   spinal  EBL:  500 mL   COUNTS:  YES  DICTATION: .Other Dictation: Dictation Number [id number]  PLAN OF CARE: Admit to inpatient   PATIENT DISPOSITION:  PACU - hemodynamically stable.   Delay start of Pharmacological VTE agent (>24hrs) due to surgical blood loss or risk of bleeding: no

## 2017-11-06 NOTE — Transfer of Care (Signed)
Immediate Anesthesia Transfer of Care Note  Patient: Kaitlyn [name]  Procedure(s) Performed: BILATERAL TOTAL HIP ARTHROPLASTY ANTERIOR APPROACH (Bilateral Hip)  Patient Location: PACU  Anesthesia Type:MAC and Spinal  Level of Consciousness: alert , oriented and patient cooperative  Airway & Oxygen Therapy: Patient Spontanous Breathing and Patient connected to face mask oxygen  Post-op Assessment: Report given to RN and Post -op Vital signs reviewed and stable  Post vital signs: Reviewed and stable  Last Vitals:  Vitals:   11/06/17 0901  BP: (!) 163/99  Pulse: 93  Resp: 16  Temp: 36.9 C  SpO2: 98%    Last Pain:  Vitals:   11/06/17 0918  TempSrc:   PainSc: 8          Complications: No apparent anesthesia complications

## 2017-11-06 NOTE — Progress Notes (Signed)
Pt with low urinary output, only 170 since ~ 15:40. Pt receiving IV NS @ 775ml/hr. VSS. Notified on call physician for Dr. Magnus IvanBlackman. Received a call back from [facility] L[facility]sey Stanberry PA-C. Just monitor for now & continue IV fluids and recheck output in am.

## 2017-11-06 NOTE — Anesthesia Procedure Notes (Signed)
Spinal  Patient location during procedure: OR Start time: 11/06/2017 10:21 AM End time: 11/06/2017 10:29 AM Reason for block: at surgeon's request Staffing Resident/CRNA: West Pugh, CRNA Performed: resident/CRNA  Preanesthetic Checklist Completed: patient identified, site marked, surgical consent, pre-op evaluation, timeout performed, IV checked, risks and benefits discussed and monitors and equipment checked Spinal Block Patient position: sitting Prep: site prepped and draped and DuraPrep Patient monitoring: heart rate, continuous pulse ox, blood pressure and cardiac monitor Approach: right paramedian Location: L3-4 Injection technique: single-shot Needle Needle type: Pencan  Needle gauge: 24 G Needle length: 9 cm Assessment Sensory level: T4 Additional Notes Expiration of kit checked and confirmed. Patient tolerated procedure well,without complications  with noted clear CSF. Loss of motor and sensory on exam post injection.

## 2017-11-06 NOTE — H&P (Signed)
TOTAL HIP ADMISSION H&P  Patient is admitted for bilaterally total hip arthroplasty.  Subjective:  Chief Complaint: bilaterally hip pain  HPI: Kaitlyn [name], 62 y.o. female, has a history of pain and functional disability in the bilaterally hip(s) due to avascular necrosis and patient has failed non-surgical conservative treatments for greater than 12 weeks to include NSAID's and/or analgesics, flexibility and strengthening excercises, supervised PT with diminished ADL's post treatment, use of assistive devices, weight reduction as appropriate and activity modification.  Onset of symptoms was abrupt starting 1 years ago with rapidlly worsening course since that time.The patient noted no past surgery on the bilaterally hip(s).  Patient currently rates pain in the bilaterally hip at 10 out of 10 with activity. Patient has night pain, worsening of pain with activity and weight bearing, trendelenberg gait, pain that interfers with activities of daily living and pain with passive range of motion. Patient has evidence of subchondral cysts and joint space narrowing by imaging studies. This condition presents safety issues increasing the risk of falls.  There is no current active infection.  Patient Active Problem List   Diagnosis Date Noted  . Avascular necrosis of bones of both hips (HCC) 11/06/2017  . Bilateral hip pain 08/31/2017  . Avascular necrosis of hip, left (HCC) 08/31/2017  . Avascular necrosis of hip, right (HCC) 08/31/2017   Past Medical History:  Diagnosis Date  . Arthritis   . Depression   . Frequency of urination   . GERD (gastroesophageal reflux disease)    CONTROLLED [name]/ PRILOSEC  . Headache    hx of migraines  . Hyperlipemia   . Hypothyroidism   . Incontinence of urine   . Insomnia   . Interstitial cystitis   . Pelvic pain syndrome I.C.  . PONV (postoperative nausea and vomiting)   . Urgency of urination   . Wears dentures    UPPER  . Wears glasses     Past Surgical  History:  Procedure Laterality Date  . CYSTO WITH HYDRODISTENSION  09/18/2011   Procedure: CYSTOSCOPY/HYDRODISTENSION;  Surgeon: Kaitlyn [name];  Location: [facility];  Service: Urology;  Laterality: N/A;  Instillation of Marcaine & Pyridium  . CYSTO WITH HYDRODISTENSION N/A 05/31/2013   Procedure: CYSTOSCOPY/HYDRODISTENSION INSTALLATION OF MARCAINE/PYRIDIUM;  Surgeon: Kaitlyn HasteMatthew Ramsey Eskridge, [name];  [facility] [place];  Service: Urology;  Laterality: N/A;  . CYSTO WITH HYDRODISTENSION N/A 01/24/2014   Procedure: CYSTOSCOPY/HYDRODISTENSION INSTALLATION OF MARCAINE AND PYRIDIUM;  Surgeon: Kaitlyn CreteJohn J Wrenn, [name];  [facility] [place];  Service: Urology;  Laterality: N/A;  . CYSTO WITH HYDRODISTENSION N/A 08/03/2014   Procedure: CYSTO/HYDRODISTENSION OF BLADDER/INSTILL  PYRIDIUM/MARCAINE;  Surgeon: Kaitlyn CreteJohn J Wrenn, [name];  [facility] [place];  Service: Urology;  Laterality: N/A;  . CYSTO WITH HYDRODISTENSION N/A 11/08/2015   Procedure: CYSTOSCOPY/HYDRODISTENSION;  Surgeon: Kaitlyn PippinJohn Wrenn, [name];  [facility] [place];  Service: Urology;  Laterality: N/A;  . CYSTO WITH HYDRODISTENSION N/A 12/11/2016   Procedure: CYSTOSCOPY/HYDRODISTENSION;  Surgeon: Kaitlyn PippinJohn Wrenn, [name];  [facility] [place];  Service: Urology;  Laterality: N/A;  . CYSTO/ HOD/ INSTILLATION THERAPY  LAST ONE 08-27-2010   MULTIPLE TIMES  . JOINT REPLACEMENT     bilateral hip c. Kaitlyn [name] 11-06-17  . TRANSTHORACIC ECHOCARDIOGRAM  07-06-2012   mild LVH, ef 55-65%, grade 1 diastolic dysfunction/  mild AR/  trivial MR and TR  . VAGINAL HYSTERECTOMY  1980'S    Current Facility-Administered Medications  Medication Dose Route Frequency Provider Last Rate  Last Dose  . ceFAZolin (ANCEF) IVPB 2g/100 mL premix  2 g Intravenous On Call to OR Kaitlyn [name]      . chlorhexidine (HIBICLENS) 4 % liquid 4 application  60 mL Topical Once Kaitlyn Canal Kaitlyn [name]      .  tranexamic acid (CYKLOKAPRON) 1,000 mg in sodium chloride 0.9 % 100 mL IVPB  1,000 mg Intravenous To OR Kaitlyn [name], Kaitlyn [name]       Facility-Administered Medications Ordered in Other Encounters  Medication Dose Route Frequency Provider Last Rate Last Dose  . bupivacaine (MARCAINE) 0.5 % 15 mL, phenazopyridine (PYRIDIUM) 400 mg bladder mixture   Bladder Instillation Once Kaitlyn Pippin, [name]       Allergies  Allergen Reactions  . Aspirin Other (See Comments)    Gi upset  . Sulfa Antibiotics Rash  . Trilafon [Perphenazine] Rash    TRILA    Social History   Tobacco Use  . Smoking status: Former Smoker    Packs/day: 0.50    Years: 13.00    Pack years: 6.50    Types: Cigarettes    Last attempt to quit: 11/12/2016    Years since quitting: 0.9  . Smokeless tobacco: Never Used  Substance Use Topics  . Alcohol use: No    No family history on file.   Review of Systems  Musculoskeletal: Positive for joint pain.  All other systems reviewed and are negative.   Objective:  Physical Exam  Constitutional: She is oriented to person, place, and time. She appears well-developed and well-nourished.  HENT:  Head: Normocephalic and atraumatic.  Eyes: EOM are normal. Pupils are equal, round, and reactive to light.  Neck: Normal range of motion. Neck supple.  Cardiovascular: Normal rate and regular rhythm.  Respiratory: Effort normal and breath sounds normal.  GI: Soft. Bowel sounds are normal.  Musculoskeletal:       Right hip: She exhibits decreased range of motion, decreased strength, tenderness and bony tenderness.       Left hip: She exhibits decreased range of motion, decreased strength, tenderness and bony tenderness.  Neurological: She is alert and oriented to person, place, and time.  Skin: Skin is warm and dry.  Psychiatric: She has a normal mood and affect.    Vital signs in last 24 hours:    Labs:   Estimated body mass index is 33.65 kg/m as calculated from the  following:   Height as of 10/28/17: 4\' 10"  (1.473 m).   Weight as of 10/28/17: 161 lb (73 kg).   Imaging Review Plain radiographs demonstrate severe avascular necrosis of the bilateral hip(s). The bone quality appears to be good for age and reported activity level.  Assessment/Plan:  End AVN, bilaterally hip(s)  The patient history, physical examination, clinical judgement of the provider and imaging studies are consistent with end stage avascular necrosis of the bilaterally hip(s) and total hip arthroplasty is deemed medically necessary. The treatment options including medical management, injection therapy, arthroscopy and arthroplasty were discussed at length. The risks and benefits of total hip arthroplasty were presented and reviewed. The risks due to aseptic loosening, infection, stiffness, dislocation/subluxation,  thromboembolic complications and other imponderables were discussed.  The patient acknowledged the explanation, agreed to proceed with the plan and consent was signed. Patient is being admitted for inpatient treatment for surgery, pain control, PT, OT, prophylactic antibiotics, VTE prophylaxis, progressive ambulation and ADL's and discharge planning.The patient is planning to be discharged home with home health services

## 2017-11-06 NOTE — Anesthesia Preprocedure Evaluation (Addendum)
Anesthesia Evaluation  Patient identified by MRN, date of birth, ID band Patient awake    Reviewed: Allergy & Precautions, H&P , NPO status , Patient's Chart, lab work & pertinent test results  History of Anesthesia Complications (+) PONV  Airway Mallampati: III  TM Distance: >3 FB Neck ROM: Full    Dental no notable dental hx. (+) Upper Dentures, Dental Advisory Given   Pulmonary neg pulmonary ROS, former smoker,    Pulmonary exam normal breath sounds clear to auscultation       Cardiovascular negative cardio ROS   Rhythm:Regular Rate:Normal     Neuro/Psych  Headaches, Depression    GI/Hepatic Neg liver ROS, GERD  Medicated and Controlled,  Endo/Other  Hypothyroidism   Renal/GU negative Renal ROS  negative genitourinary   Musculoskeletal  (+) Arthritis , Osteoarthritis,    Abdominal   Peds  Hematology negative hematology ROS (+)   Anesthesia Other Findings   Reproductive/Obstetrics negative OB ROS                            Anesthesia Physical Anesthesia Plan  ASA: II  Anesthesia Plan: Spinal   Post-op Pain Management:    Induction: Intravenous  PONV Risk Score and Plan: 4 or greater and Ondansetron, Dexamethasone and Midazolam  Airway Management Planned: Simple Face Mask  Additional Equipment:   Intra-op Plan:   Post-operative Plan:   Informed Consent: I have reviewed the patients History and Physical, chart, labs and discussed the procedure including the risks, benefits and alternatives for the proposed anesthesia with the patient or authorized representative who has indicated his/her understanding and acceptance.   Dental advisory given  Plan Discussed with: CRNA  Anesthesia Plan Comments:         Anesthesia Quick Evaluation

## 2017-11-06 NOTE — Anesthesia Postprocedure Evaluation (Signed)
Anesthesia Post Note  Patient: Kaitlyn [name]  Procedure(s) Performed: BILATERAL TOTAL HIP ARTHROPLASTY ANTERIOR APPROACH (Bilateral Hip)     Patient location during evaluation: PACU Anesthesia Type: Spinal Level of consciousness: oriented and awake and alert Pain management: pain level controlled Vital Signs Assessment: post-procedure vital signs reviewed and stable Respiratory status: spontaneous breathing, respiratory function stable and patient connected to nasal cannula oxygen Cardiovascular status: blood pressure returned to baseline and stable Postop Assessment: no headache, no backache and no apparent nausea or vomiting Anesthetic complications: no    Last Vitals:  Vitals:   11/06/17 1457 11/06/17 1513  BP: 123/65 116/90  Pulse: (!) 109 (!) 115  Resp: 16 16  Temp: 36.4 C 36.5 C  SpO2:  98%    Last Pain:  Vitals:   11/06/17 1457  TempSrc:   PainSc: 4                  Teniya Filter,W. EDMOND

## 2017-11-07 LAB — CBC
HCT: 27.3 % — ABNORMAL LOW (ref 36.0–46.0)
Hemoglobin: 8.6 g/dL — ABNORMAL LOW (ref 12.0–15.0)
MCH: 30.3 pg (ref 26.0–34.0)
MCHC: 31.5 g/dL (ref 30.0–36.0)
MCV: 96.1 fL (ref 78.0–100.0)
PLATELETS: 286 10*3/uL (ref 150–400)
RBC: 2.84 MIL/uL — ABNORMAL LOW (ref 3.87–5.11)
RDW: 16.2 % — ABNORMAL HIGH (ref 11.5–15.5)
WBC: 11.2 10*3/uL — AB (ref 4.0–10.5)

## 2017-11-07 LAB — BASIC METABOLIC PANEL
Anion gap: 10 (ref 5–15)
BUN: 19 mg/dL (ref 6–20)
CO2: 21 mmol/L — ABNORMAL LOW (ref 22–32)
Calcium: 8.1 mg/dL — ABNORMAL LOW (ref 8.9–10.3)
Chloride: 107 mmol/L (ref 101–111)
Creatinine, Ser: 1.23 mg/dL — ABNORMAL HIGH (ref 0.44–1.00)
GFR calc Af Amer: 54 mL/min — ABNORMAL LOW (ref >60)
GFR, EST NON AFRICAN AMERICAN: 46 mL/min — AB (ref >60)
GLUCOSE: 105 mg/dL — AB (ref 65–99)
Potassium: 4.6 mmol/L (ref 3.5–5.1)
Sodium: 138 mmol/L (ref 135–145)

## 2017-11-07 MED ORDER — SODIUM CHLORIDE 0.9 % IV BOLUS (SEPSIS)
500.0000 mL | Freq: Once | INTRAVENOUS | Status: AC
  Administered 2017-11-07: 500 mL via INTRAVENOUS

## 2017-11-07 NOTE — Evaluation (Signed)
Physical Therapy Evaluation Patient Details Name: Kaitlyn [name] MRN: [id number] DOB: March 26, 1956 Today's Date: 11/07/2017   History of Present Illness  s/p bil THA secondary to AVN; PMHx: arthritis, depression  Clinical Impression  Pt is s/p THA resulting in the deficits listed below (see PT Problem List). Pt is limited by lethargy, incr HR, weakness. Will likely need SNF post acute unless she makes vast improvements over the next few days; will continue to follow and assess for needs. Discussed with surgeon  Pt will benefit from skilled PT to increase their independence and safety with mobility to allow discharge to the venue listed below.      Follow Up Recommendations SNF    Equipment Recommendations  Rolling walker with 5" wheels(petite)    Recommendations for Other Services       Precautions / Restrictions Precautions Precautions: Fall Restrictions Weight Bearing Restrictions: No Other Position/Activity Restrictions: WBAT      Mobility  Bed Mobility Overal bed mobility: Needs Assistance Bed Mobility: Supine to Sit     Supine to sit: Max assist;+2 for safety/equipment;+2 for physical assistance     General bed mobility comments: assist with LEs and trunk, pt is shaky, tremulous with uncoordinating movements of bil UEs  Transfers Overall transfer level: Needs assistance   Transfers: Sit to/from Stand Sit to Stand: Max assist;+2 physical assistance;+2 safety/equipment         General transfer comment: pt unable to wt bear thru LEs on attempt to stand, pt assisted with lateral scooting along EOB with +2 max assist; pt returned to supine d/t elevted HR and decreasing sats (HR max  137, Sats 90% on RA, pt appearing dyspneic)  Ambulation/Gait             General Gait Details: unable  Stairs            Wheelchair Mobility    Modified Rankin (Stroke Patients Only)       Balance Overall balance assessment: Needs assistance Sitting-balance support:  Feet supported;Bilateral upper extremity supported Sitting balance-Leahy Scale: Poor Sitting balance - Comments: unable to maintain static sit without support     Standing balance-Leahy Scale: Zero Standing balance comment: unable                             Pertinent Vitals/Pain Pain Assessment: 0-10 Pain Score: 5  Pain Location: bil hips Pain Descriptors / Indicators: Discomfort;Sore Pain Intervention(s): Limited activity within patient's tolerance;Monitored during session;Premedicated before session    Home Living Family/patient expects to be discharged to:: Private residence Living Arrangements: Spouse/significant other Available Help at Discharge: Family Type of Home: House Home Access: Stairs to enter Entrance Stairs-Rails: Right Entrance Stairs-Number of Steps: 4 Home Layout: One level Home Equipment: Environmental consultant - 4 wheels Additional Comments: husband is a Naval architect, travels M-F, plans to be off to assist pt for ~3wks    Prior Function Level of Independence: Independent with assistive device(s)   Gait / Transfers Assistance Needed: amb with rollator, sleeping on sofa recently instead of bed  ADL's / Homemaking Assistance Needed: husband reports Independence        Hand Dominance        Extremity/Trunk Assessment   Upper Extremity Assessment Upper Extremity Assessment: Generalized weakness    Lower Extremity Assessment Lower Extremity Assessment: RLE deficits/detail;LLE deficits/detail RLE Deficits / Details: ankle grossly WFL, knee and hip limited testing d/t pain and lethargy; AROM to 90hip/90 knee  RLE: Unable  to fully assess due to pain LLE Deficits / Details: ankle grossly WFL, knee and hip limited strength testing d/t pain and lethargy; AROM to 90hip/90 knee        Communication   Communication: No difficulties;Expressive difficulties(difficulty answering questions,. difficult to understand at times)  Cognition Arousal/Alertness:  Awake/alert(sleepy) Behavior During Therapy: WFL for tasks assessed/performed;Flat affect Overall Cognitive Status: Impaired/Different from baseline Area of Impairment: Following commands;Safety/judgement                       Following Commands: Follows one step commands with increased time;Follows multi-step commands inconsistently Safety/Judgement: Decreased awareness of safety;Decreased awareness of deficits     General Comments: speech difficult to understand      General Comments      Exercises Total Joint Exercises Ankle Circles/Pumps: AROM;Both;10 reps   Assessment/Plan    PT Assessment Patient needs continued PT services  PT Problem List Decreased strength;Decreased mobility;Decreased activity tolerance;Decreased balance;Decreased cognition;Decreased coordination;Decreased safety awareness;Pain       PT Treatment Interventions DME instruction;Therapeutic activities;Gait training;Stair training;Functional mobility training;Balance training;Therapeutic exercise;Patient/family education    PT Goals (Current goals can be found in the Care Plan section)  Acute Rehab PT Goals Patient Stated Goal: home but rehab if needed PT Goal Formulation: With patient/family Time For Goal Achievement: 11/14/17 Potential to Achieve Goals: Good    Frequency 7X/week   Barriers to discharge        Co-evaluation               AM-PAC PT "6 Clicks" Daily Activity  Outcome Measure Difficulty turning over in bed (including adjusting bedclothes, sheets and blankets)?: Unable Difficulty moving from lying on back to sitting on the side of the bed? : Unable Difficulty sitting down on and standing up from a chair with arms (e.g., wheelchair, bedside commode, etc,.)?: Unable Help needed moving to and from a bed to chair (including a wheelchair)?: Total Help needed walking in hospital room?: Total Help needed climbing 3-5 steps with a railing? : Total 6 Click Score: 6     End of Session Equipment Utilized During Treatment: Gait belt Activity Tolerance: Patient limited by fatigue;Patient limited by lethargy;Treatment limited secondary to medical complications (Comment) Patient left: in bed;with call bell/phone within reach;with family/visitor present;with bed alarm set Nurse Communication: Mobility status PT Visit Diagnosis: Unsteadiness on feet (R26.81);Difficulty in walking, not elsewhere classified (R26.2);Muscle weakness (generalized) (M62.81)    Time: 1033-1100 PT Time Calculation (min) (ACUTE ONLY): 27 min   Charges:   PT Evaluation $PT Eval Moderate Complexity: 1 Mod PT Treatments $Therapeutic Activity: 8-22 mins   PT G CodesDrucilla Chalet:        Granvel Proudfoot, PT Pager: [phone] 11/07/2017   [facility] LtdWILLIAMS,Hena Ewalt 11/07/2017, 11:44 AM

## 2017-11-07 NOTE — Progress Notes (Signed)
Subjective: 1 Day Post-Op Procedure(s) (LRB): BILATERAL TOTAL HIP ARTHROPLASTY ANTERIOR APPROACH (Bilateral) Patient reports pain as moderate.  Acute blood loss anemia attributable to her extensive surgery and dilution from fluids. Tachycardia when sitting up.  Objective: Vital signs in last 24 hours: Temp:  [97.6 F (36.4 C)-98.4 F (36.9 C)] 97.7 F (36.5 C) (02/23 1005) Pulse Rate:  [97-127] 107 (02/23 1005) Resp:  [14-20] 18 (02/23 1005) BP: (95-157)/(65-94) 118/65 (02/23 1005) SpO2:  [93 %-100 %] 98 % (02/23 1005) FiO2 (%):  [2 %] 2 % (02/23 1005)  Intake/Output from previous day: 02/22 0701 - 02/23 0700 In: 4126.3 [P.O.:360; I.V.:3611.3; IV Piggyback:155] Out: 970 [Urine:470; Blood:500] Intake/Output this shift: No intake/output data recorded.  Recent Labs    11/07/17 0611  HGB 8.6*   Recent Labs    11/07/17 0611  WBC 11.2*  RBC 2.84*  HCT 27.3*  PLT 286   Recent Labs    11/07/17 0611  NA 138  K 4.6  CL 107  CO2 21*  BUN 19  CREATININE 1.23*  GLUCOSE 105*  CALCIUM 8.1*   No results for input(s): LABPT, INR in the last 72 hours.  Sensation intact distally Intact pulses distally Dorsiflexion/Plantar flexion intact Incision: dressing C/D/I  Assessment/Plan: 1 Day Post-Op Procedure(s) (LRB): BILATERAL TOTAL HIP ARTHROPLASTY ANTERIOR APPROACH (Bilateral) Up with therapy as she tolerates Will likely need a transfusion during this hospitalization due to her anemia and it likely becoming symptomatic for her.  Kathryne HitchChristopher Y Blackman 11/07/2017, 11:06 AM

## 2017-11-07 NOTE — Progress Notes (Signed)
OT Cancellation Note  Patient Details Name: Kaitlyn [name] MRN: [id number] DOB: 05-Jan-1956   Cancelled Treatment:    Reason Eval/Treat Not Completed: Other (comment) - Patient requiring max A +2 with basic mobility at this time per PT and not yet ready for OT instruction on ADLs. Will follow up for OT evaluation tomorrow.  Virgen Belland A Uniqua Kihn 11/07/2017, 11:57 AM

## 2017-11-07 NOTE — Progress Notes (Signed)
   11/07/17 1700  PT Visit Information  Last PT Received On 11/07/17;  Pt  Continues to be  Lethargic; pt with involuntary muscle twitching all 4 extremeties; RN present; pt is oriented to self, place, situation but has much difficulty maintaining arousal; will continue to follow  Assistance Needed +2  History of Present Illness s/p bil THA secondary to AVN; PMHx: arthritis, depression  Subjective Data  Patient Stated Goal home but rehab if needed  Precautions  Precautions Fall  Pain Assessment  Pain Assessment 0-10  Pain Score 5  Pain Location bil hips  Pain Descriptors / Indicators Discomfort;Grimacing  Pain Intervention(s) Monitored during session;Premedicated before session;Repositioned  Cognition  Arousal/Alertness Awake/alert  Behavior During Therapy WFL for tasks assessed/performed;Flat affect  Overall Cognitive Status Impaired/Different from baseline  Area of Impairment Following commands;Safety/judgement  Following Commands Follows one step commands with increased time;Follows multi-step commands inconsistently  Safety/Judgement Decreased awareness of safety;Decreased awareness of deficits  Total Joint Exercises  Ankle Circles/Pumps AROM;Both;10 reps  Quad Sets AROM;Both;10 reps  Heel Slides AAROM;Both;10 reps  Hip ABduction/ADduction AAROM;Both;10 reps  Short Arc Quad AAROM;AROM;Both;10 reps  PT - End of Session  Activity Tolerance Patient limited by fatigue;Patient limited by lethargy;Patient limited by pain  Patient left in bed;with call bell/phone within reach;with family/visitor present;with bed alarm set;with nursing/sitter in room  PT - Assessment/Plan  PT Plan Current plan remains appropriate  PT Visit Diagnosis Unsteadiness on feet (R26.81);Difficulty in walking, not elsewhere classified (R26.2);Muscle weakness (generalized) (M62.81)  PT Frequency (ACUTE ONLY) 7X/week  Follow Up Recommendations SNF  PT equipment Rolling walker with 5" wheels  AM-PAC PT "6  Clicks" Daily Activity Outcome Measure  Difficulty turning over in bed (including adjusting bedclothes, sheets and blankets)? 1  Difficulty moving from lying on back to sitting on the side of the bed?  1  Difficulty sitting down on and standing up from a chair with arms (e.g., wheelchair, bedside commode, etc,.)? 1  Help needed moving to and from a bed to chair (including a wheelchair)? 1  Help needed walking in hospital room? 1  Help needed climbing 3-5 steps with a railing?  1  6 Click Score 6  Mobility G Code  CN  Acute Rehab PT Goals  PT Goal Formulation With patient/family  Time For Goal Achievement 11/14/17  Potential to Achieve Goals Good  PT Time Calculation  PT Start Time (ACUTE ONLY) 1505  PT Stop Time (ACUTE ONLY) 1516  PT Time Calculation (min) (ACUTE ONLY) 11 min  PT General Charges  $$ ACUTE PT VISIT 1 Visit  PT Treatments  $Therapeutic Exercise 8-22 mins

## 2017-11-08 LAB — CBC
HCT: 23.5 % — ABNORMAL LOW (ref 36.0–46.0)
Hemoglobin: 7.3 g/dL — ABNORMAL LOW (ref 12.0–15.0)
MCH: 29.1 pg (ref 26.0–34.0)
MCHC: 31.1 g/dL (ref 30.0–36.0)
MCV: 93.6 fL (ref 78.0–100.0)
PLATELETS: 233 10*3/uL (ref 150–400)
RBC: 2.51 MIL/uL — AB (ref 3.87–5.11)
RDW: 16.3 % — ABNORMAL HIGH (ref 11.5–15.5)
WBC: 9.3 10*3/uL (ref 4.0–10.5)

## 2017-11-08 LAB — PREPARE RBC (CROSSMATCH)

## 2017-11-08 MED ORDER — SODIUM CHLORIDE 0.9 % IV SOLN
Freq: Once | INTRAVENOUS | Status: AC
  Administered 2017-11-08: 12:00:00 via INTRAVENOUS

## 2017-11-08 MED ORDER — FUROSEMIDE 10 MG/ML IJ SOLN
20.0000 mg | Freq: Once | INTRAMUSCULAR | Status: AC
  Administered 2017-11-08: 15:00:00 20 mg via INTRAVENOUS
  Filled 2017-11-08: qty 2

## 2017-11-08 NOTE — Progress Notes (Signed)
Patient's foley catheter was removed at approximately 1400 yesterday. Patient with no urge to void and bladder scans done times 2 showed 49cc and 173cc. Dr Magnus IvanBlackman informed and normal saline bolus of 500 ordered. Same was give at 2358 last night. Patient still with no urge to void but will bladder scan later this morning. Will continue normal saline at 75cc. Hemoglobin was 8.6 yesterday. Patient to be seen on rounds this morning and will possible receive a blood transfusion. Patient still with twitching to upper extremities and has difficulty holding items in her hands.

## 2017-11-08 NOTE — Care Management Note (Addendum)
Case Management Note  Patient Details  Name: Kaitlyn [name] MRN: [id number] Date of Birth: 12/19/1955  Subjective/Objective:    S/p bilateral Hip                Action/Plan: [facility] pt [facility] Pt's husband states SNF is requested. CSW referral for SNF. Pt has a Rollator at home.   Expected Discharge Date:  11/09/17               Expected Discharge Plan:  Skilled Nursing Facility  In-House Referral:  Clinical Social Work  Discharge planning Services  CM Consult  Post Acute Care Choice:  NA Choice offered to:  NA  DME Arranged:  N/A DME Agency:  NA  HH Arranged:  NA HH Agency:  NA  Status of Service:  Completed, signed off  If discussed at [employer] Length of Stay Meetings, dates discussed:    Additional Comments:  Elliot CousinShavis, Stephan Nelis Ellen, RN 11/08/2017, 3:56 PM

## 2017-11-08 NOTE — Progress Notes (Signed)
Patient ID: Kaitlyn [name], female   DOB: May 18, 1956, 62 y.o.   MRN: [id number] Given the patient's low hgb/hct combined with her low urine output, will transfuse 2 units of PRBCs today.

## 2017-11-08 NOTE — Progress Notes (Signed)
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; PMHx: arthritis, depression    PT Comments    Pt cognition improving however she is still requiring max/total assist for bed mobility and is unable to stand or WB through LEs; pain is an issue since narcotics have been minimized  d/t pt lethargy and diminished mental status; planned  to laterally scoot pt into chair however deferred OOB d/t risk of fall upon return to bed with nursing staff and thought maximove would be too painful at this time;  will continue to follow in acute setting, recommend STSNF-- possibly try CIR screen but pt will need to show some  progress prior to this  Follow Up Recommendations  SNF     Equipment Recommendations  Rolling walker with 5" wheels    Recommendations for Other Services       Precautions / Restrictions Precautions Precautions: Fall Restrictions Weight Bearing Restrictions: No Other Position/Activity Restrictions: WBAT    Mobility  Bed Mobility Overal bed mobility: Needs Assistance Bed Mobility: Supine to Sit;Sit to Supine     Supine to sit: Max assist;Total assist;HOB elevated Sit to supine: +2 for physical assistance;+2 for safety/equipment;Total assist   General bed mobility comments: pt requires max assist to bring LEs off bed and to elevate trunk; +2 assist  to return to supine needing assist with trunk and bil LEs onto bed  Transfers Overall transfer level: Needs assistance               General transfer comment: attmepted sit to stand,pt unable to wt bear thru LEs, unable to extend hips and knees to support any of her on body wt; pt assisted with lateral scooting along EOB with +1 max/total  assist; pt returned to supine d/t elevted HR and decreasing sats (HR max  117, Sats 94% on RA, pt mildly dyspneic)  Ambulation/Gait              General Gait Details: unable   Stairs            Wheelchair Mobility    Modified Rankin (Stroke Patients Only)       Balance   Sitting-balance support: Single extremity supported;Feet supported Sitting balance-Leahy Scale: Fair Sitting balance - Comments: pt is able to maintain static sit on EOB with close supervision, pt is unable to wt shift out of BOS; she is still shaky with uncoordinated UE movements     Standing balance-Leahy Scale: Zero Standing balance comment: unable                            Cognition Arousal/Alertness: Awake/alert Behavior During Therapy: WFL for tasks assessed/performed Overall Cognitive Status: Impaired/Different from baseline                         Following Commands: Follows one step commands with increased time;Follows multi-step commands inconsistently       General Comments: pt with improved eye contact,  verbalizing more but continues to require incr time to repsond, states she is at [facility]; oreiented to self, situation and later corrects to [facility];      Exercises Total Joint Exercises Ankle Circles/Pumps: AROM;Both;10 reps Quad Sets: AROM;Both;10 reps Heel Slides: AAROM;Both;10 reps Hip ABduction/ADduction: AAROM;Both;10 reps Long Arc Quad: AROM;Both;5 reps;Seated    General Comments  Pertinent Vitals/Pain Pain Assessment: Faces Faces Pain Scale: Hurts whole lot Pain Location: bil hips Pain Descriptors / Indicators: Grimacing;Guarding;Jabbing Pain Intervention(s): Limited activity within patient's tolerance;Monitored during session;Repositioned    Home Living                      Prior Function            PT Goals (current goals can now be found in the care plan section) Acute Rehab PT Goals Patient Stated Goal: home but rehab if needed PT Goal Formulation: With patient/family Time For Goal Achievement: 11/14/17 Potential to Achieve Goals: Good Progress towards  PT goals: Not progressing toward goals - comment(d/t pain/weakness)    Frequency    7X/week      PT Plan Current plan remains appropriate    Co-evaluation              AM-PAC PT "6 Clicks" Daily Activity  Outcome Measure  Difficulty turning over in bed (including adjusting bedclothes, sheets and blankets)?: Unable Difficulty moving from lying on back to sitting on the side of the bed? : Unable Difficulty sitting down on and standing up from a chair with arms (e.g., wheelchair, bedside commode, etc,.)?: Unable Help needed moving to and from a bed to chair (including a wheelchair)?: Total Help needed walking in hospital room?: Total Help needed climbing 3-5 steps with a railing? : Total 6 Click Score: 6    End of Session Equipment Utilized During Treatment: Gait belt Activity Tolerance: Patient limited by fatigue;Patient limited by pain Patient left: in bed;with call bell/phone within reach;with family/visitor present;with bed alarm set;with nursing/sitter in room   PT Visit Diagnosis: Unsteadiness on feet (R26.81);Difficulty in walking, not elsewhere classified (R26.2);Muscle weakness (generalized) (M62.81)     Time: [id number] PT Time Calculation (min) (ACUTE ONLY): 38 min  Charges:  $Therapeutic Exercise: 8-22 mins $Therapeutic Activity: 23-37 mins                    G CodesDrucilla Chalet:       Jaclynne Baldo, PT Pager: [phone] 11/08/2017    [facility] HospitalWILLIAMS,Sakoya Win 11/08/2017, 4:18 PM

## 2017-11-08 NOTE — Progress Notes (Signed)
PT Cancellation Note  Patient Details Name: Kaitlyn [name] MRN: [id number] DOB: 1955-10-25   Cancelled Treatment:    Reason Eval/Treat Not Completed: Medical issues which prohibited therapy(Hgb 7.3 and just starting 1st unit, check again later)   [facility] AreaWILLIAMS,Etana Beets 11/08/2017, 12:33 PM

## 2017-11-08 NOTE — Progress Notes (Signed)
OT Cancellation Note  Patient Details Name: Kaitlyn AsaDebra K [name] MRN: [id number] DOB: [birth year]   Cancelled Treatment:    Reason Eval/Treat Not Completed: Patient not medically ready  Medical issues which prohibited therapy(Hgb 7.3 and just starting 1st unit, check again later)    Jeanie SewerREDDING, Dorena BodoLorraine D  Lori Brigitt Mcclish, OT [phone] 11/08/2017, 12:34 PM

## 2017-11-09 LAB — CBC
HEMATOCRIT: 33.2 % — AB (ref 36.0–46.0)
HEMOGLOBIN: 11.1 g/dL — AB (ref 12.0–15.0)
MCH: 30.7 pg (ref 26.0–34.0)
MCHC: 33.4 g/dL (ref 30.0–36.0)
MCV: 92 fL (ref 78.0–100.0)
Platelets: 245 10*3/uL (ref 150–400)
RBC: 3.61 MIL/uL — AB (ref 3.87–5.11)
RDW: 15.9 % — ABNORMAL HIGH (ref 11.5–15.5)
WBC: 10.2 10*3/uL (ref 4.0–10.5)

## 2017-11-09 LAB — BPAM RBC
BLOOD PRODUCT EXPIRATION DATE: 201903272359
Blood Product Expiration Date: 201903272359
ISSUE DATE / TIME: 201902241132
ISSUE DATE / TIME: 201902241530
UNIT TYPE AND RH: 5100
Unit Type and Rh: 5100

## 2017-11-09 LAB — BASIC METABOLIC PANEL
Anion gap: 11 (ref 5–15)
BUN: 13 mg/dL (ref 6–20)
CHLORIDE: 104 mmol/L (ref 101–111)
CO2: 24 mmol/L (ref 22–32)
Calcium: 8.5 mg/dL — ABNORMAL LOW (ref 8.9–10.3)
Creatinine, Ser: 0.88 mg/dL (ref 0.44–1.00)
GFR calc Af Amer: >60 mL/min (ref >60)
GLUCOSE: 80 mg/dL (ref 65–99)
POTASSIUM: 3.6 mmol/L (ref 3.5–5.1)
Sodium: 139 mmol/L (ref 135–145)

## 2017-11-09 LAB — TYPE AND SCREEN
ABO/RH(D): O POS
Antibody Screen: NEGATIVE
Unit division: 0
Unit division: 0

## 2017-11-09 NOTE — NC FL2 (Signed)
[place] MEDICAID FL2 LEVEL OF CARE SCREENING TOOL     IDENTIFICATION  Patient Name: Kaitlyn [name] Birthdate: October 04, 1955 Sex: female Admission Date (Current Location): 11/06/2017  [facility] and [place] Number:  Producer, television/film/video and Address:  [facility],  [address], [place] [address]      Provider Number: [id number]  Attending Physician Name and Address:  Kathryne Hitch  Relative Name and Phone Number:       Current Level of Care: SNF Recommended Level of Care: Skilled Nursing Facility Prior Approval Number:    Date Approved/Denied:   PASRR Number:    Discharge Plan: SNF    Current Diagnoses: Patient Active Problem List   Diagnosis Date Noted  . Avascular necrosis of bones of both hips (HCC) 11/06/2017  . Status post bilateral total hip replacement 11/06/2017  . Bilateral hip pain 08/31/2017  . Avascular necrosis of hip, left (HCC) 08/31/2017  . Avascular necrosis of hip, right (HCC) 08/31/2017    Orientation RESPIRATION BLADDER Height & Weight     Self, Time, Situation, Place  Normal Continent Weight: 163 lb (73.9 kg) Height:  4\' 10"  (147.3 cm)  BEHAVIORAL SYMPTOMS/MOOD NEUROLOGICAL BOWEL NUTRITION STATUS      Continent Diet(Regular )  AMBULATORY STATUS COMMUNICATION OF NEEDS Skin   Extensive Assist Non-Verbally Normal(Incisison-Hip)                       Personal Care Assistance Level of Assistance  Bathing, Feeding, Dressing Bathing Assistance: Limited assistance Feeding assistance: Independent Dressing Assistance: Limited assistance     Functional Limitations Info  Sight, Hearing, Speech Sight Info: Impaired Hearing Info: Adequate Speech Info: Adequate    SPECIAL CARE FACTORS FREQUENCY  PT (By licensed PT), OT (By licensed OT)     PT Frequency: 7X/WEEK OT Frequency: 7X/WEEK            Contractures Contractures Info: Not present    Additional Factors Info  Code Status, Allergies, Psychotropic Code  Status Info: FULLCODE Allergies Info: Allergies: Aspirin, Sulfa Antibiotics, Trilafon Perphenazine Psychotropic Info: Lexapro, Seroquel          Current Medications (11/09/2017):  This is the current hospital active medication list Current Facility-Administered Medications  Medication Dose Route Frequency Provider Last Rate Last Dose  . 0.9 %  sodium chloride infusion   Intravenous Continuous Kathryne Hitch, MD   Stopped at 11/08/17 1825  . acetaminophen (TYLENOL) tablet 650 mg  650 mg Oral Q4H PRN Kathryne Hitch, MD   650 mg at 11/07/17 0510   Or  . acetaminophen (TYLENOL) suppository 650 mg  650 mg Rectal Q4H PRN Kathryne Hitch, MD      . alum & mag hydroxide-simeth (MAALOX/MYLANTA) 200-200-20 MG/5ML suspension 30 mL  30 mL Oral Q4H PRN Kathryne Hitch, MD      . diphenhydrAMINE (BENADRYL) 12.5 MG/5ML elixir 12.5-25 mg  12.5-25 mg Oral Q4H PRN Kathryne Hitch, MD      . divalproex (DEPAKOTE) DR tablet 500 mg  500 mg Oral QHS Kathryne Hitch, MD   500 mg at 11/08/17 2134  . docusate sodium (COLACE) capsule 100 mg  100 mg Oral BID Kathryne Hitch, MD   100 mg at 11/09/17 0945  . escitalopram (LEXAPRO) tablet 20 mg  20 mg Oral QHS Kathryne Hitch, MD   20 mg at 11/08/17 2133  . gabapentin (NEURONTIN) capsule 300 mg  300 mg Oral BID Doneen Poisson  Y, MD   300 mg at 11/08/17 2133  . HYDROcodone-acetaminophen (NORCO/VICODIN) 5-325 MG per tablet 1-2 tablet  1-2 tablet Oral Q4H PRN Kathryne HitchBlackman, Christopher Y, MD   2 tablet at 11/09/17 0945  . HYDROmorphone (DILAUDID) injection 1 mg  1 mg Intravenous Q2H PRN Kathryne HitchBlackman, Christopher Y, MD   1 mg at 11/06/17 1611  . levothyroxine (SYNTHROID, LEVOTHROID) tablet 88 mcg  88 mcg Oral QHS Kathryne HitchBlackman, Christopher Y, MD   88 mcg at 11/08/17 2133  . menthol-cetylpyridinium (CEPACOL) lozenge 3 mg  1 lozenge Oral PRN Kathryne HitchBlackman, Christopher Y, MD       Or  . phenol (CHLORASEPTIC) mouth spray 1  spray  1 spray Mouth/Throat PRN Kathryne HitchBlackman, Christopher Y, MD      . methocarbamol (ROBAXIN) tablet 500 mg  500 mg Oral Q6H PRN Kathryne HitchBlackman, Christopher Y, MD   500 mg at 11/09/17 [id number]   Or  . methocarbamol (ROBAXIN) 500 mg in dextrose 5 % 50 mL IVPB  500 mg Intravenous Q6H PRN Kathryne HitchBlackman, Christopher Y, MD   Stopped at 11/06/17 1340  . metoCLOPramide (REGLAN) tablet 5-10 mg  5-10 mg Oral Q8H PRN Kathryne HitchBlackman, Christopher Y, MD       Or  . metoCLOPramide (REGLAN) injection 5-10 mg  5-10 mg Intravenous Q8H PRN Kathryne HitchBlackman, Christopher Y, MD      . ondansetron [facility](ZOFRAN) tablet 4 mg  4 mg Oral Q6H PRN Kathryne HitchBlackman, Christopher Y, MD       Or  . ondansetron [facility](ZOFRAN) injection 4 mg  4 mg Intravenous Q6H PRN Kathryne HitchBlackman, Christopher Y, MD      . oxyCODONE (Oxy IR/ROXICODONE) immediate release tablet 10 mg  10 mg Oral Q3H PRN Kathryne HitchBlackman, Christopher Y, MD   10 mg at 11/08/17 1830  . pantoprazole (PROTONIX) EC tablet 40 mg  40 mg Oral Daily Kathryne HitchBlackman, Christopher Y, MD   40 mg at 11/09/17 0945  . polyethylene glycol (MIRALAX / GLYCOLAX) packet 17 g  17 g Oral Daily PRN Kathryne HitchBlackman, Christopher Y, MD      . QUEtiapine (SEROQUEL) tablet 200 mg  200 mg Oral QHS Kathryne HitchBlackman, Christopher Y, MD   200 mg at 11/08/17 2133  . rivaroxaban (XARELTO) tablet 10 mg  10 mg Oral Q breakfast Kathryne HitchBlackman, Christopher Y, MD   10 mg at 11/09/17 0831   Facility-Administered Medications Ordered in Other Encounters  Medication Dose Route Frequency Provider Last Rate Last Dose  . bupivacaine (MARCAINE) 0.5 % 15 mL, phenazopyridine (PYRIDIUM) 400 mg bladder mixture   Bladder Instillation Once Bjorn PippinWrenn, John, MD         Discharge Medications: Please see discharge summary for a list of discharge medications.  Relevant Imaging Results:  Relevant Lab Results:   Additional Information SSN:[id number]  Clearance CootsNicole A Emersen Mascari, LCSW

## 2017-11-09 NOTE — Progress Notes (Signed)
   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; PMHx: arthritis, depression  Subjective Data  Patient Stated Goal home but rehab if needed  Precautions  Precautions Fall  Restrictions  Weight Bearing Restrictions No  Other Position/Activity Restrictions WBAT  Pain Assessment  Pain Location bil hips R>L  Pain Descriptors / Indicators Grimacing;Guarding;Discomfort;Operative site guarding  Cognition  Arousal/Alertness Awake/alert  Behavior During Therapy WFL for tasks assessed/performed;Flat affect  Overall Cognitive Status Within Functional Limits for tasks assessed  Area of Impairment Following commands;Safety/judgement  Following Commands Follows one step commands consistently;Follows one step commands with increased time  General Comments requires incr time to respond, more verbal today than over weekend  Bed Mobility  Overal bed mobility Needs Assistance  Bed Mobility Supine to Sit  Supine to sit Mod assist;Max assist  General bed mobility comments assisted to EOB by OT  Transfers  Overall transfer level Needs assistance  Equipment used Rolling walker (2 wheeled)  Transfers Sit to/from Stand  Sit to Stand Min assist;Mod assist;+2 physical assistance;+2 safety/equipment  General transfer comment incr time, multi-modal cues for hand placement, trunk and hip extension  Ambulation/Gait  Ambulation/Gait assistance Min assist;+2 physical assistance;+2 safety/equipment  Assistive device Rolling walker (2 wheeled)  Gait Pattern/deviations Step-to pattern;Decreased step length - right;Decreased step length - left;Wide base of support  General Gait Details very slow gait, multi-modal cues for wt shift, RW position,  sequence and step length  Balance  Sitting  balance-Leahy Scale Fair  Standing balance support Bilateral upper extremity supported  Standing balance-Leahy Scale Poor  Standing balance comment reliant on UEs and external support  PT - End of Session  Equipment Utilized During Treatment Gait belt  Activity Tolerance Patient limited by fatigue;Patient limited by pain  Patient left in bed;with call bell/phone within reach;with family/visitor present;with bed alarm set;with nursing/sitter in room  Nurse Communication Mobility status  PT - Assessment/Plan  PT Plan Current plan remains appropriate  PT Visit Diagnosis Unsteadiness on feet (R26.81);Difficulty in walking, not elsewhere classified (R26.2);Muscle weakness (generalized) (M62.81)  PT Frequency (ACUTE ONLY) 7X/week  Follow Up Recommendations SNF  PT equipment Rolling walker with 5" wheels  AM-PAC PT "6 Clicks" Daily Activity Outcome Measure  Difficulty turning over in bed (including adjusting bedclothes, sheets and blankets)? 1  Difficulty moving from lying on back to sitting on the side of the bed?  1  Difficulty sitting down on and standing up from a chair with arms (e.g., wheelchair, bedside commode, etc,.)? 1  Help needed moving to and from a bed to chair (including a wheelchair)? 1  Help needed walking in hospital room? 1  Help needed climbing 3-5 steps with a railing?  1  6 Click Score 6  Mobility G Code  CN  PT Goal Progression  Progress towards PT goals Progressing toward goals  PT Time Calculation  PT Start Time (ACUTE ONLY) 1027  PT Stop Time (ACUTE ONLY) 1045  PT Time Calculation (min) (ACUTE ONLY) 18 min  PT General Charges  $$ ACUTE PT VISIT 1 Visit  PT Treatments  $Gait Training 8-22 mins

## 2017-11-09 NOTE — Progress Notes (Signed)
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  Very slow moving.  Attempted amb however only able to complete 2 feet due to increased c/o pain (R>L) B hips.  Assisted back to bed to perform some THR TE's followed by ICE.  Pt progressing slowly and will need St Rehab at SNF prior to returning home.   Follow Up Recommendations  SNF     Equipment Recommendations  Rolling walker with 5" wheels    Recommendations for Other Services       Precautions / Restrictions Precautions Precautions: Fall Restrictions Weight Bearing Restrictions: No Other Position/Activity Restrictions: WBAT    Mobility  Bed Mobility Overal bed mobility: Needs Assistance Bed Mobility: Sit to Supine     Supine to sit: Mod assist;Max assist Sit to supine: Max assist   General bed mobility comments: Max Assist B LE at same time back to bed  Transfers Overall transfer level: Needs assistance   Transfers: Sit to/from Stand Sit to Stand: Mod assist;+2 physical assistance;+2 safety/equipment Stand pivot transfers: Mod assist;Min assist       General transfer comment: increased assist to rise from lower recliner level.    Ambulation/Gait Ambulation/Gait assistance: Min assist Ambulation Distance (Feet): 2 Feet Assistive device: Rolling walker (2 wheeled) Gait Pattern/deviations: Step-to pattern;Step-through pattern;Decreased step length - right;Decreased step length - left Gait velocity: decreased x 3   General Gait Details: very limited distance due to increased pain this afternoon and fatigue.  Very small short steps with difficulty advancing either LE.     Stairs            Wheelchair Mobility    Modified Rankin (Stroke Patients  Only)       Balance   Sitting-balance support: Feet supported;Bilateral upper extremity supported Sitting balance-Leahy Scale: Good     Standing balance support: Bilateral upper extremity supported Standing balance-Leahy Scale: Fair                              Cognition Arousal/Alertness: Awake/alert Behavior During Therapy: WFL for tasks assessed/performed;Flat affect Overall Cognitive Status: Within Functional Limits for tasks assessed Area of Impairment: Following commands;Safety/judgement                       Following Commands: Follows one step commands consistently;Follows one step commands with increased time Safety/Judgement: Decreased awareness of safety;Decreased awareness of deficits     General Comments: increased time to respond, slightly slow      Exercises   Total Hip Replacement TE's 10 reps ankle pumps 10 reps knee presses 10 reps heel slides AAROM 10 reps SAQ's  Followed by ICE     General Comments        Pertinent Vitals/Pain Pain Assessment: Faces Pain Score: 6  Faces Pain Scale: Hurts little more Pain Location: bil hips R>L Pain Descriptors / Indicators: Grimacing;Guarding;Discomfort;Operative site guarding Pain Intervention(s): Monitored during session;Repositioned;Ice applied    Home Living Family/patient expects to be discharged to:: Private residence Living Arrangements: Spouse/significant other Available Help at Discharge: Family Type of Home: House Home Access: Stairs to enter Entrance Stairs-Rails: Right Home Layout: One level Home Equipment: Environmental consultantWalker - 4 wheels  Additional Comments: husband is a Naval architect, travels M-F, plans to be off to assist pt for ~3wks    Prior Function Level of Independence: Independent with assistive device(s)  Gait / Transfers Assistance Needed: amb with rollator, sleeping on sofa recently instead of bed ADL's / Homemaking Assistance Needed: husband reports Independence      PT Goals (current goals can now be found in the care plan section) Acute Rehab PT Goals Patient Stated Goal: home but rehab if needed Progress towards PT goals: Progressing toward goals    Frequency    7X/week      PT Plan Current plan remains appropriate    Co-evaluation              AM-PAC PT "6 Clicks" Daily Activity  Outcome Measure  Difficulty turning over in bed (including adjusting bedclothes, sheets and blankets)?: Unable Difficulty moving from lying on back to sitting on the side of the bed? : Unable Difficulty sitting down on and standing up from a chair with arms (e.g., wheelchair, bedside commode, etc,.)?: Unable Help needed moving to and from a bed to chair (including a wheelchair)?: Total Help needed walking in hospital room?: Total Help needed climbing 3-5 steps with a railing? : Total 6 Click Score: 6    End of Session Equipment Utilized During Treatment: Gait belt Activity Tolerance: Patient limited by fatigue;Patient limited by pain Patient left: in bed;with call bell/phone within reach;with family/visitor present;with bed alarm set;with nursing/sitter in room Nurse Communication: Mobility status PT Visit Diagnosis: Unsteadiness on feet (R26.81);Difficulty in walking, not elsewhere classified (R26.2);Muscle weakness (generalized) (M62.81)     Time: 1610-9604 PT Time Calculation (min) (ACUTE ONLY): 24 min  Charges:  $Gait Training: 8-22 mins $Therapeutic Exercise: 8-22 mins                    G Codes:       Felecia Shelling  PTA [facility]  Acute  Rehab Pager      [phone]

## 2017-11-09 NOTE — Evaluation (Signed)
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  Pt will benefit from skilled OT to increase their safety and independence with ADL and functional mobility for ADL to facilitate discharge to venue listed below.        Follow Up Recommendations  SNF    Equipment Recommendations  3 in 1 bedside commode       Precautions / Restrictions Precautions Precautions: Fall Restrictions Weight Bearing Restrictions: No Other Position/Activity Restrictions: WBAT      Mobility Bed Mobility Overal bed mobility: Needs Assistance Bed Mobility: Supine to Sit     Supine to sit: Mod assist;Max assist        Transfers Overall transfer level: Needs assistance   Transfers: Sit to/from Stand;Stand Pivot Transfers Sit to Stand: Mod assist;Min assist Stand pivot transfers: Mod assist;Min assist       General transfer comment: pt initially mod A    Balance   Sitting-balance support: Feet supported;Bilateral upper extremity supported Sitting balance-Leahy Scale: Good     Standing balance support: Bilateral upper extremity supported Standing balance-Leahy Scale: Fair                             ADL either performed or assessed with clinical judgement   ADL Overall ADL's : Needs assistance/impaired Eating/Feeding: Set up;Sitting   Grooming: Set up;Sitting   Upper Body Bathing: Minimal assistance;Sitting   Lower Body Bathing: Maximal assistance;Sit to/from stand;Cueing for safety;Cueing for sequencing;Cueing for compensatory techniques   Upper Body Dressing : Minimal assistance;Sitting   Lower Body Dressing: Maximal assistance;Sit to/from stand;Cueing for safety;Cueing for sequencing;Cueing for  compensatory techniques   Toilet Transfer: RW;Moderate assistance Toilet Transfer Details (indicate cue type and reason): bed to chair Toileting- Clothing Manipulation and Hygiene: Maximal assistance;Sit to/from stand;Cueing for safety;Cueing for sequencing               Vision Patient Visual Report: No change from baseline       Perception     Praxis      Pertinent Vitals/Pain Pain Assessment: Faces Pain Score: 6  Pain Location: bil hips Pain Descriptors / Indicators: Grimacing;Guarding;Discomfort Pain Intervention(s): Limited activity within patient's tolerance;Monitored during session;Premedicated before session     Hand Dominance     Extremity/Trunk Assessment Upper Extremity Assessment Upper Extremity Assessment: Generalized weakness           Communication Communication Communication: No difficulties   Cognition Arousal/Alertness: Awake/alert Behavior During Therapy: WFL for tasks assessed/performed;Flat affect Overall Cognitive Status: Within Functional Limits for tasks assessed                         Following Commands: Follows one step commands consistently;Follows one step commands with increased time       General Comments: pt with increased comunication this day .   General Comments               Home Living Family/patient expects to be discharged to:: Private residence Living Arrangements: Spouse/significant other Available Help at Discharge: Family Type of Home: House Home Access: Stairs to enter [employer] of Steps: 4 Entrance Stairs-Rails: Right Home Layout: One level  Home Equipment: Walker - 4 wheels   Additional Comments: husband is a Naval architecttruck driver, travels M-F, plans to be off to assist pt for ~3wks      Prior Functioning/Environment Level of Independence: Independent with assistive device(s)  Gait / Transfers Assistance Needed: amb with rollator, sleeping on sofa recently instead  of bed ADL's / Homemaking Assistance Needed: husband reports Independence            OT Problem List: Decreased strength;Decreased activity tolerance;Decreased knowledge of use of DME or AE;Pain;Decreased safety awareness;Impaired balance (sitting and/or standing)      OT Treatment/Interventions: Self-care/ADL training;DME and/or AE instruction;Patient/family education    OT Goals(Current goals can be found in the care plan section) Acute Rehab OT Goals Patient Stated Goal: home but rehab if needed OT Goal Formulation: With patient Time For Goal Achievement: 11/23/17 Potential to Achieve Goals: Good  OT Frequency: Min 2X/week   Barriers to D/C:               AM-PAC PT "6 Clicks" Daily Activity     Outcome Measure Help from another person eating meals?: None Help from another person taking care of personal grooming?: A Little Help from another person toileting, which includes using toliet, bedpan, or urinal?: A Lot Help from another person bathing (including washing, rinsing, drying)?: A Lot Help from another person to put on and taking off regular upper body clothing?: A Little Help from another person to put on and taking off regular lower body clothing?: Total 6 Click Score: 15   End of Session Equipment Utilized During Treatment: Rolling walker Nurse Communication: Mobility status  Activity Tolerance: Patient tolerated treatment well Patient left: in chair;with call bell/phone within reach;with family/visitor present  OT Visit Diagnosis: Unsteadiness on feet (R26.81);Muscle weakness (generalized) (M62.81);Pain;Other abnormalities of gait and mobility (R26.89) Pain - part of body: Hip                Time: 1020-1040 OT Time Calculation (min): 20 min Charges:  OT General Charges $OT Visit: 1 Visit OT Evaluation $OT Eval Moderate Complexity: 1 Mod G-Codes:     Lise AuerLori Ceasia [place], [place] [phone]  Einar CrowEDDING, Kaitlyn [name] D 11/09/2017, 11:28 AM

## 2017-11-09 NOTE — Op Note (Signed)
NAME:  Kaitlyn [name], Kaitlyn [name]                       ACCOUNT NO.:  MEDICAL RECORD NO.:  [id number]  LOCATION:                                 FACILITY:  PHYSICIAN:  Vanita Panda. Magnus Ivan, M.D.DATE OF BIRTH:  DATE OF PROCEDURE:  11/06/2017 DATE OF DISCHARGE:                              OPERATIVE REPORT   PREOPERATIVE DIAGNOSIS:  Bilateral hip idiopathic avascular necrosis.  POSTOPERATIVE DIAGNOSIS:  Bilateral hip idiopathic avascular necrosis.  PROCEDURE:  Bilateral total hip arthroplasties through direct anterior approach.  IMPLANTS:  Same implants on both sides with DePuy Sector Gription acetabular component size 54, size 36 +0 polyethylene liner, size 11 Corail femoral component with varus offset, size 36 +5 ceramic hip ball again with both implants the same bilaterally.  SURGEON:  Vanita Panda. Magnus Ivan, M.D.  ASSISTANT:  Richardean Canal, PA-C.  ANESTHESIA:  Spinal.  ANTIBIOTICS:  2 g of IV Ancef.  BLOOD LOSS:  500 cc.  COMPLICATIONS:  None.  INDICATIONS:  Kaitlyn [name] is a 62 year old female with idiopathic avascular necrosis involving both her hips.  Both of them are same in terms of pain and the same in terms of radiographic findings showing significant cartilage changes in the bilateral femoral heads with femoral head subchondral edema and cystic changes.  Both hips have large effusions as well.  At this point, Kaitlyn [name] does wish to proceed with bilateral hip replacements.  Kaitlyn [name] is 62 years old and otherwise healthy and with her body habitus, I felt comfortable with going and proceeding with both hips.  We had a long and thorough discussion about the heightened risk of acute blood loss anemia, nerve and vessel injury, fracture, infection, dislocation, DVT, with all these being heightened with being bilateral surgery.  Kaitlyn [name] understands our goals are to decrease pain, improve mobility, and overall improved quality of life.  PROCEDURE DESCRIPTION:  After informed consent was  obtained, appropriate right and left hips were marked.  Kaitlyn [name] was brought to the operating room, where spinal anesthesia was obtained while Kaitlyn [name] was on her stretcher.  A Foley catheter was placed and both feet had traction boots applied to them.  Of note, her leg lengths preoperative were equal.  Kaitlyn [name] was then placed supine on the Hana fracture table with the perineal post in place and both legs in inline skeletal traction devices, but no traction applied.  Her right operative hip was able to be started with first, was prepped and draped with DuraPrep and sterile drapes.  Time-out was called and Kaitlyn [name] was identified as correct patient and correct right hip. We then made an incision just inferior and posterior to the anterior superior iliac spine and carried this obliquely down the leg.  We dissected down the tensor fascia lata muscle.  The tensor fascia was then divided longitudinally to proceed with a direct anterior approach to the hip.  We identified and cauterized the circumflex vessels and then identified the hip capsule.  I opened up the hip capsule in an L- type format, finding a very large joint effusion and finding flaking cartilage around the femoral head.  We placed Cobra retractors around the medial and lateral femoral neck and  then made our femoral neck cut with an oscillating saw and completed this with an osteotome.  I placed a corkscrew guide in the femoral head and removed the femoral head in its entirety.  We then cleaned the acetabulum and remnants of the acetabular labrum.  We then began reaming from a size 43 reamer in stepwise increments all the way up to size 54 because of needing to widen her acetabulum and medialize it because every time I was bring out reamers, we were not getting much cartilage at all.  We then placed the real DePuy Sector Gription acetabular component size 54 and a 36 +0 polyethylene liner for that size acetabular component.  Attention was then  turned to the femur.  With the leg externally rotated to 120 degrees, extended and adducted, we were able to place a Mueller retractor medially and a Hohmann retractor behind the greater trochanter.  We released the lateral joint capsule and used a box cutting osteotome to enter the femoral canal and a rongeur to lateralize.  We then began broaching from a size 8 broach using the Corail broaching system going up to a size 11.  Based off our anatomy, we trialed a varus offset femoral neck and a 36 +1.5 hip ball, and reduced in the acetabulum, and I felt like we needed just a little bit more leg length and offset, although did feels stable.  We then dislocated the hip and removed the trial components.  We were able to place the real Corail femoral component with varus offset size 11 and the real 36 +5 ceramic hip ball and reduced this in the acetabulum, and again I was appreciated stability.  We then irrigated the soft tissue with normal saline solution using pulsatile lavage.  We verified again the placement under direct fluoroscopy and visualization as well.  We then closed the joint capsule with interrupted #1 Ethibond suture followed by running #1 Vicryl in the tensor fascia, 0 Vicryl in the deep tissue, 2-0 Vicryl in the subcutaneous tissue, interrupted staples on the skin.  Xeroform and Aquacel dressing were applied.  We took the dressing down and spoke with Anesthesia and stated that we were doing well and Kaitlyn [name] was stable, and then we could proceed with her left side. We then went around to the left side and kept the sterile field stable. We re-prepped and draped the left side and re-gown and gloves.  We then got our fluoroscopy lined up as well so we could proceed with a direct anterior approach on the left side.  After that, left hip was prepped and draped with DuraPrep and sterile drapes.  We did do another time-out to identify as correct patient and correct now left hip having down  the right hip.  We then made our incision just inferior and posterior to the anterior superior iliac spine and carried this obliquely down the leg and in stepwise, went through the same process as her opposite side.  We exposed the femoral head and neck, and then made our femoral neck cut with an oscillating saw proximal to lesser trochanter and completed this with an osteotome, and removed the femoral head and found it.  Also have large joint effusion with significant cartilage changes in the femoral head consistent with avascular necrosis.  We then removed remnants of the acetabular labrum and other debris and went through her stay in stepwise increments on the acetabular side and the femur side to place a 54 acetabular component and an 11  femoral component with varus offset, and a 36 +1.5 hip ball and this gave us stability and measured our leg length and offset to be same as other side.  We then irrigated the soft tissue on the left side and closed the joint capsule with interrupted #1 Ethibond suture followed by running #1 Vicryl in the tensor fascia, 0 Vicryl in the deep tissue, 2-0 Vicryl in the subcutaneous tissue, interrupted staples on the skin.  Xeroform and well-padded sterile dressing was applied with an Aquacel on that side.  Kaitlyn [name] was then taken off the Hana table and taken to the recovery room in stable condition. All final counts were correct.  There were no complications noted.  Of note, Richardean CanalGilbert Clark, PA-C assisted in the entire case.  His assistance was crucial for facilitating all aspects of this case.    Vanita Pandahristopher Y. Magnus IvanBlackman, M.D.    CYB/MEDQ  D:  11/06/2017  T:  11/06/2017  Job:  [id number]

## 2017-11-09 NOTE — Clinical Social Work Note (Addendum)
Clinical Social Work Assessment  Patient Details  Name: Kaitlyn [name] MRN: [id number] Date of Birth: 12-Dec-1955  Date of referral:  11/09/17               Reason for consult:  Discharge Planning                Permission sought to share information with:  Facility Art therapist granted to share information::  Yes, Verbal Permission Granted  Name::        Agency::  SNF  Relationship::  Spouse  Contact Information:     Housing/Transportation Living arrangements for the past 2 months:  Single Family Home Source of Information:  Patient, Spouse Patient Interpreter Needed:  None Criminal Activity/Legal Involvement Pertinent to Current Situation/Hospitalization:  No - Comment as needed Significant Relationships:  Spouse Lives with:  Spouse Do you feel safe going back to the place where you live?  Yes Need for family participation in patient care:  Yes (Temporarily dependent w/mobility)  Care giving concerns:   Patient is admitted for bilaterally total hip arthroplasty. She was evaluated by physical therapy, PT recommends SNF rehab. Patient progressing slowly.     Social Worker assessment / plan:  CSW met with patient and spouse at bedside to discuss discharge. Patient reports she usually independent and currently employed. Patient was admitted for planned surgery and plans to follow up with short rehab before returning home.   CSW explain SNF process and later providing list of bed offers. Patient and spouse prefer a SNF close to their home in [place].   CSW explain [facility] process and facility starting the authorization. Patient and spouse report understanding.   Plan: SNF  Employment status:  [employer] information:  Other (Comment Required)(BCBS) PT Recommendations:    Information / Referral to community resources:  [place]  Patient/Family's Response to care:  Agreeable and Responding well to care.   Patient/Family's  Understanding of and Emotional Response to Diagnosis, Current Treatment, and Prognosis:  Patient knowledgeable of diagnosis and current treatment plan.   Emotional Assessment Appearance:  Developmentally appropriate Attitude/Demeanor/Rapport:    Affect (typically observed):  Accepting Orientation:  Oriented to Self, Oriented to  Time, Oriented to Place, Oriented to Situation Alcohol / Substance use:  Not Applicable Psych involvement (Current and /or in the community):  No (Comment)  Discharge Needs  Concerns to be addressed:  Decision making concerns Readmission within the last 30 days:  No Current discharge risk:  Dependent with Mobility Barriers to Discharge:  Continued Medical Work up, [place], LCSW 11/09/2017, 8:36 PM

## 2017-11-09 NOTE — Progress Notes (Signed)
Subjective: 3 Days Post-Op Procedure(s) (LRB): BILATERAL TOTAL HIP ARTHROPLASTY ANTERIOR APPROACH (Bilateral) Patient reports pain as moderate.  Looks better and feels better overall today.  Did receive a transfusion yesterday for acute blood loss anemia.  Has had very slow mobility.  SNF has been recommended.  Labs not back yet this am.  Objective: Vital signs in last 24 hours: Temp:  [98.2 F (36.8 C)-99.8 F (37.7 C)] 98.2 F (36.8 C) (02/25 0516) Pulse Rate:  [108-122] 108 (02/25 0516) Resp:  [15-20] 15 (02/25 0516) BP: (129-151)/(78-97) 136/97 (02/25 0516) SpO2:  [92 %-97 %] 96 % (02/25 0516)  Intake/Output from previous day: 02/24 0701 - 02/25 0700 In: 1288 [P.O.:480; I.V.:200; Blood:608] Out: 2200 [Urine:2200] Intake/Output this shift: No intake/output data recorded.  Recent Labs    11/07/17 0611 11/08/17 0542  HGB 8.6* 7.3*   Recent Labs    11/07/17 0611 11/08/17 0542  WBC 11.2* 9.3  RBC 2.84* 2.51*  HCT 27.3* 23.5*  PLT 286 233   Recent Labs    11/07/17 0611  NA 138  K 4.6  CL 107  CO2 21*  BUN 19  CREATININE 1.23*  GLUCOSE 105*  CALCIUM 8.1*   No results for input(s): LABPT, INR in the last 72 hours.  Sensation intact distally Intact pulses distally Dorsiflexion/Plantar flexion intact Incision: scant drainage  Assessment/Plan: 3 Days Post-Op Procedure(s) (LRB): BILATERAL TOTAL HIP ARTHROPLASTY ANTERIOR APPROACH (Bilateral) Up with therapy Plan for discharge tomorrow Discharge to SNF  Kathryne HitchChristopher Y Yuchen Fedor 11/09/2017, 7:05 AM

## 2017-11-10 MED ORDER — METHOCARBAMOL 500 MG PO TABS: 500.0000 mg | ORAL_TABLET | Freq: Four times a day (QID) | ORAL | 0 refills | Status: DC | PRN

## 2017-11-10 MED ORDER — RIVAROXABAN 10 MG PO TABS: 10.0000 mg | ORAL_TABLET | Freq: Every day | ORAL | 0 refills | Status: DC

## 2017-11-10 MED ORDER — OXYCODONE HCL 5 MG PO TABS: 5.0000 mg | ORAL_TABLET | ORAL | 0 refills | Status: DC | PRN

## 2017-11-10 NOTE — Discharge Summary (Signed)
Patient ID: Kaitlyn [name] MRN: [id number] DOB/AGE: 1955/09/25 62 y.o.  Admit date: 11/06/2017 Discharge date: 11/10/2017  Admission Diagnoses:  Principal Problem:   Avascular necrosis of bones of both hips [facility]) Active Problems:   Status post bilateral total hip replacement   Discharge Diagnoses:  Same  Past Medical History:  Diagnosis Date  . Arthritis   . Depression   . Frequency of urination   . GERD (gastroesophageal reflux disease)    CONTROLLED W/ PRILOSEC  . Headache    hx of migraines  . Hyperlipemia   . Hypothyroidism   . Incontinence of urine   . Insomnia   . Interstitial cystitis   . Pelvic pain syndrome I.C.  . PONV (postoperative nausea and vomiting)   . Urgency of urination   . Wears dentures    UPPER  . Wears glasses     Surgeries: Procedure(s): BILATERAL TOTAL HIP ARTHROPLASTY ANTERIOR APPROACH on 11/06/2017   Consultants:   Discharged Condition: Improved  Hospital Course: Kaitlyn [name] is an 62 y.o. female who was admitted 11/06/2017 for operative treatment ofAvascular necrosis of bones of both hips (HCC). Patient has severe unremitting pain that affects sleep, daily activities, and work/hobbies. After pre-op clearance the patient was taken to the operating room on 11/06/2017 and underwent  Procedure(s): BILATERAL TOTAL HIP ARTHROPLASTY ANTERIOR APPROACH.    Patient was given perioperative antibiotics:  Anti-infectives (From admission, onward)   Start     Dose/Rate Route Frequency Ordered Stop   11/06/17 1700  ceFAZolin (ANCEF) IVPB 1 g/50 mL premix     1 g 100 mL/hr over 30 Minutes Intravenous Every 6 hours 11/06/17 1520 11/06/17 2310   11/06/17 0858  ceFAZolin (ANCEF) 2-4 GM/100ML-% IVPB    Comments:  Andreas Blower   : cabinet override      11/06/17 0858 11/06/17 1029   11/06/17 0857  ceFAZolin (ANCEF) IVPB 2g/100 mL premix     2 g 200 mL/hr over 30 Minutes Intravenous On call to O.R. 11/06/17 0857 11/06/17 1059       Patient was given  sequential compression devices, early ambulation, and chemoprophylaxis to prevent DVT.  Patient benefited maximally from hospital stay and there were no complications.    Recent vital signs:  Patient Vitals for the past 24 hrs:  BP Temp Temp src Pulse Resp SpO2  11/10/17 0520 (!) 114/99 99.4 F (37.4 C) Oral (!) 103 16 91 %  11/09/17 2035 (!) 143/91 98.8 F (37.1 C) Oral (!) 104 16 96 %  11/09/17 1432 (!) 148/87 99.5 F (37.5 C) Oral (!) 103 16 94 %     Recent laboratory studies:  Recent Labs    11/08/17 0542 11/09/17 0559  WBC 9.3 10.2  HGB 7.3* 11.1*  HCT 23.5* 33.2*  PLT 233 245  NA  --  139  K  --  3.6  CL  --  104  CO2  --  24  BUN  --  13  CREATININE  --  0.88  GLUCOSE  --  80  CALCIUM  --  8.5*     Discharge Medications:   Allergies as of 11/10/2017      Reactions   Aspirin Other (See Comments)   Gi upset   Sulfa Antibiotics Rash   Trilafon [perphenazine] Rash   TRILA      Medication List    STOP taking these medications   BC FAST PAIN RELIEF 650-195-33.3 MG Pack Generic drug:  Aspirin-Salicylamide-Caffeine   HYDROcodone-acetaminophen 5-325  MG tablet Commonly known as:  NORCO     TAKE these medications   divalproex 500 MG DR tablet Commonly known as:  DEPAKOTE Take 500 mg by mouth at bedtime.   escitalopram 20 MG tablet Commonly known as:  LEXAPRO Take 20 mg by mouth at bedtime.   gabapentin 300 MG capsule Commonly known as:  NEURONTIN Take 300 mg by mouth 2 (two) times daily. In the afternoon and at bedtime.   levothyroxine 88 MCG tablet Commonly known as:  SYNTHROID, LEVOTHROID Take 88 mcg by mouth at bedtime.   Melatonin 10 MG Tabs Take 10 mg by mouth at bedtime.   methocarbamol 500 MG tablet Commonly known as:  ROBAXIN Take 1 tablet (500 mg total) by mouth every 6 (six) hours as needed for muscle spasms.   omeprazole 20 MG capsule Commonly known as:  PRILOSEC Take 20 mg by mouth daily.   oxyCODONE 5 MG immediate release  tablet Commonly known as:  ROXICODONE Take 1-2 tablets (5-10 mg total) by mouth every 4 (four) hours as needed.   QUEtiapine 200 MG tablet Commonly known as:  SEROQUEL Take 200 mg by mouth at bedtime.   rivaroxaban 10 MG Tabs tablet Commonly known as:  XARELTO Take 1 tablet (10 mg total) by mouth daily with breakfast.   rizatriptan 10 MG tablet Commonly known as:  MAXALT Take 10 mg by mouth 2 (two) times daily as needed. For migraine headaches.            Durable Medical Equipment  (From admission, onward)        Start     Ordered   11/06/17 1521  DME 3 n 1  Once     11/06/17 1520   11/06/17 1521  DME Walker rolling  Once    Question:  Patient needs a walker to treat with the following condition  Answer:  Status post bilateral total hip replacement   11/06/17 1520      Diagnostic Studies: Dg Pelvis Portable  Result Date: 11/06/2017 CLINICAL DATA:  Bilateral total hip replacement. EXAM: PORTABLE PELVIS 1-2 VIEWS COMPARISON:  08/31/2017 FINDINGS: Two-view show bilateral total hip replacement. Components appear well positioned. No radiographically detectable complication. IMPRESSION: Good appearance following bilateral total hip replacement. Electronically Signed   By: Paulina FusiMark  Shogry M.D.   On: 11/06/2017 13:41   Dg C-arm 1-60 Min-no Report  Result Date: 11/06/2017 Fluoroscopy was utilized by the requesting physician.  No radiographic interpretation.   Dg Hip Operative Unilat W Or W/o Pelvis Left  Result Date: 11/06/2017 CLINICAL DATA:  Status post bilateral total hip arthroplasties. EXAM: OPERATIVE bilateral HIP (WITH PELVIS IF PERFORMED) 4 VIEWS TECHNIQUE: Fluoroscopic spot image(s) were submitted for interpretation post-operatively. Total radiation exposure index: 7.32 mGy. COMPARISON:  Radiographs of August 31, 2017. FINDINGS: Four intraoperative fluoroscopic images were obtained of the pelvis and both hips. These demonstrate the femoral and acetabular components  bilaterally be in grossly good position. No fracture or dislocation is noted. Expected postoperative changes are seen around both hips. IMPRESSION: Status post bilateral total hip arthroplasties. Electronically Signed   By: Lupita RaiderJames  Green Jr, M.D.   On: 11/06/2017 13:00   Dg Hip Operative Unilat W Or W/o Pelvis Right  Result Date: 11/06/2017 CLINICAL DATA:  Status post bilateral total hip arthroplasties. EXAM: OPERATIVE bilateral HIP (WITH PELVIS IF PERFORMED) 4 VIEWS TECHNIQUE: Fluoroscopic spot image(s) were submitted for interpretation post-operatively. Total radiation exposure index: 7.32 mGy. COMPARISON:  Radiographs of August 31, 2017. FINDINGS: Four intraoperative  fluoroscopic images were obtained of the pelvis and both hips. These demonstrate the femoral and acetabular components bilaterally be in grossly good position. No fracture or dislocation is noted. Expected postoperative changes are seen around both hips. IMPRESSION: Status post bilateral total hip arthroplasties. Electronically Signed   By: Lupita Raider, M.D.   On: 11/06/2017 13:00    Disposition: to skilled nursing facility  Discharge Instructions    Discharge patient   Complete by:  As directed    Discharge disposition:  03-Skilled Nursing Facility   Discharge patient date:  11/10/2017       Contact information for follow-up providers    Kathryne Hitch, MD Follow up in 2 week(s).   Specialty:  Orthopedic Surgery Contact information: [address] [place] [address] [phone]            Contact information for after-discharge care    Destination    [facility] Follow up.   Service:  Skilled Nursing Contact information: [address] [place] [address] [phone]                   Signed: Kathryne Hitch 11/10/2017, 7:25 AM

## 2017-11-10 NOTE — Progress Notes (Signed)
RN called [facility] to [facility]. All questions answered.   Paperwork and prescriptions given.   [facility] transported patient to SNF with all belongings.

## 2017-11-10 NOTE — Progress Notes (Signed)
All morning meds given together per patient request. Patient pending discharge, and wanted meds given this AM.

## 2017-11-10 NOTE — Progress Notes (Signed)
Patient ID: Kaitlyn [name], female   DOB: June 09, 1956, 62 y.o.   MRN: [id number] Doing well overall.  Can go to skilled nursing today.  Bilateral hips stable.

## 2017-11-10 NOTE — Clinical Social Work Placement (Signed)
Insurance authorization pending.   CLINICAL SOCIAL WORK PLACEMENT  NOTE  Date:  11/10/2017  Patient Details  Name: Evelena AsaDebra K Killings MRN: [id number] Date of Birth: [birth year]  Clinical Social Work is seeking post-discharge placement for this patient at the Skilled  Nursing Facility level of care (*CSW will initial, date and re-position this form in  chart as items are completed):  Yes   Patient/family provided with [facility] Clinical Social Work Department's list of facilities offering this level of care within the geographic area requested by the patient (or if unable, by the patient's family).  Yes   Patient/family informed of their freedom to choose among providers that offer the needed level of care, that participate in Medicare, Medicaid or managed care program needed by the patient, have an available bed and are willing to accept the patient.  Yes   Patient/family informed of [facility]'s ownership interest in [facility] and [facility], as well as of the fact that they are under no obligation to receive care at these facilities.  PASRR submitted to EDS on 11/09/17     PASRR number received on       Existing PASRR number confirmed on       FL2 transmitted to all facilities in geographic area requested by pt/family on       FL2 transmitted to all facilities within larger geographic area on 11/09/17     Patient informed that his/her managed care company has contracts with or will negotiate with certain facilities, including the following:            Patient/family informed of bed offers received.  Patient chooses bed at [facility]     Physician recommends and patient chooses bed at      Patient to be transferred to [facility] on 11/10/17.  Patient to be transferred to facility by [facility]      Patient family notified on 11/10/17 of transfer.  Name of family member notified:  Spouse     PHYSICIAN       Additional Comment:     _______________________________________________ Clearance CootsNicole A Prakriti Carignan, LCSW 11/10/2017, 11:03 AM

## 2017-11-10 NOTE — Discharge Instructions (Signed)
Information on my medicine - XARELTO® (Rivaroxaban) ° °This medication education was reviewed with me or my healthcare representative as part of my discharge preparation.   °Why was Xarelto® prescribed for you? °Xarelto® was prescribed for you to reduce the risk of blood clots forming after orthopedic surgery. The medical term for these abnormal blood clots is venous thromboembolism (VTE). ° °What do you need to know about xarelto® ? °Take your Xarelto® ONCE DAILY at the same time every day. °You may take it either with or without food. ° °If you have difficulty swallowing the tablet whole, you may crush it and mix in applesauce just prior to taking your dose. ° °Take Xarelto® exactly as prescribed by your doctor and DO NOT stop taking Xarelto® without talking to the doctor who prescribed the medication.  Stopping without other VTE prevention medication to take the place of Xarelto® may increase your risk of developing a clot. ° °After discharge, you should have regular check-up appointments with your healthcare provider that is prescribing your Xarelto®.   ° °What do you do if you miss a dose? °If you miss a dose, take it as soon as you remember on the same day then continue your regularly scheduled once daily regimen the next day. Do not take two doses of Xarelto® on the same day.  ° °Important Safety Information °A possible side effect of Xarelto® is bleeding. You should call your healthcare provider right away if you experience any of the following: °? Bleeding from an injury or your nose that does not stop. °? Unusual colored urine (red or dark brown) or unusual colored stools (red or black). °? Unusual bruising for unknown reasons. °? A serious fall or if you hit your head (even if there is no bleeding). ° °Some medicines may interact with Xarelto® and might increase your risk of bleeding while on Xarelto®. To help avoid this, consult your healthcare provider or pharmacist prior to using any new prescription  or non-prescription medications, including herbals, vitamins, non-steroidal anti-inflammatory drugs (NSAIDs) and supplements. ° °This website has more information on Xarelto®: www.xarelto.com. ° °INSTRUCTIONS AFTER JOINT REPLACEMENT  ° °o Remove items at home which could result in a fall. This includes throw rugs or furniture in walking pathways °o ICE to the affected joint every three hours while awake for 30 minutes at a time, for at least the first 3-5 days, and then as needed for pain and swelling.  Continue to use ice for pain and swelling. You may notice swelling that will progress down to the foot and ankle.  This is normal after surgery.  Elevate your leg when you are not up walking on it.   °o Continue to use the breathing machine you got in the hospital (incentive spirometer) which will help keep your temperature down.  It is common for your temperature to cycle up and down following surgery, especially at night when you are not up moving around and exerting yourself.  The breathing machine keeps your lungs expanded and your temperature down. ° ° °DIET:  As you were doing prior to hospitalization, we recommend a well-balanced diet. ° °DRESSING / WOUND CARE / SHOWERING ° °Keep the surgical dressing until follow up.  The dressing is water proof, so you can shower without any extra covering.  IF THE DRESSING FALLS OFF or the wound gets wet inside, change the dressing with sterile gauze.  Please use good hand washing techniques before changing the dressing.  Do not use any lotions   or creams on the incision until instructed by your surgeon.   ° °ACTIVITY ° °o Increase activity slowly as tolerated, but follow the weight bearing instructions below.   °o No driving for 6 weeks or until further direction given by your physician.  You cannot drive while taking narcotics.  °o No lifting or carrying greater than 10 lbs. until further directed by your surgeon. °o Avoid periods of inactivity such as sitting longer than  an hour when not asleep. This helps prevent blood clots.  °o You may return to work once you are authorized by your doctor.  ° ° ° °WEIGHT BEARING  ° °Weight bearing as tolerated with assist device (walker, cane, etc) as directed, use it as long as suggested by your surgeon or therapist, typically at least 4-6 weeks. ° ° °EXERCISES ° °Results after joint replacement surgery are often greatly improved when you follow the exercise, range of motion and muscle strengthening exercises prescribed by your doctor. Safety measures are also important to protect the joint from further injury. Any time any of these exercises cause you to have increased pain or swelling, decrease what you are doing until you are comfortable again and then slowly increase them. If you have problems or questions, call your caregiver or physical therapist for advice.  ° °Rehabilitation is important following a joint replacement. After just a few days of immobilization, the muscles of the leg can become weakened and shrink (atrophy).  These exercises are designed to build up the tone and strength of the thigh and leg muscles and to improve motion. Often times heat used for twenty to thirty minutes before working out will loosen up your tissues and help with improving the range of motion but do not use heat for the first two weeks following surgery (sometimes heat can increase post-operative swelling).  ° °These exercises can be done on a training (exercise) mat, on the floor, on a table or on a bed. Use whatever works the best and is most comfortable for you.    Use music or television while you are exercising so that the exercises are a pleasant break in your day. This will make your life better with the exercises acting as a break in your routine that you can look forward to.   Perform all exercises about fifteen times, three times per day or as directed.  You should exercise both the operative leg and the other leg as well. ° °Exercises  include: °  °• Quad Sets - Tighten up the muscle on the front of the thigh (Quad) and hold for 5-10 seconds.   °• Straight Leg Raises - With your knee straight (if you were given a brace, keep it on), lift the leg to 60 degrees, hold for 3 seconds, and slowly lower the leg.  Perform this exercise against resistance later as your leg gets stronger.  °• Leg Slides: Lying on your back, slowly slide your foot toward your buttocks, bending your knee up off the floor (only go as far as is comfortable). Then slowly slide your foot back down until your leg is flat on the floor again.  °• Angel Wings: Lying on your back spread your legs to the side as far apart as you can without causing discomfort.  °• Hamstring Strength:  Lying on your back, push your heel against the floor with your leg straight by tightening up the muscles of your buttocks.  Repeat, but this time bend your knee to a comfortable angle,   and push your heel against the floor.  You may put a pillow under the heel to make it more comfortable if necessary.  ° °A rehabilitation program following joint replacement surgery can speed recovery and prevent re-injury in the future due to weakened muscles. Contact your doctor or a physical therapist for more information on knee rehabilitation.  ° ° °CONSTIPATION ° °Constipation is defined medically as fewer than three stools per week and severe constipation as less than one stool per week.  Even if you have a regular bowel pattern at home, your normal regimen is likely to be disrupted due to multiple reasons following surgery.  Combination of anesthesia, postoperative narcotics, change in appetite and fluid intake all can affect your bowels.  ° °YOU MUST use at least one of the following options; they are listed in order of increasing strength to get the job done.  They are all available over the counter, and you may need to use some, POSSIBLY even all of these options:   ° °Drink plenty of fluids (prune juice may be  helpful) and high fiber foods °Colace 100 mg by mouth twice a day  °Senokot for constipation as directed and as needed Dulcolax (bisacodyl), take with full glass of water  °Miralax (polyethylene glycol) once or twice a day as needed. ° °If you have tried all these things and are unable to have a bowel movement in the first 3-4 days after surgery call either your surgeon or your primary doctor.   ° °If you experience loose stools or diarrhea, hold the medications until you stool forms back up.  If your symptoms do not get better within 1 week or if they get worse, check with your doctor.  If you experience "the worst abdominal pain ever" or develop nausea or vomiting, please contact the office immediately for further recommendations for treatment. ° ° °ITCHING:  If you experience itching with your medications, try taking only a single pain pill, or even half a pain pill at a time.  You can also use Benadryl over the counter for itching or also to help with sleep.  ° °TED HOSE STOCKINGS:  Use stockings on both legs until for at least 2 weeks or as directed by physician office. They may be removed at night for sleeping. ° °MEDICATIONS:  See your medication summary on the “After Visit Summary” that nursing will review with you.  You may have some home medications which will be placed on hold until you complete the course of blood thinner medication.  It is important for you to complete the blood thinner medication as prescribed. ° °PRECAUTIONS:  If you experience chest pain or shortness of breath - call 911 immediately for transfer to the hospital emergency department.  ° °If you develop a fever greater that 101 F, purulent drainage from wound, increased redness or drainage from wound, foul odor from the wound/dressing, or calf pain - CONTACT YOUR SURGEON.   °                                                °FOLLOW-UP APPOINTMENTS:  If you do not already have a post-op appointment, please call the office for an  appointment to be seen by your surgeon.  Guidelines for how soon to be seen are listed in your “After Visit Summary”, but are typically between 1-4   weeks after surgery. ° °OTHER INSTRUCTIONS:  ° °Knee Replacement:  Do not place pillow under knee, focus on keeping the knee straight while resting. CPM instructions: 0-90 degrees, 2 hours in the morning, 2 hours in the afternoon, and 2 hours in the evening. Place foam block, curve side up under heel at all times except when in CPM or when walking.  DO NOT modify, tear, cut, or change the foam block in any way. ° °MAKE SURE YOU:  °• Understand these instructions.  °• Get help right away if you are not doing well or get worse.  ° ° °Thank you for letting us be a part of your medical care team.  It is a privilege we respect greatly.  We hope these instructions will help you stay on track for a fast and full recovery!  ° °

## 2017-11-10 NOTE — Progress Notes (Signed)
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   Follow Up Recommendations  SNF     Equipment Recommendations  Rolling walker with 5" wheels    Recommendations for Other Services       Precautions / Restrictions Precautions Precautions: Fall Restrictions Weight Bearing Restrictions: No Other Position/Activity Restrictions: WBAT    Mobility  Bed Mobility Overal bed mobility: Needs Assistance Bed Mobility: Supine to Sit     Supine to sit: Mod assist;Max assist     General bed mobility comments: assisted to EOB by OT  Transfers Overall transfer level: Needs assistance Equipment used: Rolling walker (2 wheeled) Transfers: Sit to/from Stand Sit to Stand: Min assist;Mod assist;+2 physical assistance;+2 safety/equipment Stand pivot transfers: Mod assist;Min assist       General transfer comment: incr time, multi-modal cues for hand placement, trunk and hip extension  Ambulation/Gait Ambulation/Gait assistance: Min assist;+2 physical assistance;+2 safety/equipment Ambulation Distance (Feet): 7 Feet Assistive device: Rolling walker (2 wheeled) Gait Pattern/deviations: Step-to pattern;Decreased step length - right;Decreased step length - left;Wide base of support Gait velocity: decreased x 3   General Gait Details: very slow gait, multi-modal cues for wt shift, RW position,  sequence and step length  recliner following   Stairs            Wheelchair Mobility    Modified Rankin (Stroke Patients Only)       Balance                                            Cognition Arousal/Alertness: Awake/alert Behavior During Therapy: WFL for tasks  assessed/performed;Flat affect                                          Exercises   Total Hip Replacement TE's 10 reps ankle pumps 10 reps knee presses 10 reps heel slides  Followed by ICE     General Comments        Pertinent Vitals/Pain Pain Assessment: 0-10 Pain Score: 6  Pain Location: bil hips R>L Pain Descriptors / Indicators: Grimacing;Guarding;Discomfort;Operative site guarding Pain Intervention(s): Monitored during session;Repositioned;Ice applied;Premedicated before session    Home Living                      Prior Function            PT Goals (current goals can now be found in the care plan section) Progress towards PT goals: Progressing toward goals    Frequency    7X/week      PT Plan Current plan remains appropriate    Co-evaluation              AM-PAC PT "6 Clicks" Daily Activity  Outcome Measure  Difficulty turning over in bed (including adjusting bedclothes, sheets and blankets)?: A Lot Difficulty moving from lying on back to sitting on the side of the bed? : A Lot Difficulty sitting down on and standing up from a  chair with arms (e.g., wheelchair, bedside commode, etc,.)?: A Lot Help needed moving to and from a bed to chair (including a wheelchair)?: A Lot Help needed walking in hospital room?: A Lot Help needed climbing 3-5 steps with a railing? : A Lot 6 Click Score: 12    End of Session Equipment Utilized During Treatment: Gait belt Activity Tolerance: Patient limited by fatigue;Patient limited by pain Patient left: in chair;with call bell/phone within reach Nurse Communication: Mobility status PT Visit Diagnosis: Unsteadiness on feet (R26.81);Difficulty in walking, not elsewhere classified (R26.2);Muscle weakness (generalized) (M62.81)     Time: 1040-1109 PT Time Calculation (min) (ACUTE ONLY): 29 min  Charges:  $Gait Training: 8-22 mins $Therapeutic Exercise: 8-22 mins                    G  Codes:       Felecia Shelling  PTA [facility]  Acute  Rehab Pager      [phone]

## 2017-11-19 ENCOUNTER — Encounter (INDEPENDENT_AMBULATORY_CARE_PROVIDER_SITE_OTHER): Payer: Self-pay | Admitting: Orthopaedic Surgery

## 2017-11-19 ENCOUNTER — Ambulatory Visit (INDEPENDENT_AMBULATORY_CARE_PROVIDER_SITE_OTHER): Payer: BLUE CROSS/BLUE SHIELD | Admitting: Orthopaedic Surgery

## 2017-11-19 DIAGNOSIS — Z96643 Presence of artificial hip joint, bilateral: Secondary | ICD-10-CM

## 2017-11-19 NOTE — Progress Notes (Signed)
The patient is now 2 weeks status post bilateral total hip arthroplasties.  She is recovering in a skilled nursing facility and says she is making some progress.  On examination her staples are intact remove these and placed Steri-Strips.  Both superior aspects of her incisions need to have Bactroban ointment placed on them daily.  Her leg lengths are equal.  She tolerates me putting her hips to range of motion.  She is very slow to mobilize otherwise.  She is been on Xarelto daily and we will have her stay on this for at least 1 more week.  All questions and concerns were answered and addressed.  We will see her back in a month to see how she is doing overall.  She is been having some right shoulder issues to she wants us to consider a steroid injection in her right shoulder at that visit because the pain that she has and it pulling herself up to get out of bed this been going on for a while even before surgery.

## 2017-11-23 ENCOUNTER — Telehealth (INDEPENDENT_AMBULATORY_CARE_PROVIDER_SITE_OTHER): Payer: Self-pay

## 2017-11-23 NOTE — Telephone Encounter (Signed)
Received call from [facility] concerning patients right hip incision.  Morrie SheldonAshley, Dr. Eliberto IvoryBlackman's assistant spoke with [facility] and advised.

## 2017-12-07 ENCOUNTER — Telehealth (INDEPENDENT_AMBULATORY_CARE_PROVIDER_SITE_OTHER): Payer: Self-pay | Admitting: Orthopaedic Surgery

## 2017-12-07 NOTE — Telephone Encounter (Signed)
Holly from [facility] called wanting to let Dr. Magnus IvanBlackman know the Home Health PT orders will faxed over to sign. CB # [phone]

## 2017-12-08 ENCOUNTER — Telehealth (INDEPENDENT_AMBULATORY_CARE_PROVIDER_SITE_OTHER): Payer: Self-pay

## 2017-12-08 NOTE — Telephone Encounter (Signed)
Verbal order given  

## 2017-12-08 NOTE — Telephone Encounter (Signed)
Ryan with [facility] would like verbal orders for HHPT for 2 x week for 4 weeks for patient. Cb# is [phone], can leave a VM.  Please advise.  Thank you.

## 2017-12-17 ENCOUNTER — Encounter (INDEPENDENT_AMBULATORY_CARE_PROVIDER_SITE_OTHER): Payer: Self-pay | Admitting: Orthopaedic Surgery

## 2017-12-17 ENCOUNTER — Ambulatory Visit (INDEPENDENT_AMBULATORY_CARE_PROVIDER_SITE_OTHER): Payer: BLUE CROSS/BLUE SHIELD | Admitting: Orthopaedic Surgery

## 2017-12-17 DIAGNOSIS — Z96643 Presence of artificial hip joint, bilateral: Secondary | ICD-10-CM

## 2017-12-17 MED ORDER — DOXYCYCLINE HYCLATE 100 MG PO TABS: 100.0000 mg | ORAL_TABLET | Freq: Two times a day (BID) | ORAL | 0 refills | Status: DC

## 2017-12-17 NOTE — Progress Notes (Signed)
The patient is 6 weeks status post bilateral total hip arthroplasties.  On exam her left hip looks excellent her right hip does have a red hue around the incision.  Her primary care physician recently put her on clindamycin due to some slight drainage.  I can move her hip around without any pain at all but it is concerning seeing some redness there.  Otherwise she seems to be doing well.  She is continuing home health therapy.  She is ambulate with a walker.  She is been having some right shoulder pain using a walker and would like a steroid injection in it today and I agree with this and provided without difficulty in the right subacromial space of the shoulder.  As far as her right hip goes I am concerned about the redness someone to put her on doxycycline 100 mg twice daily the next 2 weeks.  I would like to see her back in 2 weeks see how she is doing overall or sooner if there is any issues.  All questions concerns were answered and addressed.

## 2017-12-23 ENCOUNTER — Telehealth (INDEPENDENT_AMBULATORY_CARE_PROVIDER_SITE_OTHER): Payer: Self-pay | Admitting: Orthopaedic Surgery

## 2017-12-23 NOTE — Telephone Encounter (Signed)
We need to se her either tomorrow/Thursday 4/11 of this coming Monday.

## 2017-12-23 NOTE — Telephone Encounter (Signed)
See message below °

## 2017-12-23 NOTE — Telephone Encounter (Signed)
[facility]  [phone]   DOS 12/17/17 2:15pm   Follow up sched 12/17/17 2:15pm    Surgery 11/06/17   Avascular necrosis of bones of both hips    Status post bilateral total hip replacement    New antibiotic given at last visit to help with right hip.[facility] stated hip is still swelling and warm to touch.   Pt has 10 therapy visits left

## 2017-12-24 ENCOUNTER — Encounter (INDEPENDENT_AMBULATORY_CARE_PROVIDER_SITE_OTHER): Payer: Self-pay | Admitting: Orthopaedic Surgery

## 2017-12-24 ENCOUNTER — Ambulatory Visit (INDEPENDENT_AMBULATORY_CARE_PROVIDER_SITE_OTHER): Payer: BLUE CROSS/BLUE SHIELD | Admitting: Orthopaedic Surgery

## 2017-12-24 DIAGNOSIS — Z96643 Presence of artificial hip joint, bilateral: Secondary | ICD-10-CM

## 2017-12-24 MED ORDER — CIPROFLOXACIN HCL 500 MG PO TABS: 500.0000 mg | ORAL_TABLET | Freq: Two times a day (BID) | ORAL | 0 refills | Status: DC

## 2017-12-24 NOTE — Telephone Encounter (Signed)
Can you make appt for patient see message below. Thank you.

## 2017-12-24 NOTE — Progress Notes (Signed)
The patient is now over a month status post bilateral total hip arthroplasties.  We have been concerned about the potential infection on her right hip.  She is been on doxycycline.  On examination of her right hip the incision does not appear to be significant cellulitic.  There is no drainage.  I did place a needle in the soft tissues and got minimal fluid.  I am going to have her stay on oral antibiotics.  She is ambulating without any assistive device and her pain only seems to be mild to moderate.  I would like to see her back in 1 week for repeat close follow-up.

## 2017-12-30 ENCOUNTER — Telehealth (INDEPENDENT_AMBULATORY_CARE_PROVIDER_SITE_OTHER): Payer: Self-pay | Admitting: Orthopaedic Surgery

## 2017-12-30 NOTE — Telephone Encounter (Signed)
Kaitlyn [name][facility] an [facility] of orders 1 x 3 weeks  CB # [phone]  FYI message was given to triage ... she wanted to let Dr. Magnus IvanBlackman know patients incision line is still infected (warm, red, firm ,patient has fever in the evenings). She was given an antibiotic last week but it is not resolving.Patient has an appt tomorrow at 1:30 p.m.

## 2017-12-30 NOTE — Telephone Encounter (Signed)
FYI-see below- 

## 2017-12-31 ENCOUNTER — Other Ambulatory Visit: Payer: Self-pay

## 2017-12-31 ENCOUNTER — Ambulatory Visit (INDEPENDENT_AMBULATORY_CARE_PROVIDER_SITE_OTHER): Payer: BLUE CROSS/BLUE SHIELD | Admitting: Orthopaedic Surgery

## 2017-12-31 ENCOUNTER — Encounter (INDEPENDENT_AMBULATORY_CARE_PROVIDER_SITE_OTHER): Payer: Self-pay | Admitting: Orthopaedic Surgery

## 2017-12-31 ENCOUNTER — Encounter (HOSPITAL_COMMUNITY): Payer: Self-pay | Admitting: *Deleted

## 2017-12-31 ENCOUNTER — Other Ambulatory Visit (INDEPENDENT_AMBULATORY_CARE_PROVIDER_SITE_OTHER): Payer: Self-pay | Admitting: Physician Assistant

## 2017-12-31 DIAGNOSIS — T8131XA Disruption of external operation (surgical) wound, not elsewhere classified, initial encounter: Secondary | ICD-10-CM

## 2017-12-31 NOTE — Progress Notes (Signed)
The patient comes in for continued follow-up status post bilateral hip replacements.  Left hip is done well with the right hip still has some firmness around her incision and a slight red hue.  I was able to try to aspirate fluid from it and got 9 from this.  She is been on antibiotics.  There is no significant dehiscence at all but its slight enough and is painful enough that she is wishing for operative intervention at this standpoint.  She is over a month out from her surgery.  There is some slight redness to the incision but it is more of a dark area than it is a cellulitis.  Again I could not aspirate any fluid from this.  Given that she has bilateral hip replacements and this side is painful and feels firm to her and there is questionable whether or not she has a slight dehiscence ring and some adipose tissue necrosis due to the adipose tissue in the front of the hip she does wish to have us operative intervention.  I am not worried about her hip joint or the components at all.  My goal would be to take her to the operating room tomorrow and evident the soft tissue for removal of any firm necrotic tissue in this area and then thoroughly irrigate the soft tissue layers.  I would not have her in the hospital for a few days on IV antibiotics since we are going back in.  She understands this fully and all risks and benefits were addressed and concerns were addressed as well.  We will set the surgery for tomorrow and see her back in 2 weeks.

## 2018-01-01 ENCOUNTER — Ambulatory Visit (HOSPITAL_COMMUNITY): Payer: BLUE CROSS/BLUE SHIELD | Admitting: Anesthesiology

## 2018-01-01 ENCOUNTER — Observation Stay (HOSPITAL_COMMUNITY)
Admission: RE | Admit: 2018-01-01 | Discharge: 2018-01-03 | Disposition: A | Discharge status: Home / Self Care | Payer: BLUE CROSS/BLUE SHIELD | Source: Ambulatory Visit | Attending: Orthopaedic Surgery | Admitting: Orthopaedic Surgery

## 2018-01-01 ENCOUNTER — Encounter (HOSPITAL_COMMUNITY): Payer: Self-pay

## 2018-01-01 ENCOUNTER — Encounter (HOSPITAL_COMMUNITY)
Admission: RE | Disposition: A | Discharge status: Home / Self Care | Payer: Self-pay | Source: Ambulatory Visit | Attending: Orthopaedic Surgery

## 2018-01-01 ENCOUNTER — Other Ambulatory Visit: Payer: Self-pay

## 2018-01-01 DIAGNOSIS — Z87891 Personal history of nicotine dependence: Secondary | ICD-10-CM | POA: Diagnosis not present

## 2018-01-01 DIAGNOSIS — T8141XA Infection following a procedure, superficial incisional surgical site, initial encounter: Secondary | ICD-10-CM | POA: Diagnosis not present

## 2018-01-01 DIAGNOSIS — G4700 Insomnia, unspecified: Secondary | ICD-10-CM | POA: Diagnosis not present

## 2018-01-01 DIAGNOSIS — Y9301 Activity, walking, marching and hiking: Secondary | ICD-10-CM | POA: Diagnosis not present

## 2018-01-01 DIAGNOSIS — F329 Major depressive disorder, single episode, unspecified: Secondary | ICD-10-CM | POA: Insufficient documentation

## 2018-01-01 DIAGNOSIS — K219 Gastro-esophageal reflux disease without esophagitis: Secondary | ICD-10-CM | POA: Diagnosis not present

## 2018-01-01 DIAGNOSIS — Z886 Allergy status to analgesic agent status: Secondary | ICD-10-CM | POA: Diagnosis not present

## 2018-01-01 DIAGNOSIS — L03115 Cellulitis of right lower limb: Secondary | ICD-10-CM

## 2018-01-01 DIAGNOSIS — Z96643 Presence of artificial hip joint, bilateral: Secondary | ICD-10-CM | POA: Insufficient documentation

## 2018-01-01 DIAGNOSIS — E039 Hypothyroidism, unspecified: Secondary | ICD-10-CM | POA: Insufficient documentation

## 2018-01-01 DIAGNOSIS — Z882 Allergy status to sulfonamides status: Secondary | ICD-10-CM | POA: Insufficient documentation

## 2018-01-01 DIAGNOSIS — Z79899 Other long term (current) drug therapy: Secondary | ICD-10-CM | POA: Diagnosis not present

## 2018-01-01 DIAGNOSIS — E785 Hyperlipidemia, unspecified: Secondary | ICD-10-CM | POA: Diagnosis not present

## 2018-01-01 HISTORY — PX: INCISION AND DRAINAGE HIP: SHX1801

## 2018-01-01 LAB — CREATININE, SERUM
Creatinine, Ser: 0.88 mg/dL (ref 0.44–1.00)
GFR calc Af Amer: >60 mL/min (ref >60)
GFR calc non Af Amer: >60 mL/min (ref >60)

## 2018-01-01 LAB — CBC
HEMATOCRIT: 41.6 % (ref 36.0–46.0)
Hemoglobin: 13.3 g/dL (ref 12.0–15.0)
MCH: 30 pg (ref 26.0–34.0)
MCHC: 32 g/dL (ref 30.0–36.0)
MCV: 93.9 fL (ref 78.0–100.0)
PLATELETS: 343 10*3/uL (ref 150–400)
RBC: 4.43 MIL/uL (ref 3.87–5.11)
RDW: 15.7 % — AB (ref 11.5–15.5)
WBC: 13.5 10*3/uL — ABNORMAL HIGH (ref 4.0–10.5)

## 2018-01-01 SURGERY — IRRIGATION AND DEBRIDEMENT HIP
Anesthesia: General | Site: Hip | Laterality: Right

## 2018-01-01 MED ORDER — MORPHINE SULFATE (PF) 2 MG/ML IV SOLN: 0.5000 mg | INTRAVENOUS | Status: DC | PRN

## 2018-01-01 MED ORDER — METHOCARBAMOL 1000 MG/10ML IJ SOLN
500.0000 mg | Freq: Four times a day (QID) | INTRAVENOUS | Status: DC | PRN
  Administered 2018-01-01: 500 mg via INTRAVENOUS
  Filled 2018-01-01: qty 550

## 2018-01-01 MED ORDER — MIDAZOLAM HCL 2 MG/2ML IJ SOLN
INTRAMUSCULAR | Status: AC
  Filled 2018-01-01: qty 2

## 2018-01-01 MED ORDER — LEVOTHYROXINE SODIUM 88 MCG PO TABS
88.0000 ug | ORAL_TABLET | Freq: Every day | ORAL | Status: DC
  Administered 2018-01-02: 88 ug via ORAL
  Filled 2018-01-01: qty 1

## 2018-01-01 MED ORDER — METOCLOPRAMIDE HCL 5 MG PO TABS: 5.0000 mg | ORAL_TABLET | Freq: Three times a day (TID) | ORAL | Status: DC | PRN

## 2018-01-01 MED ORDER — ACETAMINOPHEN 325 MG PO TABS: 325.0000 mg | ORAL_TABLET | Freq: Four times a day (QID) | ORAL | Status: DC | PRN

## 2018-01-01 MED ORDER — QUETIAPINE FUMARATE 100 MG PO TABS
200.0000 mg | ORAL_TABLET | Freq: Every day | ORAL | Status: DC
  Administered 2018-01-01 – 2018-01-02 (×2): 200 mg via ORAL
  Filled 2018-01-01: qty 2
  Filled 2018-01-01: qty 4

## 2018-01-01 MED ORDER — ONDANSETRON HCL 4 MG/2ML IJ SOLN: 4.0000 mg | Freq: Four times a day (QID) | INTRAMUSCULAR | Status: DC | PRN

## 2018-01-01 MED ORDER — ESCITALOPRAM OXALATE 20 MG PO TABS
20.0000 mg | ORAL_TABLET | Freq: Every day | ORAL | Status: DC
  Administered 2018-01-01 – 2018-01-02 (×2): 20 mg via ORAL
  Filled 2018-01-01 (×2): qty 1

## 2018-01-01 MED ORDER — PROPOFOL 10 MG/ML IV BOLUS
INTRAVENOUS | Status: AC
  Filled 2018-01-01: qty 20

## 2018-01-01 MED ORDER — VANCOMYCIN HCL IN DEXTROSE 1-5 GM/200ML-% IV SOLN
1000.0000 mg | INTRAVENOUS | Status: DC
  Administered 2018-01-03: 1000 mg via INTRAVENOUS
  Filled 2018-01-01: qty 200

## 2018-01-01 MED ORDER — VANCOMYCIN HCL 1000 MG IV SOLR
1000.0000 mg | Freq: Once | INTRAVENOUS | Status: DC
  Administered 2018-01-01: 1000 mg via INTRAVENOUS

## 2018-01-01 MED ORDER — PROPOFOL 10 MG/ML IV BOLUS
INTRAVENOUS | Status: DC | PRN
  Administered 2018-01-01: 120 mg via INTRAVENOUS

## 2018-01-01 MED ORDER — SODIUM CHLORIDE 0.9 % IR SOLN
Status: DC | PRN
  Administered 2018-01-01: 3000 mL

## 2018-01-01 MED ORDER — MEPERIDINE HCL 50 MG/ML IJ SOLN: 6.2500 mg | INTRAMUSCULAR | Status: DC | PRN

## 2018-01-01 MED ORDER — DOCUSATE SODIUM 100 MG PO CAPS
100.0000 mg | ORAL_CAPSULE | Freq: Two times a day (BID) | ORAL | Status: DC
  Administered 2018-01-01 – 2018-01-02 (×3): 100 mg via ORAL
  Filled 2018-01-01 (×4): qty 1

## 2018-01-01 MED ORDER — MELATONIN 5 MG PO TABS
10.0000 mg | ORAL_TABLET | Freq: Every day | ORAL | Status: DC
  Administered 2018-01-01 – 2018-01-02 (×2): 10 mg via ORAL
  Filled 2018-01-01 (×2): qty 2

## 2018-01-01 MED ORDER — CHLORHEXIDINE GLUCONATE 4 % EX LIQD: 60.0000 mL | Freq: Once | CUTANEOUS | Status: DC

## 2018-01-01 MED ORDER — DIPHENHYDRAMINE HCL 12.5 MG/5ML PO ELIX: 12.5000 mg | ORAL_SOLUTION | ORAL | Status: DC | PRN

## 2018-01-01 MED ORDER — SUCCINYLCHOLINE CHLORIDE 200 MG/10ML IV SOSY
PREFILLED_SYRINGE | INTRAVENOUS | Status: AC
  Filled 2018-01-01: qty 10

## 2018-01-01 MED ORDER — DEXAMETHASONE SODIUM PHOSPHATE 10 MG/ML IJ SOLN
INTRAMUSCULAR | Status: AC
  Filled 2018-01-01: qty 1

## 2018-01-01 MED ORDER — SODIUM CHLORIDE 0.9 % IR SOLN
Status: DC | PRN
  Administered 2018-01-01: 1000 mL

## 2018-01-01 MED ORDER — METOCLOPRAMIDE HCL 5 MG/ML IJ SOLN: 10.0000 mg | Freq: Once | INTRAMUSCULAR | Status: DC | PRN

## 2018-01-01 MED ORDER — LIDOCAINE 2% (20 MG/ML) 5 ML SYRINGE
INTRAMUSCULAR | Status: AC
  Filled 2018-01-01: qty 5

## 2018-01-01 MED ORDER — ONDANSETRON HCL 4 MG/2ML IJ SOLN
INTRAMUSCULAR | Status: DC | PRN
  Administered 2018-01-01: 4 mg via INTRAVENOUS

## 2018-01-01 MED ORDER — MIDAZOLAM HCL 2 MG/2ML IJ SOLN
INTRAMUSCULAR | Status: DC | PRN
  Administered 2018-01-01: 2 mg via INTRAVENOUS

## 2018-01-01 MED ORDER — DIVALPROEX SODIUM 250 MG PO DR TAB
500.0000 mg | DELAYED_RELEASE_TABLET | Freq: Every day | ORAL | Status: DC
  Administered 2018-01-01 – 2018-01-02 (×2): 500 mg via ORAL
  Filled 2018-01-01 (×2): qty 2

## 2018-01-01 MED ORDER — HYDROCODONE-ACETAMINOPHEN 7.5-325 MG PO TABS
1.0000 | ORAL_TABLET | ORAL | Status: DC | PRN
  Administered 2018-01-01 (×2): 1 via ORAL
  Filled 2018-01-01 (×2): qty 1

## 2018-01-01 MED ORDER — LIDOCAINE 2% (20 MG/ML) 5 ML SYRINGE
INTRAMUSCULAR | Status: DC | PRN
  Administered 2018-01-01: 100 mg via INTRAVENOUS

## 2018-01-01 MED ORDER — POLYETHYLENE GLYCOL 3350 17 G PO PACK: 17.0000 g | PACK | Freq: Every day | ORAL | Status: DC | PRN

## 2018-01-01 MED ORDER — HYDROCODONE-ACETAMINOPHEN 5-325 MG PO TABS
1.0000 | ORAL_TABLET | ORAL | Status: DC | PRN
  Administered 2018-01-02: 2 via ORAL
  Administered 2018-01-02: 1 via ORAL
  Administered 2018-01-03 (×2): 2 via ORAL
  Filled 2018-01-01: qty 2
  Filled 2018-01-01: qty 1
  Filled 2018-01-01 (×2): qty 2

## 2018-01-01 MED ORDER — ONDANSETRON HCL 4 MG PO TABS: 4.0000 mg | ORAL_TABLET | Freq: Four times a day (QID) | ORAL | Status: DC | PRN

## 2018-01-01 MED ORDER — ROCURONIUM BROMIDE 10 MG/ML (PF) SYRINGE
PREFILLED_SYRINGE | INTRAVENOUS | Status: AC
  Filled 2018-01-01: qty 5

## 2018-01-01 MED ORDER — METHOCARBAMOL 500 MG PO TABS
500.0000 mg | ORAL_TABLET | Freq: Four times a day (QID) | ORAL | Status: DC | PRN
  Administered 2018-01-02 – 2018-01-03 (×2): 500 mg via ORAL
  Filled 2018-01-01 (×2): qty 1

## 2018-01-01 MED ORDER — FLUCONAZOLE 150 MG PO TABS
150.0000 mg | ORAL_TABLET | Freq: Once | ORAL | Status: AC
  Administered 2018-01-01: 150 mg via ORAL
  Filled 2018-01-01: qty 1

## 2018-01-01 MED ORDER — FENTANYL CITRATE (PF) 100 MCG/2ML IJ SOLN
INTRAMUSCULAR | Status: AC
  Filled 2018-01-01: qty 4

## 2018-01-01 MED ORDER — GABAPENTIN 300 MG PO CAPS
300.0000 mg | ORAL_CAPSULE | ORAL | Status: DC
  Administered 2018-01-01 – 2018-01-02 (×2): 300 mg via ORAL
  Filled 2018-01-01: qty 1

## 2018-01-01 MED ORDER — MELATONIN 10 MG PO TABS: 10.0000 mg | ORAL_TABLET | Freq: Every day | ORAL | Status: DC

## 2018-01-01 MED ORDER — METOCLOPRAMIDE HCL 5 MG/ML IJ SOLN: 5.0000 mg | Freq: Three times a day (TID) | INTRAMUSCULAR | Status: DC | PRN

## 2018-01-01 MED ORDER — PANTOPRAZOLE SODIUM 40 MG PO TBEC
40.0000 mg | DELAYED_RELEASE_TABLET | Freq: Every day | ORAL | Status: DC
  Administered 2018-01-01 – 2018-01-03 (×3): 40 mg via ORAL
  Filled 2018-01-01 (×3): qty 1

## 2018-01-01 MED ORDER — VANCOMYCIN HCL IN DEXTROSE 1-5 GM/200ML-% IV SOLN: 1000.0000 mg | INTRAVENOUS | Status: DC

## 2018-01-01 MED ORDER — FENTANYL CITRATE (PF) 100 MCG/2ML IJ SOLN
INTRAMUSCULAR | Status: AC
  Filled 2018-01-01: qty 2

## 2018-01-01 MED ORDER — PHENYLEPHRINE 40 MCG/ML (10ML) SYRINGE FOR IV PUSH (FOR BLOOD PRESSURE SUPPORT)
PREFILLED_SYRINGE | INTRAVENOUS | Status: AC
  Filled 2018-01-01: qty 10

## 2018-01-01 MED ORDER — LACTATED RINGERS IV SOLN
INTRAVENOUS | Status: DC
  Administered 2018-01-01: 1000 mL via INTRAVENOUS

## 2018-01-01 MED ORDER — SODIUM CHLORIDE 0.9 % IV SOLN
INTRAVENOUS | Status: DC
  Administered 2018-01-01 – 2018-01-02 (×2): via INTRAVENOUS

## 2018-01-01 MED ORDER — LACTATED RINGERS IV SOLN: INTRAVENOUS | Status: DC

## 2018-01-01 MED ORDER — ONDANSETRON HCL 4 MG/2ML IJ SOLN
INTRAMUSCULAR | Status: AC
  Filled 2018-01-01: qty 2

## 2018-01-01 MED ORDER — PHENYLEPHRINE 40 MCG/ML (10ML) SYRINGE FOR IV PUSH (FOR BLOOD PRESSURE SUPPORT)
PREFILLED_SYRINGE | INTRAVENOUS | Status: DC | PRN
  Administered 2018-01-01: 80 ug via INTRAVENOUS

## 2018-01-01 MED ORDER — FENTANYL CITRATE (PF) 100 MCG/2ML IJ SOLN
25.0000 ug | INTRAMUSCULAR | Status: DC | PRN
  Administered 2018-01-01: 50 ug via INTRAVENOUS
  Administered 2018-01-01: 25 ug via INTRAVENOUS
  Administered 2018-01-01: 50 ug via INTRAVENOUS
  Administered 2018-01-01: 25 ug via INTRAVENOUS

## 2018-01-01 MED ORDER — VANCOMYCIN HCL IN DEXTROSE 1-5 GM/200ML-% IV SOLN
INTRAVENOUS | Status: AC
  Filled 2018-01-01: qty 200

## 2018-01-01 MED ORDER — DEXAMETHASONE SODIUM PHOSPHATE 10 MG/ML IJ SOLN
INTRAMUSCULAR | Status: DC | PRN
  Administered 2018-01-01: 10 mg via INTRAVENOUS

## 2018-01-01 SURGICAL SUPPLY — 25 items
BAG ZIPLOCK 12X15 (MISCELLANEOUS) ×3 IMPLANT
COVER SURGICAL LIGHT HANDLE (MISCELLANEOUS) ×3 IMPLANT
DRAPE INCISE IOBAN 66X45 STRL (DRAPES) ×3 IMPLANT
DRAPE ORTHO SPLIT 77X108 STRL (DRAPES) ×4
DRAPE SURG ORHT 6 SPLT 77X108 (DRAPES) ×2 IMPLANT
DRAPE U-SHAPE 47X51 STRL (DRAPES) ×3 IMPLANT
DRSG AQUACEL AG ADV 3.5X10 (GAUZE/BANDAGES/DRESSINGS) ×3 IMPLANT
DURAPREP 26ML APPLICATOR (WOUND CARE) ×3 IMPLANT
ELECT REM PT RETURN 15FT ADLT (MISCELLANEOUS) ×3 IMPLANT
EVACUATOR 1/8 PVC DRAIN (DRAIN) ×3 IMPLANT
GAUZE SPONGE 4X4 12PLY STRL (GAUZE/BANDAGES/DRESSINGS) ×3 IMPLANT
GAUZE XEROFORM 1X8 LF (GAUZE/BANDAGES/DRESSINGS) ×3 IMPLANT
GLOVE BIO SURGEON STRL SZ7.5 (GLOVE) ×3 IMPLANT
GLOVE BIOGEL PI IND STRL 8 (GLOVE) ×1 IMPLANT
GLOVE BIOGEL PI INDICATOR 8 (GLOVE) ×2
GOWN STRL REUS W/TWL XL LVL3 (GOWN DISPOSABLE) ×6 IMPLANT
HANDPIECE INTERPULSE COAX TIP (DISPOSABLE) ×2
KIT BASIN OR (CUSTOM PROCEDURE TRAY) ×3 IMPLANT
NS IRRIG 1000ML POUR BTL (IV SOLUTION) ×3 IMPLANT
PACK TOTAL JOINT WL LF (CUSTOM PROCEDURE TRAY) ×3 IMPLANT
POSITIONER SURGICAL ARM (MISCELLANEOUS) ×3 IMPLANT
SET HNDPC FAN SPRY TIP SCT (DISPOSABLE) ×1 IMPLANT
SUT ETHILON 2 0 PS N (SUTURE) ×6 IMPLANT
SUT VIC AB 0 CT1 27 (SUTURE) ×4
SUT VIC AB 0 CT1 27XBRD ANTBC (SUTURE) ×2 IMPLANT

## 2018-01-01 NOTE — Anesthesia Procedure Notes (Signed)
Procedure Name: LMA Insertion Date/Time: 01/01/2018 2:09 PM Performed by: Delphia Grateshandler, Allee Busk, CRNA Pre-anesthesia Checklist: Emergency Drugs available, Suction available, Patient being monitored and Patient identified Patient Re-evaluated:Patient Re-evaluated prior to induction Oxygen Delivery Method: Circle system utilized Preoxygenation: Pre-oxygenation with 100% oxygen Induction Type: IV induction Ventilation: Mask ventilation without difficulty LMA: LMA inserted LMA Size: 4.0 Placement Confirmation: positive ETCO2 Tube secured with: Tape Dental Injury: Teeth and Oropharynx as per pre-operative assessment

## 2018-01-01 NOTE — Progress Notes (Signed)
Pharmacy Antibiotic Note  Kaitlyn [name] is a 62 y.o. female admitted on 01/01/2018 with R hip wound cellulitis s/p THA.  Pharmacy has been consulted for vancomycin dosing.  Plan: Vancomycin 1g IV q36h for estimated AUC 486 (goal 400-500) Check vancomycin levels at steady state Follow up renal function & cultures if ordered  Height: 4\' 10"  (147.3 cm) Weight: 156 lb (70.8 kg) IBW/kg (Calculated) : 40.9  Temp (24hrs), Avg:98.1 F (36.7 C), Min:97.9 F (36.6 C), Max:98.3 F (36.8 C)  Recent Labs  Lab 01/01/18 1150 01/01/18 1704  WBC 13.5*  --   CREATININE  --  0.88    Estimated Creatinine Clearance: 55.4 mL/min (by C-G formula based on SCr of 0.88 mg/dL).    Allergies  Allergen Reactions  . Aspirin Other (See Comments)    Gi upset  . Sulfa Antibiotics Rash  . Trilafon [Perphenazine] Rash    TRILA    Antimicrobials this admission: 4/19 Vancomycin >>  Dose adjustments this admission:   Microbiology results: None  Thank you for allowing pharmacy to be a part of this patient's care.  Loralee PacasErin Markees Carns, PharmD, BCPS Pager: [phone] 01/01/2018 5:59 PM

## 2018-01-01 NOTE — Anesthesia Postprocedure Evaluation (Signed)
Anesthesia Post Note  Patient: Kaitlyn LowensteinDebra K [name]  Procedure(s) Performed: IRRIGATION AND DEBRIDEMENT RIGHT HIP INCISION (Right Hip)     Patient location during evaluation: PACU Anesthesia Type: General Level of consciousness: awake and alert Pain management: pain level controlled Vital Signs Assessment: post-procedure vital signs reviewed and stable Respiratory status: spontaneous breathing, nonlabored ventilation, respiratory function stable and patient connected to nasal cannula oxygen Cardiovascular status: blood pressure returned to baseline and stable Postop Assessment: no apparent nausea or vomiting Anesthetic complications: no    Last Vitals:  Vitals:   01/01/18 1629 01/01/18 1725  BP: (!) 144/103 122/83  Pulse: 90 87  Resp: 14 16  Temp: 36.8 C 36.6 C  SpO2: 100% 100%    Last Pain:  Vitals:   01/01/18 1725  TempSrc: Oral  PainSc:                  Phillips Groutarignan, Yovani Cogburn

## 2018-01-01 NOTE — H&P (Signed)
Kaitlyn [name] is an 62 y.o. female.   Chief Complaint: Right hip incision redness and pain status post a right total hip arthroplasty HPI: Patient is a very pleasant 62 year old female well-known to me.  She is a month out from bilateral total hip arthroplasties.  Her left hip is done well but the right hip is had an irritated incision with some redness.  We have tried oral antibiotics in the office.  She has not been septic appearing and has been afebrile but certainly there is firmness of the soft tissue and a slight cellulitis.  I recommended she undergo irrigation debridement of the soft tissue around the incision.  I do not believe this is any type of deep infection all based on her walking around easily.  I do feel that given the adipose tissue she has in the front of her thighs on both sides that this is likely some necrosis of the adipose tissue from her surgery itself and the trauma to the hip.  I have explained this to her in detail and she does wish to proceed with surgery because his irritation agree with this as well.  Past Medical History:  Diagnosis Date  . Arthritis    oa  . Depression   . Frequency of urination   . GERD (gastroesophageal reflux disease)    CONTROLLED W/ PRILOSEC  . Headache    hx of migraines  . Hyperlipemia   . Hypothyroidism   . Incontinence of urine   . Insomnia   . Interstitial cystitis   . Pelvic pain syndrome I.C.  . PONV (postoperative nausea and vomiting)   . Urgency of urination   . Wears dentures    UPPER  . Wears glasses     Past Surgical History:  Procedure Laterality Date  . BILATERAL ANTERIOR TOTAL HIP ARTHROPLASTY Bilateral 11/06/2017   Procedure: BILATERAL TOTAL HIP ARTHROPLASTY ANTERIOR APPROACH;  Surgeon: Kathryne Hitch, MD;  Location: [facility];  Service: Orthopedics;  Laterality: Bilateral;  . CYSTO WITH HYDRODISTENSION  09/18/2011   Procedure: CYSTOSCOPY/HYDRODISTENSION;  Surgeon: Anner Crete;  Location: [facility];  Service: Urology;  Laterality: N/A;  Instillation of Marcaine & Pyridium  . CYSTO WITH HYDRODISTENSION N/A 05/31/2013   Procedure: CYSTOSCOPY/HYDRODISTENSION INSTALLATION OF MARCAINE/PYRIDIUM;  Surgeon: Antony Haste, MD;  Location: [facility];  Service: Urology;  Laterality: N/A;  . CYSTO WITH HYDRODISTENSION N/A 01/24/2014   Procedure: CYSTOSCOPY/HYDRODISTENSION INSTALLATION OF MARCAINE AND PYRIDIUM;  Surgeon: Anner Crete, MD;  Location: [facility];  Service: Urology;  Laterality: N/A;  . CYSTO WITH HYDRODISTENSION N/A 08/03/2014   Procedure: CYSTO/HYDRODISTENSION OF BLADDER/INSTILL  PYRIDIUM/MARCAINE;  Surgeon: Anner Crete, MD;  Location: [facility];  Service: Urology;  Laterality: N/A;  . CYSTO WITH HYDRODISTENSION N/A 11/08/2015   Procedure: CYSTOSCOPY/HYDRODISTENSION;  Surgeon: Bjorn Pippin, MD;  Location: [facility];  Service: Urology;  Laterality: N/A;  . CYSTO WITH HYDRODISTENSION N/A 12/11/2016   Procedure: CYSTOSCOPY/HYDRODISTENSION;  Surgeon: Bjorn Pippin, MD;  Location: [facility];  Service: Urology;  Laterality: N/A;  . CYSTO/ HOD/ INSTILLATION THERAPY  LAST ONE 08-27-2010   MULTIPLE TIMES  . JOINT REPLACEMENT     bilateral hip c. Latifah Padin 11-06-17  . TRANSTHORACIC ECHOCARDIOGRAM  07-06-2012   mild LVH, ef 55-65%, grade 1 diastolic dysfunction/  mild AR/  trivial MR and TR  . VAGINAL HYSTERECTOMY  1980'S   complete    History reviewed. No pertinent family  history. Social History:  reports that she quit smoking about 13 months ago. Her smoking use included cigarettes. She has a 6.50 pack-year smoking history. She has never used smokeless tobacco. She reports that she does not drink alcohol or use drugs.  Allergies:  Allergies  Allergen Reactions  . Aspirin Other (See Comments)    Gi upset  . Sulfa Antibiotics Rash  . Trilafon [Perphenazine] Rash    TRILA    Medications Prior to  Admission  Medication Sig Dispense Refill  . ciprofloxacin (CIPRO) 500 MG tablet Take 1 tablet (500 mg total) by mouth 2 (two) times daily. 20 tablet 0  . divalproex (DEPAKOTE) 500 MG DR tablet Take 500 mg by mouth at bedtime.  3  . doxycycline (VIBRA-TABS) 100 MG tablet Take 1 tablet (100 mg total) by mouth 2 (two) times daily. 30 tablet 0  . escitalopram (LEXAPRO) 20 MG tablet Take 20 mg by mouth at bedtime.     . gabapentin (NEURONTIN) 300 MG capsule Take 300 mg by mouth 2 (two) times daily. In the afternoon and at bedtime.  1  . levothyroxine (SYNTHROID, LEVOTHROID) 88 MCG tablet Take 88 mcg by mouth at bedtime.  1  . Melatonin 10 MG TABS Take 10 mg by mouth at bedtime.    Marland Kitchen omeprazole (PRILOSEC) 20 MG capsule Take 20 mg by mouth daily.     . QUEtiapine (SEROQUEL) 200 MG tablet Take 200 mg by mouth at bedtime.    . rizatriptan (MAXALT) 10 MG tablet Take 10 mg by mouth 2 (two) times daily as needed. For migraine headaches.  3    Results for orders placed or performed during the hospital encounter of 01/01/18 (from the past 48 hour(s))  CBC     Status: Abnormal   Collection Time: 01/01/18 11:50 AM  Result Value Ref Range   WBC 13.5 (H) 4.0 - 10.5 K/uL   RBC 4.43 3.87 - 5.11 MIL/uL   Hemoglobin 13.3 12.0 - 15.0 g/dL   HCT 40.9 81.1 - 91.4 %   MCV 93.9 78.0 - 100.0 fL   MCH 30.0 26.0 - 34.0 pg   MCHC 32.0 30.0 - 36.0 g/dL   RDW 78.2 (H) 95.6 - 21.3 %   Platelets 343 150 - 400 K/uL    Comment: Performed at [facility], [address], [place], [place] [address]   No results found.  Review of Systems  All other systems reviewed and are negative.   Blood pressure (!) 159/101, pulse 94, temperature 98 F (36.7 C), temperature source Oral, resp. rate 16, height 4\' 10"  (1.473 m), weight 156 lb (70.8 kg), SpO2 98 %. Physical Exam  Constitutional: She is oriented to person, place, and time. She appears well-developed and well-nourished.  HENT:  Head: Normocephalic  and atraumatic.  Eyes: Pupils are equal, round, and reactive to light. EOM are normal.  Neck: Normal range of motion. Neck supple.  Cardiovascular: Normal rate and regular rhythm.  Respiratory: Effort normal and breath sounds normal.  GI: Soft. Bowel sounds are normal.  Musculoskeletal:       Right hip: She exhibits tenderness.       Legs: Neurological: She is alert and oriented to person, place, and time.  Skin: Skin is warm and dry.  Psychiatric: She has a normal mood and affect.     Assessment/Plan Right hip cellulitis status post a right total hip arthroplasty  We will proceed to surgery today for opening up her incision on her  right hip and removing likely necrotic adipose tissue and irrigating out the tissues around her hip.  I do not believe this is a deep infection at all based on what is been watching with her over the last several days.  We can move her hip around easily without any significant pain she is been afebrile with stable vital signs but certainly this area is different from the incision on her left hip from only one performed bilateral hip replacements 1 month ago.  Postoperatively will admit her for at least overnight observation and continued IV antibiotics until we get an idea what were dealing with in terms of whether this is just soft tissue irritation versus more extensive infection.  All questions concerns were answered and addressed.  Informed consent is obtained.  Kathryne Hitchhristopher Y Mylez Venable, MD 01/01/2018, 1:42 PM

## 2018-01-01 NOTE — Plan of Care (Signed)
  Problem: Education: Goal: Knowledge of General Education information will improve Outcome: Completed/Met

## 2018-01-01 NOTE — Brief Op Note (Signed)
01/01/2018  3:11 PM  PATIENT:  Kaitlyn [name]  62 y.o. female  PRE-OPERATIVE DIAGNOSIS:  right hip wound cellulitius   POST-OPERATIVE DIAGNOSIS:  right hip wound cellulitius  PROCEDURE:  Procedure(s): IRRIGATION AND DEBRIDEMENT RIGHT HIP INCISION (Right)  SURGEON:  Surgeon(s) and Role:    Kathryne Hitch* [place] Freyre Y, MD - Primary  ANESTHESIA:   general  EBL:  25 mL   COUNTS:  YES  DICTATION: .Other Dictation: Dictation Number [id number]  PLAN OF CARE: Admit for overnight observation  PATIENT DISPOSITION:  PACU - hemodynamically stable.   Delay start of Pharmacological VTE agent (>24hrs) due to surgical blood loss or risk of bleeding: no

## 2018-01-01 NOTE — Anesthesia Preprocedure Evaluation (Signed)
Anesthesia Evaluation  Patient identified by MRN, date of birth, ID band Patient awake    Reviewed: Allergy & Precautions, H&P , NPO status , Patient's Chart, lab work & pertinent test results  History of Anesthesia Complications (+) PONV  Airway Mallampati: III  TM Distance: >3 FB Neck ROM: Full    Dental no notable dental hx. (+) Upper Dentures, Dental Advisory Given   Pulmonary neg pulmonary ROS, former smoker,    Pulmonary exam normal breath sounds clear to auscultation       Cardiovascular negative cardio ROS   Rhythm:Regular Rate:Normal     Neuro/Psych  Headaches, Depression    GI/Hepatic Neg liver ROS, GERD  Medicated and Controlled,  Endo/Other  Hypothyroidism   Renal/GU negative Renal ROS  negative genitourinary   Musculoskeletal  (+) Arthritis , Osteoarthritis,    Abdominal   Peds  Hematology negative hematology ROS (+)   Anesthesia Other Findings   Reproductive/Obstetrics negative OB ROS                             Anesthesia Physical  Anesthesia Plan  ASA: II  Anesthesia Plan: General   Post-op Pain Management:    Induction: Intravenous  PONV Risk Score and Plan: 4 or greater and Ondansetron, Dexamethasone, Midazolam and Treatment may vary due to age or medical condition  Airway Management Planned: LMA  Additional Equipment:   Intra-op Plan:   Post-operative Plan:   Informed Consent: I have reviewed the patients History and Physical, chart, labs and discussed the procedure including the risks, benefits and alternatives for the proposed anesthesia with the patient or authorized representative who has indicated his/her understanding and acceptance.   Dental advisory given  Plan Discussed with: CRNA  Anesthesia Plan Comments:         Anesthesia Quick Evaluation

## 2018-01-01 NOTE — Transfer of Care (Signed)
Immediate Anesthesia Transfer of Care Note  Patient: Kaitlyn [name]  Procedure(s) Performed: IRRIGATION AND DEBRIDEMENT RIGHT HIP INCISION (Right Hip)  Patient Location: PACU  Anesthesia Type:General  Level of Consciousness: drowsy and patient cooperative  Airway & Oxygen Therapy: Patient Spontanous Breathing and Patient connected to face mask oxygen  Post-op Assessment: Report given to RN and Post -op Vital signs reviewed and stable  Post vital signs: Reviewed and stable  Last Vitals:  Vitals Value Taken Time  BP 171/112 01/01/2018  3:17 PM  Temp    Pulse 95 01/01/2018  3:17 PM  Resp 13 01/01/2018  3:17 PM  SpO2 97 % 01/01/2018  3:17 PM  Vitals shown include unvalidated device data.  Last Pain:  Vitals:   01/01/18 1204  TempSrc:   PainSc: 2       Patients Stated Pain Goal: 4 (01/01/18 1204)  Complications: No apparent anesthesia complications

## 2018-01-02 DIAGNOSIS — T8141XA Infection following a procedure, superficial incisional surgical site, initial encounter: Secondary | ICD-10-CM | POA: Diagnosis not present

## 2018-01-02 LAB — BASIC METABOLIC PANEL
Anion gap: 8 (ref 5–15)
BUN: 14 mg/dL (ref 6–20)
CO2: 25 mmol/L (ref 22–32)
CREATININE: 0.86 mg/dL (ref 0.44–1.00)
Calcium: 8.8 mg/dL — ABNORMAL LOW (ref 8.9–10.3)
Chloride: 108 mmol/L (ref 101–111)
GFR calc Af Amer: >60 mL/min (ref >60)
GFR calc non Af Amer: >60 mL/min (ref >60)
Glucose, Bld: 138 mg/dL — ABNORMAL HIGH (ref 65–99)
POTASSIUM: 4.4 mmol/L (ref 3.5–5.1)
Sodium: 141 mmol/L (ref 135–145)

## 2018-01-02 LAB — CBC
HEMATOCRIT: 35 % — AB (ref 36.0–46.0)
Hemoglobin: 10.7 g/dL — ABNORMAL LOW (ref 12.0–15.0)
MCH: 29.6 pg (ref 26.0–34.0)
MCHC: 30.6 g/dL (ref 30.0–36.0)
MCV: 97 fL (ref 78.0–100.0)
Platelets: 318 10*3/uL (ref 150–400)
RBC: 3.61 MIL/uL — ABNORMAL LOW (ref 3.87–5.11)
RDW: 15.8 % — AB (ref 11.5–15.5)
WBC: 12.1 10*3/uL — AB (ref 4.0–10.5)

## 2018-01-02 NOTE — Discharge Instructions (Signed)
You can get the dressing wet in the shower daily. Increase your activities as comfort allows.

## 2018-01-02 NOTE — Progress Notes (Signed)
Patient ID: Kaitlyn [name] Kings, female   DOB: 1956/01/30, 62 y.o.   MRN: [id number] Looks much better today and feels better.  I am waiting for her CBC to come back to make sure her WBC trends down.  Her incision looks good and I changed her dressings.  I would like her to have IV antibiotics today and tomorrow with likely discharging her to home tomorrow afternoon.

## 2018-01-03 DIAGNOSIS — T8141XA Infection following a procedure, superficial incisional surgical site, initial encounter: Secondary | ICD-10-CM | POA: Diagnosis not present

## 2018-01-03 LAB — BASIC METABOLIC PANEL
ANION GAP: 9 (ref 5–15)
BUN: 17 mg/dL (ref 6–20)
CHLORIDE: 106 mmol/L (ref 101–111)
CO2: 27 mmol/L (ref 22–32)
Calcium: 8.6 mg/dL — ABNORMAL LOW (ref 8.9–10.3)
Creatinine, Ser: 0.9 mg/dL (ref 0.44–1.00)
GFR calc Af Amer: >60 mL/min (ref >60)
GLUCOSE: 106 mg/dL — AB (ref 65–99)
POTASSIUM: 4.2 mmol/L (ref 3.5–5.1)
Sodium: 142 mmol/L (ref 135–145)

## 2018-01-03 MED ORDER — HYDROCODONE-ACETAMINOPHEN 5-325 MG PO TABS: 1.0000 | ORAL_TABLET | ORAL | 0 refills | Status: DC | PRN

## 2018-01-03 MED ORDER — DOXYCYCLINE HYCLATE 100 MG PO TABS: 100.0000 mg | ORAL_TABLET | Freq: Two times a day (BID) | ORAL | 0 refills | Status: DC

## 2018-01-03 MED ORDER — FLUCONAZOLE 150 MG PO TABS
150.0000 mg | ORAL_TABLET | Freq: Once | ORAL | Status: AC
  Administered 2018-01-03: 150 mg via ORAL
  Filled 2018-01-03: qty 1

## 2018-01-03 NOTE — Progress Notes (Signed)
Patient ID: Kaitlyn [name], female   DOB: October 08, 1955, 62 y.o.   MRN: [id number] Can be discharged to home today.

## 2018-01-03 NOTE — Discharge Summary (Signed)
Patient ID: Kaitlyn [name] MRN: [id number] DOB/AGE: 11/03/1955 62 y.o.  Admit date: 01/01/2018 Discharge date: 01/03/2018  Admission Diagnoses:  Principal Problem:   Cellulitis of right hip   Discharge Diagnoses:  Same  Past Medical History:  Diagnosis Date  . Arthritis    oa  . Depression   . Frequency of urination   . GERD (gastroesophageal reflux disease)    CONTROLLED W/ PRILOSEC  . Headache    hx of migraines  . Hyperlipemia   . Hypothyroidism   . Incontinence of urine   . Insomnia   . Interstitial cystitis   . Pelvic pain syndrome I.C.  . PONV (postoperative nausea and vomiting)   . Urgency of urination   . Wears dentures    UPPER  . Wears glasses     Surgeries: Procedure(s): IRRIGATION AND DEBRIDEMENT RIGHT HIP INCISION on 01/01/2018   Consultants:   Discharged Condition: Improved  Hospital Course: Kaitlyn [name] is an 62 y.o. female who was admitted 01/01/2018 for operative treatment ofCellulitis of right hip. Patient has severe unremitting pain that affects sleep, daily activities, and work/hobbies. After pre-op clearance the patient was taken to the operating room on 01/01/2018 and underwent  Procedure(s): IRRIGATION AND DEBRIDEMENT RIGHT HIP INCISION.    Patient was given perioperative antibiotics:  Anti-infectives (From admission, onward)   Start     Dose/Rate Route Frequency Ordered Stop   01/03/18 0200  vancomycin (VANCOCIN) IVPB 1000 mg/200 mL premix     1,000 mg 200 mL/hr over 60 Minutes Intravenous Every 36 hours 01/01/18 1759     01/03/18 0000  doxycycline (VIBRA-TABS) 100 MG tablet     100 mg Oral 2 times daily 01/03/18 0931     01/01/18 1800  fluconazole (DIFLUCAN) tablet 150 mg     150 mg Oral  Once 01/01/18 1631 01/01/18 1806   01/01/18 1345  vancomycin (VANCOCIN) 1,000 mg in sodium chloride 0.9 % 250 mL IVPB  Status:  Discontinued     1,000 mg 250 mL/hr over 60 Minutes Intravenous  Once 01/01/18 1343 01/01/18 1455   01/01/18 1344  vancomycin  (VANCOCIN) IVPB 1000 mg/200 mL premix  Status:  Discontinued     1,000 mg 200 mL/hr over 60 Minutes Intravenous 30 min pre-op 01/01/18 1345 01/01/18 1631   01/01/18 1342  vancomycin (VANCOCIN) 1-5 GM/200ML-% IVPB    Note to Pharmacy:  Delphia Grates   : cabinet override      01/01/18 1342 01/02/18 0159       Patient was given sequential compression devices, early ambulation, and chemoprophylaxis to prevent DVT.  Patient benefited maximally from hospital stay and there were no complications.    Recent vital signs:  Patient Vitals for the past 24 hrs:  BP Temp Temp src Pulse Resp SpO2  01/03/18 0454 136/89 97.7 F (36.5 C) Oral 71 14 97 %  01/02/18 2129 (!) 174/86 98.3 F (36.8 C) Oral 90 14 98 %  01/02/18 1357 129/80 97.9 F (36.6 C) Oral 93 16 93 %     Recent laboratory studies:  Recent Labs    01/01/18 1150  01/02/18 0554 01/03/18 0505  WBC 13.5*  --  12.1*  --   HGB 13.3  --  10.7*  --   HCT 41.6  --  35.0*  --   PLT 343  --  318  --   NA  --   --  141 142  K  --   --  4.4 4.2  CL  --   --  108 106  CO2  --   --  25 27  BUN  --   --  14 17  CREATININE  --    < > 0.86 0.90  GLUCOSE  --   --  138* 106*  CALCIUM  --    < > 8.8* 8.6*   < > = values in this interval not displayed.     Discharge Medications:   Allergies as of 01/03/2018      Reactions   Aspirin Other (See Comments)   Gi upset   Sulfa Antibiotics Rash   Trilafon [perphenazine] Rash   TRILA      Medication List    STOP taking these medications   ciprofloxacin 500 MG tablet Commonly known as:  CIPRO     TAKE these medications   divalproex 500 MG DR tablet Commonly known as:  DEPAKOTE Take 500 mg by mouth at bedtime.   doxycycline 100 MG tablet Commonly known as:  VIBRA-TABS Take 1 tablet (100 mg total) by mouth 2 (two) times daily.   escitalopram 20 MG tablet Commonly known as:  LEXAPRO Take 20 mg by mouth at bedtime.   gabapentin 300 MG capsule Commonly known as:   NEURONTIN Take 300 mg by mouth 2 (two) times daily. In the afternoon and at bedtime.   HYDROcodone-acetaminophen 5-325 MG tablet Commonly known as:  NORCO/VICODIN Take 1-2 tablets by mouth every 4 (four) hours as needed for moderate pain (pain score 4-6).   levothyroxine 88 MCG tablet Commonly known as:  SYNTHROID, LEVOTHROID Take 88 mcg by mouth at bedtime.   Melatonin 10 MG Tabs Take 10 mg by mouth at bedtime.   omeprazole 20 MG capsule Commonly known as:  PRILOSEC Take 20 mg by mouth daily.   QUEtiapine 200 MG tablet Commonly known as:  SEROQUEL Take 200 mg by mouth at bedtime.   rizatriptan 10 MG tablet Commonly known as:  MAXALT Take 10 mg by mouth 2 (two) times daily as needed. For migraine headaches.       Diagnostic Studies: No results found.  Disposition: Discharge disposition: 01-Home or Self Care       Discharge Instructions    Discharge patient   Complete by:  As directed    Discharge disposition:  01-Home or Self Care   Discharge patient date:  01/03/2018      Follow-up Information    Kathryne HitchBlackman, Maddie Brazier Y, MD. Schedule an appointment as soon as possible for a visit in 2 week(s).   Specialty:  Orthopedic Surgery Contact information: [address] [place] [address] [phone]            Signed: Kathryne HitchChristopher Y Ronnette Rump 01/03/2018, 9:31 AM

## 2018-01-03 NOTE — Progress Notes (Signed)
Patient ID: Kaitlyn [name], female   DOB: 1956/06/01, 62 y.o.   MRN: [id number] Doing well overall.  Vitals stable.  Right hip incision clean and dry.  Cellulitis resolved.

## 2018-01-04 NOTE — Op Note (Signed)
NAME:  Kaitlyn [name], Kaitlyn [name]                       ACCOUNT NO.:  MEDICAL RECORD NO.:  [id number]  LOCATION:                                 FACILITY:  PHYSICIAN:  Vanita Panda. Magnus Ivan, M.D.DATE OF BIRTH:  DATE OF PROCEDURE:  01/01/2018 DATE OF DISCHARGE:                              OPERATIVE REPORT   PREOPERATIVE DIAGNOSIS:  Right hip incisional cellulitis.  POSTOPERATIVE DIAGNOSIS:  Right hip incisional cellulitis.  PROCEDURE:  Irrigation and debridement of right hip incision including sharp excisional debridement of her incision as well as necrotic fascia and adipose tissue only.  No worrisome features for deep infection.  FINDINGS:  Necrotic adipose tissue in the superficial and deep tissues, but superficial to the tensor fascia.  Size of excisional debridement was 5 to 7 cm.  SURGEON:  Vanita Panda. Magnus Ivan, M.D.  ANESTHESIA:  General.  ANTIBIOTICS:  1 g IV vancomycin.  BLOOD LOSS:  Less than 100 cc.  COMPLICATIONS:  None.  INDICATIONS:  The patient is a 62 year old female, who just over 4 weeks status post bilateral hip replacements.  This was done through the direct anterior approach and she is walking around without any significant pain except for at her incision on her right hip.  Her left hip has not had any issues, but the right hip has had some firmness around the soft tissues.  I did drain a seroma from her hip in the office and put her on oral antibiotics.  She continued to be irritated by her wound itself in terms of some firmness, redness and associated pain.  At this point, I have recommended an excisional irrigation and debridement to ellipse out her incision with the idea of also assessing the superficial and deep tissues to make sure there was no significant infection.  She understands this fully and does wish to proceed.  PROCEDURE DESCRIPTION:  After informed consent was obtained, appropriate right hip was marked.  She was brought to the operating  room.  General anesthesia was obtained while she was on her stretcher.  She was then placed supine on the Hana fracture table with the perineal post in place and both legs in inline skeletal traction devices, but no traction applied.  We then prepped out her right hip with DuraPrep and sterile drapes.  Time-out was called and she was identified as correct patient and correct right hip.  I then used a #10 blade and went through her previous incision and then a 15-blade to ellipse out her incision.  I did find some slight purulence at one suture almost consistent with a suture abscess, but none of this was deep or superficial except for in the one area.  I did find necrotic adipose tissue and using a #10 blade as well as a rongeur.  I removed this area easily.  There was no breach in the tensor fascia, and that closure was intact and there was no fluid deep on aspiration.  I then irrigated with 3 L normal saline solution through this soft tissue area.  I then loosely reapproximated the subfascial and subcutaneous layer with interrupted 2-0 Vicryl suture followed by interrupted 2-0 nylon on  the skin.  Xeroform and well-padded sterile dressing was applied, and she was awakened, extubated and taken to the recovery room in stable condition.  All final counts were correct, and there were no complications noted.     Vanita Pandahristopher Y. Magnus IvanBlackman, M.D.     CYB/MEDQ  D:  01/01/2018  T:  01/01/2018  Job:  [id number]

## 2018-01-07 ENCOUNTER — Inpatient Hospital Stay (INDEPENDENT_AMBULATORY_CARE_PROVIDER_SITE_OTHER): Payer: BLUE CROSS/BLUE SHIELD | Admitting: Orthopaedic Surgery

## 2018-01-13 ENCOUNTER — Telehealth (INDEPENDENT_AMBULATORY_CARE_PROVIDER_SITE_OTHER): Payer: Self-pay

## 2018-01-13 NOTE — Telephone Encounter (Signed)
See below

## 2018-01-13 NOTE — Telephone Encounter (Signed)
Kaitlyn [name] [facility] with [facility] [place] called stating that patient has been having a low grade fever and bilateral knee pain in the evenings for the past week.  Stated that patient is still taking her oral antibiotics everyday and she is doing more activity than she normally does.  If there are any recommendations, please call [phone].   Patient has appt.on Thrusday, 01/14/18.

## 2018-01-14 ENCOUNTER — Ambulatory Visit (INDEPENDENT_AMBULATORY_CARE_PROVIDER_SITE_OTHER): Payer: BLUE CROSS/BLUE SHIELD | Admitting: Orthopaedic Surgery

## 2018-01-14 ENCOUNTER — Encounter (INDEPENDENT_AMBULATORY_CARE_PROVIDER_SITE_OTHER): Payer: Self-pay | Admitting: Orthopaedic Surgery

## 2018-01-14 DIAGNOSIS — Z96643 Presence of artificial hip joint, bilateral: Secondary | ICD-10-CM

## 2018-01-14 DIAGNOSIS — G8929 Other chronic pain: Secondary | ICD-10-CM

## 2018-01-14 DIAGNOSIS — M25511 Pain in right shoulder: Secondary | ICD-10-CM

## 2018-01-14 MED ORDER — FLUCONAZOLE 150 MG PO TABS: 150.0000 mg | ORAL_TABLET | Freq: Once | ORAL | 0 refills | Status: AC

## 2018-01-14 NOTE — Progress Notes (Signed)
The patient is now 2 weeks tomorrow status post irrigation debridement of the right total hip incision.  She has slight wound dehiscence and a seroma with adipose tissue necrosis.  She had no significant evidence of infection.  She is feeling much better overall.  Her bigger issue though is been her right shoulder.  We tried a steroid injection right shoulder and on exam she is definitely developing frozen shoulder.  When I had her try to abduct her shoulder she cannot even get it overhead and I can even barely get her past 90 degrees of abduction.  Has limited external rotation as well as very painful to her.  Again we tried a steroid injection subacromial space as well as activity modification and even therapy on the shoulder.  As far as her right hip goes her incision looks great.  I remove the sutures and placed Steri-Strips.  There was a moderate seroma drained about 60 cc of fluid off of the area and there is no evidence of infection at all.  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  We will see her back in 2 weeks ago the MRI with her of the right shoulder.

## 2018-01-15 ENCOUNTER — Other Ambulatory Visit (INDEPENDENT_AMBULATORY_CARE_PROVIDER_SITE_OTHER): Payer: Self-pay

## 2018-01-15 DIAGNOSIS — M25511 Pain in right shoulder: Principal | ICD-10-CM

## 2018-01-15 DIAGNOSIS — G8929 Other chronic pain: Secondary | ICD-10-CM

## 2018-01-20 ENCOUNTER — Telehealth (INDEPENDENT_AMBULATORY_CARE_PROVIDER_SITE_OTHER): Payer: Self-pay | Admitting: Orthopaedic Surgery

## 2018-01-20 NOTE — Telephone Encounter (Signed)
Ok to do

## 2018-01-20 NOTE — Telephone Encounter (Signed)
Aurther Loft from [facility] called asking to extend home health nursing for an additional 1 week 2. CB # [phone]

## 2018-01-21 ENCOUNTER — Other Ambulatory Visit: Payer: BLUE CROSS/BLUE SHIELD

## 2018-01-22 ENCOUNTER — Ambulatory Visit
Admission: RE | Admit: 2018-01-22 | Discharge: 2018-01-22 | Disposition: A | Discharge status: Home / Self Care | Payer: BLUE CROSS/BLUE SHIELD | Source: Ambulatory Visit | Attending: Orthopaedic Surgery | Admitting: Orthopaedic Surgery

## 2018-01-22 DIAGNOSIS — M25511 Pain in right shoulder: Principal | ICD-10-CM

## 2018-01-22 DIAGNOSIS — G8929 Other chronic pain: Secondary | ICD-10-CM

## 2018-01-28 ENCOUNTER — Ambulatory Visit (INDEPENDENT_AMBULATORY_CARE_PROVIDER_SITE_OTHER): Payer: BLUE CROSS/BLUE SHIELD | Admitting: Orthopaedic Surgery

## 2018-01-28 ENCOUNTER — Other Ambulatory Visit (INDEPENDENT_AMBULATORY_CARE_PROVIDER_SITE_OTHER): Payer: Self-pay

## 2018-01-28 ENCOUNTER — Encounter (INDEPENDENT_AMBULATORY_CARE_PROVIDER_SITE_OTHER): Payer: Self-pay | Admitting: Orthopaedic Surgery

## 2018-01-28 DIAGNOSIS — M25511 Pain in right shoulder: Principal | ICD-10-CM

## 2018-01-28 DIAGNOSIS — Z96643 Presence of artificial hip joint, bilateral: Secondary | ICD-10-CM

## 2018-01-28 DIAGNOSIS — G8929 Other chronic pain: Secondary | ICD-10-CM

## 2018-01-28 NOTE — Progress Notes (Signed)
The patient is coming in mainly today to go over an MRI of her right shoulder.  She is had limited shoulder function and pain with her shoulder and failed conservative treatment.  She also has a history of bilateral hip replacements that was done recently.  She is following up with that after an I&D of her right hip.  She is doing well as far as her hips ago.  She has been having bilateral hand swelling and feet swelling I recommend she see her primary care physician for this.  On examination again of her right shoulder significant limited mobility of the right shoulder.  She has limited external rotation and abduction and obvious pain.  Examination of her right hip shows incisions healed over nicely.  There is no redness no evidence of infection at all.  The MRI of her right shoulder does show significant avascular necrosis of the humeral head with an intact rotator cuff.  I would like to send her to Dr. Ave Filter at [facility] orthopedics to consider shoulder replacement surgery based on her clinical exam and MRI findings of her right shoulder.  As far as her hip goes I like to see her back in 6 weeks to see how she is doing overall.  I would like a low AP pelvis at that visit.

## 2018-03-11 ENCOUNTER — Encounter (INDEPENDENT_AMBULATORY_CARE_PROVIDER_SITE_OTHER): Payer: Self-pay | Admitting: Orthopaedic Surgery

## 2018-03-11 ENCOUNTER — Ambulatory Visit (INDEPENDENT_AMBULATORY_CARE_PROVIDER_SITE_OTHER): Payer: BLUE CROSS/BLUE SHIELD

## 2018-03-11 ENCOUNTER — Ambulatory Visit (INDEPENDENT_AMBULATORY_CARE_PROVIDER_SITE_OTHER): Payer: BLUE CROSS/BLUE SHIELD | Admitting: Orthopaedic Surgery

## 2018-03-11 DIAGNOSIS — Z96643 Presence of artificial hip joint, bilateral: Secondary | ICD-10-CM

## 2018-03-11 NOTE — Progress Notes (Signed)
HPI: [name] returns today 69 days status post irrigation debridement right hip.  She is now 4 months status post bilateral total hip arthroplasties.  She is doing well.  She has some minor pains in the hips at times but overall doing very well.  She is happy with the results.    Review of systems denies any fevers chills shortness of breath chest pain  Physical exam: Bilateral hips good range of motion without pain.  Surgical incisions healing well no signs of infection.  Ambulates without any assistive devices.  She  is able to stand on her own without any assistance.    Radiographs: AP pelvis shows bilateral hips to be well located.  Components bilateral total hips are well seated without any signs of complication.  No acute fractures.  Impression: 4 months status post bilateral total hip arthroplasty 63 days status post irrigation debridement right hip incision  Plan: Follow-up on an as needed basis or at 1 year postop.  At that time we will obtain an AP pelvis.  Questions are encouraged and answered at length today.

## 2018-03-19 ENCOUNTER — Other Ambulatory Visit: Payer: Self-pay | Admitting: Orthopedic Surgery

## 2018-04-06 ENCOUNTER — Encounter (HOSPITAL_COMMUNITY)
Admission: RE | Admit: 2018-04-06 | Discharge: 2018-04-06 | Disposition: A | Discharge status: Home / Self Care | Payer: BLUE CROSS/BLUE SHIELD | Source: Ambulatory Visit | Attending: Orthopedic Surgery | Admitting: Orthopedic Surgery

## 2018-04-06 DIAGNOSIS — Z01812 Encounter for preprocedural laboratory examination: Secondary | ICD-10-CM | POA: Diagnosis not present

## 2018-04-06 LAB — CBC WITH DIFFERENTIAL/PLATELET
Abs Immature Granulocytes: 0.4 10*3/uL — ABNORMAL HIGH (ref 0.0–0.1)
BASOS PCT: 1 %
Basophils Absolute: 0.1 10*3/uL (ref 0.0–0.1)
EOS ABS: 0.4 10*3/uL (ref 0.0–0.7)
Eosinophils Relative: 3 %
HEMATOCRIT: 35.2 % — AB (ref 36.0–46.0)
Hemoglobin: 9.8 g/dL — ABNORMAL LOW (ref 12.0–15.0)
Immature Granulocytes: 3 %
LYMPHS ABS: 3.3 10*3/uL (ref 0.7–4.0)
Lymphocytes Relative: 30 %
MCH: 26.1 pg (ref 26.0–34.0)
MCHC: 27.8 g/dL — ABNORMAL LOW (ref 30.0–36.0)
MCV: 93.6 fL (ref 78.0–100.0)
MONOS PCT: 9 %
Monocytes Absolute: 1 10*3/uL (ref 0.1–1.0)
NEUTROS PCT: 54 %
Neutro Abs: 6 10*3/uL (ref 1.7–7.7)
PLATELETS: 442 10*3/uL — AB (ref 150–400)
RBC: 3.76 MIL/uL — ABNORMAL LOW (ref 3.87–5.11)
RDW: 16 % — AB (ref 11.5–15.5)
WBC: 11.1 10*3/uL — ABNORMAL HIGH (ref 4.0–10.5)

## 2018-04-06 LAB — URINALYSIS, ROUTINE W REFLEX MICROSCOPIC
Bilirubin Urine: NEGATIVE
GLUCOSE, UA: NEGATIVE mg/dL
Ketones, ur: NEGATIVE mg/dL
NITRITE: NEGATIVE
PH: 5 (ref 5.0–8.0)
Protein, ur: NEGATIVE mg/dL
Specific Gravity, Urine: 1.013 (ref 1.005–1.030)

## 2018-04-06 LAB — COMPREHENSIVE METABOLIC PANEL WITH GFR
ALT: 8 U/L (ref 0–44)
AST: 10 U/L — ABNORMAL LOW (ref 15–41)
Albumin: 2.9 g/dL — ABNORMAL LOW (ref 3.5–5.0)
Alkaline Phosphatase: 146 U/L — ABNORMAL HIGH (ref 38–126)
Anion gap: 10 (ref 5–15)
BUN: 14 mg/dL (ref 8–23)
CO2: 22 mmol/L (ref 22–32)
Calcium: 8.8 mg/dL — ABNORMAL LOW (ref 8.9–10.3)
Chloride: 107 mmol/L (ref 98–111)
Creatinine, Ser: 1.1 mg/dL — ABNORMAL HIGH (ref 0.44–1.00)
GFR calc Af Amer: >60 mL/min
GFR calc non Af Amer: 53 mL/min — ABNORMAL LOW
Glucose, Bld: 72 mg/dL (ref 70–99)
Potassium: 4.3 mmol/L (ref 3.5–5.1)
Sodium: 139 mmol/L (ref 135–145)
Total Bilirubin: 0.4 mg/dL (ref 0.3–1.2)
Total Protein: 6.9 g/dL (ref 6.5–8.1)

## 2018-04-06 LAB — TYPE AND SCREEN
ABO/RH(D): O POS
ANTIBODY SCREEN: NEGATIVE

## 2018-04-06 LAB — APTT: aPTT: 35 seconds (ref 24–36)

## 2018-04-06 LAB — SURGICAL PCR SCREEN
MRSA, PCR: NEGATIVE
Staphylococcus aureus: NEGATIVE

## 2018-04-06 LAB — PROTIME-INR
INR: 1.01
Prothrombin Time: 13.3 s (ref 11.4–15.2)

## 2018-04-06 LAB — ABO/RH: ABO/RH(D): O POS

## 2018-04-06 NOTE — Progress Notes (Signed)
Pt left before pre-op appointment completed. Pt sts she will call back tomorrow for instructions.

## 2018-04-06 NOTE — Pre-Procedure Instructions (Signed)
Kaitlyn [name]  04/06/2018      [id number] [place] - [address] [facility] [address] [place] [address] Phone: [phone] Fax: [phone]    Your procedure is scheduled on Thurs., Aug. 1, 2019 from 10:00AM-12:04PM  [facility] "A" at 8:00AM  Call this number if you have problems the morning of surgery:  [phone]   Remember:  Do not eat or drink after midnight on July 31st   Take these medicines the morning of surgery with A SIP OF WATER: Levothyroxine (SYNTHROID, LEVOTHROID)  Omeprazole (PRILOSEC)   7 days before surgery (7/25), stop taking all Other Aspirin Products, Vitamins, Fish oils, and Herbal medications. Also stop all NSAIDS i.e. Advil, Ibuprofen, Motrin, Aleve, Anaprox, Naproxen, BC, Goody Powders, and all Supplements.    Do not wear jewelry, make-up or nail polish.  Do not wear lotions, powders, or perfumes, or deodorant.  Do not shave 48 hours prior to surgery.  .  Do not bring valuables to the hospital.  [facility] is not responsible for any belongings or valuables.  Contacts, dentures or bridgework may not be worn into surgery.  Leave your suitcase in the car.  After surgery it may be brought to your room.  For patients admitted to the hospital, discharge time will be determined by your treatment team.  Patients discharged the day of surgery will not be allowed to drive home.   Special instructions:  [facility]- Preparing For Surgery  Before surgery, you can play an important role. Because skin is not sterile, your skin needs to be as free of germs as possible. You can reduce the number of germs on your skin by washing with CHG (chlorahexidine gluconate) Soap before surgery.  CHG is an antiseptic cleaner which kills germs and bonds with the skin to continue killing germs even after washing.    Oral Hygiene is also important to reduce your risk of infection.   Remember - BRUSH YOUR TEETH THE MORNING OF SURGERY WITH YOUR REGULAR TOOTHPASTE  Please do not use if you have an allergy to CHG or antibacterial soaps. If your skin becomes reddened/irritated stop using the CHG.  Do not shave (including legs and underarms) for at least 48 hours prior to first CHG shower. It is OK to shave your face.  Please follow these instructions carefully.   1. Shower the NIGHT BEFORE SURGERY and the MORNING OF SURGERY with CHG.   2. If you chose to wash your hair, wash your hair first as usual with your normal shampoo.  3. After you shampoo, rinse your hair and body thoroughly to remove the shampoo.  4. Use CHG as you would any other liquid soap. You can apply CHG directly to the skin and wash gently with a scrungie or a clean washcloth.   5. Apply the CHG Soap to your body ONLY FROM THE NECK DOWN.  Do not use on open wounds or open sores. Avoid contact with your eyes, ears, mouth and genitals (private parts). Wash Face and genitals (private parts)  with your normal soap.  6. Wash thoroughly, paying special attention to the area where your surgery will be performed.  7. Thoroughly rinse your body with warm water from the neck down.  8. DO NOT shower/wash with your normal soap after using and rinsing off the CHG Soap.  9. Pat yourself dry with a CLEAN TOWEL.  10. Wear CLEAN PAJAMAS  to bed the night before surgery, wear comfortable clothes the morning of surgery  11. Place CLEAN SHEETS on your bed the night of your first shower and DO NOT SLEEP WITH PETS.  Day of Surgery:  Do not apply any deodorants/lotions.  Please wear clean clothes to the hospital/surgery center.   Remember to brush your teeth WITH YOUR REGULAR TOOTHPASTE.  Please read over the following fact sheets that you were given. Pain Booklet, Coughing and Deep Breathing, MRSA Information and Surgical Site Infection Prevention

## 2018-04-07 ENCOUNTER — Encounter (HOSPITAL_COMMUNITY): Payer: Self-pay

## 2018-04-07 ENCOUNTER — Other Ambulatory Visit: Payer: Self-pay

## 2018-04-07 NOTE — Progress Notes (Signed)
04/07/18 1108am PCP - Dr. Karin Lieuobins- [facility]  Cardiologist - Denies  Chest x-ray - 08/28/17 (CE)  EKG - 10/28/17 (E)  Stress Test - Denies  ECHO - 07/06/12 (E)  Cardiac Cath - Denies  Sleep Study - Denies CPAP - None  LABS- 04/06/18: CBC w/D, CMP, PT, PTT, T/S, UA, PCR  ASA- Denies   Anesthesia- Yes- abnormal UA  Pt denies having chest pain, sob, or fever at this time. All instructions explained to the pt, with a verbal understanding of the material. Pt agrees to go over the instructions while at home for a better understanding. The opportunity to ask questions was provided.

## 2018-04-07 NOTE — Progress Notes (Signed)
04/07/18 0946  [facility] from Dr. Veda Canninghandler's office regarding an abnormal UA result.

## 2018-04-14 MED ORDER — BUPIVACAINE-EPINEPHRINE (PF) 0.25% -1:200000 IJ SOLN
INTRAMUSCULAR | Status: AC
  Filled 2018-04-14: qty 60

## 2018-04-14 MED ORDER — HEPARIN SOD (PORK) LOCK FLUSH 100 UNIT/ML IV SOLN
INTRAVENOUS | Status: AC
  Filled 2018-04-14: qty 5

## 2018-04-14 MED ORDER — HEPARIN (PORCINE) IN NACL 1000-0.9 UT/500ML-% IV SOLN
INTRAVENOUS | Status: AC
  Filled 2018-04-14: qty 500

## 2018-04-14 MED ORDER — TRANEXAMIC ACID 1000 MG/10ML IV SOLN
1000.0000 mg | INTRAVENOUS | Status: AC
  Administered 2018-04-15: 1000 mg via INTRAVENOUS
  Filled 2018-04-14: qty 1100

## 2018-04-15 ENCOUNTER — Inpatient Hospital Stay (HOSPITAL_COMMUNITY): Payer: BLUE CROSS/BLUE SHIELD

## 2018-04-15 ENCOUNTER — Inpatient Hospital Stay (HOSPITAL_COMMUNITY): Payer: BLUE CROSS/BLUE SHIELD | Admitting: Certified Registered Nurse Anesthetist

## 2018-04-15 ENCOUNTER — Inpatient Hospital Stay (HOSPITAL_COMMUNITY): Payer: BLUE CROSS/BLUE SHIELD | Admitting: Physician Assistant

## 2018-04-15 ENCOUNTER — Other Ambulatory Visit: Payer: Self-pay

## 2018-04-15 ENCOUNTER — Encounter (HOSPITAL_COMMUNITY)
Admission: RE | Disposition: A | Discharge status: Home / Self Care | Payer: Self-pay | Source: Ambulatory Visit | Attending: Orthopedic Surgery

## 2018-04-15 ENCOUNTER — Inpatient Hospital Stay (HOSPITAL_COMMUNITY)
Admission: RE | Admit: 2018-04-15 | Discharge: 2018-04-16 | DRG: 483 | Disposition: A | Discharge status: Home / Self Care | Payer: BLUE CROSS/BLUE SHIELD | Source: Ambulatory Visit | Attending: Orthopedic Surgery | Admitting: Orthopedic Surgery

## 2018-04-15 ENCOUNTER — Encounter (HOSPITAL_COMMUNITY): Payer: Self-pay | Admitting: *Deleted

## 2018-04-15 DIAGNOSIS — Z96643 Presence of artificial hip joint, bilateral: Secondary | ICD-10-CM | POA: Diagnosis present

## 2018-04-15 DIAGNOSIS — Z79899 Other long term (current) drug therapy: Secondary | ICD-10-CM | POA: Diagnosis not present

## 2018-04-15 DIAGNOSIS — K219 Gastro-esophageal reflux disease without esophagitis: Secondary | ICD-10-CM | POA: Diagnosis present

## 2018-04-15 DIAGNOSIS — Z87891 Personal history of nicotine dependence: Secondary | ICD-10-CM | POA: Diagnosis not present

## 2018-04-15 DIAGNOSIS — Z96611 Presence of right artificial shoulder joint: Secondary | ICD-10-CM

## 2018-04-15 DIAGNOSIS — Z886 Allergy status to analgesic agent status: Secondary | ICD-10-CM | POA: Diagnosis not present

## 2018-04-15 DIAGNOSIS — F329 Major depressive disorder, single episode, unspecified: Secondary | ICD-10-CM | POA: Diagnosis present

## 2018-04-15 DIAGNOSIS — Z888 Allergy status to other drugs, medicaments and biological substances status: Secondary | ICD-10-CM

## 2018-04-15 DIAGNOSIS — Z882 Allergy status to sulfonamides status: Secondary | ICD-10-CM | POA: Diagnosis not present

## 2018-04-15 DIAGNOSIS — M19011 Primary osteoarthritis, right shoulder: Secondary | ICD-10-CM | POA: Diagnosis present

## 2018-04-15 DIAGNOSIS — E785 Hyperlipidemia, unspecified: Secondary | ICD-10-CM | POA: Diagnosis present

## 2018-04-15 DIAGNOSIS — E039 Hypothyroidism, unspecified: Secondary | ICD-10-CM | POA: Diagnosis present

## 2018-04-15 HISTORY — PX: TOTAL SHOULDER ARTHROPLASTY: SHX126

## 2018-04-15 SURGERY — ARTHROPLASTY, SHOULDER, TOTAL
Anesthesia: Regional | Site: Shoulder | Laterality: Right

## 2018-04-15 MED ORDER — SUGAMMADEX SODIUM 200 MG/2ML IV SOLN
INTRAVENOUS | Status: AC
  Filled 2018-04-15: qty 2

## 2018-04-15 MED ORDER — FENTANYL CITRATE (PF) 250 MCG/5ML IJ SOLN
INTRAMUSCULAR | Status: AC
Start: 2018-04-15 — End: ?
  Filled 2018-04-15: qty 5

## 2018-04-15 MED ORDER — SODIUM CHLORIDE 0.9 % IV SOLN
INTRAVENOUS | Status: DC | PRN
  Administered 2018-04-15: 50 ug/min via INTRAVENOUS

## 2018-04-15 MED ORDER — DIVALPROEX SODIUM 500 MG PO DR TAB
500.0000 mg | DELAYED_RELEASE_TABLET | Freq: Every day | ORAL | Status: DC
  Administered 2018-04-15: 500 mg via ORAL
  Filled 2018-04-15: qty 1

## 2018-04-15 MED ORDER — HYDROCODONE-ACETAMINOPHEN 5-325 MG PO TABS: 1.0000 | ORAL_TABLET | ORAL | 0 refills | Status: DC | PRN

## 2018-04-15 MED ORDER — DOCUSATE SODIUM 100 MG PO CAPS
100.0000 mg | ORAL_CAPSULE | Freq: Two times a day (BID) | ORAL | Status: DC
  Administered 2018-04-15 – 2018-04-16 (×2): 100 mg via ORAL
  Filled 2018-04-15 (×2): qty 1

## 2018-04-15 MED ORDER — MIDAZOLAM HCL 2 MG/2ML IJ SOLN
INTRAMUSCULAR | Status: AC
  Filled 2018-04-15: qty 2

## 2018-04-15 MED ORDER — MEPERIDINE HCL 50 MG/ML IJ SOLN: 6.2500 mg | INTRAMUSCULAR | Status: DC | PRN

## 2018-04-15 MED ORDER — HYDROCODONE-ACETAMINOPHEN 5-325 MG PO TABS
1.0000 | ORAL_TABLET | ORAL | Status: DC | PRN
  Administered 2018-04-16: 1 via ORAL
  Filled 2018-04-15: qty 1

## 2018-04-15 MED ORDER — FLEET ENEMA 7-19 GM/118ML RE ENEM: 1.0000 | ENEMA | Freq: Once | RECTAL | Status: DC | PRN

## 2018-04-15 MED ORDER — EPHEDRINE 5 MG/ML INJ
INTRAVENOUS | Status: AC
  Filled 2018-04-15: qty 10

## 2018-04-15 MED ORDER — ROCURONIUM BROMIDE 10 MG/ML (PF) SYRINGE
PREFILLED_SYRINGE | INTRAVENOUS | Status: AC
  Filled 2018-04-15: qty 10

## 2018-04-15 MED ORDER — DIPHENHYDRAMINE HCL 12.5 MG/5ML PO ELIX: 12.5000 mg | ORAL_SOLUTION | ORAL | Status: DC | PRN

## 2018-04-15 MED ORDER — ACETAMINOPHEN 325 MG PO TABS: 325.0000 mg | ORAL_TABLET | Freq: Four times a day (QID) | ORAL | Status: DC | PRN

## 2018-04-15 MED ORDER — DEXAMETHASONE SODIUM PHOSPHATE 10 MG/ML IJ SOLN
INTRAMUSCULAR | Status: AC
  Filled 2018-04-15: qty 1

## 2018-04-15 MED ORDER — CEFAZOLIN SODIUM-DEXTROSE 2-4 GM/100ML-% IV SOLN
2.0000 g | Freq: Four times a day (QID) | INTRAVENOUS | Status: AC
  Administered 2018-04-15 (×2): 2 g via INTRAVENOUS
  Filled 2018-04-15 (×2): qty 100

## 2018-04-15 MED ORDER — HYDROMORPHONE HCL 1 MG/ML IJ SOLN: 0.2500 mg | INTRAMUSCULAR | Status: DC | PRN

## 2018-04-15 MED ORDER — PROPOFOL 10 MG/ML IV BOLUS
INTRAVENOUS | Status: DC | PRN
  Administered 2018-04-15: 100 mg via INTRAVENOUS

## 2018-04-15 MED ORDER — BISACODYL 5 MG PO TBEC: 5.0000 mg | DELAYED_RELEASE_TABLET | Freq: Every day | ORAL | Status: DC | PRN

## 2018-04-15 MED ORDER — FENTANYL CITRATE (PF) 100 MCG/2ML IJ SOLN
50.0000 ug | Freq: Once | INTRAMUSCULAR | Status: DC
Start: 2018-04-15 — End: 2018-04-15

## 2018-04-15 MED ORDER — PHENYLEPHRINE 40 MCG/ML (10ML) SYRINGE FOR IV PUSH (FOR BLOOD PRESSURE SUPPORT)
PREFILLED_SYRINGE | INTRAVENOUS | Status: AC
  Filled 2018-04-15: qty 10

## 2018-04-15 MED ORDER — QUETIAPINE FUMARATE 400 MG PO TABS
200.0000 mg | ORAL_TABLET | Freq: Every day | ORAL | Status: DC
  Administered 2018-04-15: 200 mg via ORAL
  Filled 2018-04-15: qty 1

## 2018-04-15 MED ORDER — LEVOTHYROXINE SODIUM 88 MCG PO TABS
88.0000 ug | ORAL_TABLET | Freq: Every day | ORAL | Status: DC
  Administered 2018-04-16: 88 ug via ORAL
  Filled 2018-04-15: qty 1

## 2018-04-15 MED ORDER — SUCCINYLCHOLINE CHLORIDE 200 MG/10ML IV SOSY
PREFILLED_SYRINGE | INTRAVENOUS | Status: AC
Start: 2018-04-15 — End: ?
  Filled 2018-04-15: qty 10

## 2018-04-15 MED ORDER — MIDAZOLAM HCL 2 MG/2ML IJ SOLN
INTRAMUSCULAR | Status: AC
  Administered 2018-04-15: 2 mg
  Filled 2018-04-15: qty 2

## 2018-04-15 MED ORDER — PHENYLEPHRINE HCL 10 MG/ML IJ SOLN
INTRAMUSCULAR | Status: DC | PRN
  Administered 2018-04-15: 260 ug via INTRAVENOUS
  Administered 2018-04-15: 80 ug via INTRAVENOUS

## 2018-04-15 MED ORDER — BUPIVACAINE-EPINEPHRINE (PF) 0.5% -1:200000 IJ SOLN
INTRAMUSCULAR | Status: DC | PRN
  Administered 2018-04-15: 20 mL via PERINEURAL

## 2018-04-15 MED ORDER — PANTOPRAZOLE SODIUM 40 MG PO TBEC
40.0000 mg | DELAYED_RELEASE_TABLET | Freq: Every day | ORAL | Status: DC
  Administered 2018-04-16: 40 mg via ORAL
  Filled 2018-04-15: qty 1

## 2018-04-15 MED ORDER — MENTHOL 3 MG MT LOZG: 1.0000 | LOZENGE | OROMUCOSAL | Status: DC | PRN

## 2018-04-15 MED ORDER — ONDANSETRON HCL 4 MG/2ML IJ SOLN
INTRAMUSCULAR | Status: DC | PRN
  Administered 2018-04-15: 4 mg via INTRAVENOUS

## 2018-04-15 MED ORDER — LIDOCAINE HCL (CARDIAC) PF 100 MG/5ML IV SOSY
PREFILLED_SYRINGE | INTRAVENOUS | Status: DC | PRN
  Administered 2018-04-15: 100 mg via INTRAVENOUS

## 2018-04-15 MED ORDER — SODIUM CHLORIDE 0.9 % IR SOLN
Status: DC | PRN
  Administered 2018-04-15: 3000 mL

## 2018-04-15 MED ORDER — HYDROCODONE-ACETAMINOPHEN 7.5-325 MG PO TABS
1.0000 | ORAL_TABLET | ORAL | Status: DC | PRN
  Administered 2018-04-15: 2 via ORAL
  Administered 2018-04-16: 1 via ORAL
  Filled 2018-04-15: qty 1
  Filled 2018-04-15: qty 2

## 2018-04-15 MED ORDER — MIDAZOLAM HCL 2 MG/2ML IJ SOLN: 2.0000 mg | Freq: Once | INTRAMUSCULAR | Status: DC

## 2018-04-15 MED ORDER — ASPIRIN EC 81 MG PO TBEC
81.0000 mg | DELAYED_RELEASE_TABLET | Freq: Two times a day (BID) | ORAL | Status: DC
  Administered 2018-04-15 – 2018-04-16 (×2): 81 mg via ORAL
  Filled 2018-04-15 (×2): qty 1

## 2018-04-15 MED ORDER — ROCURONIUM BROMIDE 100 MG/10ML IV SOLN
INTRAVENOUS | Status: DC | PRN
  Administered 2018-04-15: 50 mg via INTRAVENOUS

## 2018-04-15 MED ORDER — SENNOSIDES-DOCUSATE SODIUM 8.6-50 MG PO TABS: 1.0000 | ORAL_TABLET | Freq: Every evening | ORAL | Status: DC | PRN

## 2018-04-15 MED ORDER — ACETAMINOPHEN 500 MG PO TABS
500.0000 mg | ORAL_TABLET | Freq: Four times a day (QID) | ORAL | Status: AC
  Administered 2018-04-15 – 2018-04-16 (×3): 500 mg via ORAL
  Filled 2018-04-15 (×3): qty 1

## 2018-04-15 MED ORDER — FENTANYL CITRATE (PF) 100 MCG/2ML IJ SOLN
INTRAMUSCULAR | Status: AC
  Administered 2018-04-15: 50 ug
  Filled 2018-04-15: qty 2

## 2018-04-15 MED ORDER — ALUMINUM HYDROXIDE GEL 320 MG/5ML PO SUSP: 15.0000 mL | ORAL | Status: DC | PRN

## 2018-04-15 MED ORDER — CEFAZOLIN SODIUM-DEXTROSE 2-4 GM/100ML-% IV SOLN
2.0000 g | INTRAVENOUS | Status: AC
  Administered 2018-04-15: 2 g via INTRAVENOUS
  Filled 2018-04-15: qty 100

## 2018-04-15 MED ORDER — ESCITALOPRAM OXALATE 10 MG PO TABS
20.0000 mg | ORAL_TABLET | Freq: Every day | ORAL | Status: DC
  Administered 2018-04-15: 20 mg via ORAL
  Filled 2018-04-15: qty 2

## 2018-04-15 MED ORDER — PHENOL 1.4 % MT LIQD: 1.0000 | OROMUCOSAL | Status: DC | PRN

## 2018-04-15 MED ORDER — METHOCARBAMOL 500 MG PO TABS
500.0000 mg | ORAL_TABLET | Freq: Four times a day (QID) | ORAL | Status: DC | PRN
  Administered 2018-04-16: 500 mg via ORAL
  Filled 2018-04-15: qty 1

## 2018-04-15 MED ORDER — POVIDONE-IODINE 7.5 % EX SOLN
Freq: Once | CUTANEOUS | Status: DC
  Administered 2018-04-15: 17:00:00 via TOPICAL
  Filled 2018-04-15: qty 118

## 2018-04-15 MED ORDER — LACTATED RINGERS IV SOLN
INTRAVENOUS | Status: DC
  Administered 2018-04-15: 08:00:00 via INTRAVENOUS

## 2018-04-15 MED ORDER — 0.9 % SODIUM CHLORIDE (POUR BTL) OPTIME
TOPICAL | Status: DC | PRN
  Administered 2018-04-15: 1000 mL

## 2018-04-15 MED ORDER — EPHEDRINE SULFATE 50 MG/ML IJ SOLN
INTRAMUSCULAR | Status: DC | PRN
  Administered 2018-04-15: 5 mg via INTRAVENOUS

## 2018-04-15 MED ORDER — GABAPENTIN 300 MG PO CAPS
300.0000 mg | ORAL_CAPSULE | Freq: Every day | ORAL | Status: DC
  Administered 2018-04-15: 300 mg via ORAL
  Filled 2018-04-15: qty 1

## 2018-04-15 MED ORDER — ONDANSETRON HCL 4 MG/2ML IJ SOLN: 4.0000 mg | Freq: Four times a day (QID) | INTRAMUSCULAR | Status: DC | PRN

## 2018-04-15 MED ORDER — METHOCARBAMOL 1000 MG/10ML IJ SOLN
500.0000 mg | Freq: Four times a day (QID) | INTRAVENOUS | Status: DC | PRN
  Filled 2018-04-15: qty 5

## 2018-04-15 MED ORDER — BUPIVACAINE LIPOSOME 1.3 % IJ SUSP
INTRAMUSCULAR | Status: DC | PRN
  Administered 2018-04-15: 10 mL via PERINEURAL

## 2018-04-15 MED ORDER — METOCLOPRAMIDE HCL 5 MG PO TABS: 5.0000 mg | ORAL_TABLET | Freq: Three times a day (TID) | ORAL | Status: DC | PRN

## 2018-04-15 MED ORDER — ONDANSETRON HCL 4 MG/2ML IJ SOLN
INTRAMUSCULAR | Status: AC
  Filled 2018-04-15: qty 2

## 2018-04-15 MED ORDER — MORPHINE SULFATE (PF) 2 MG/ML IV SOLN
0.5000 mg | INTRAVENOUS | Status: DC | PRN
  Administered 2018-04-16: 0.5 mg via INTRAVENOUS
  Filled 2018-04-15: qty 1

## 2018-04-15 MED ORDER — SODIUM CHLORIDE 0.9 % IV SOLN
INTRAVENOUS | Status: AC
  Administered 2018-04-15 (×2): via INTRAVENOUS

## 2018-04-15 MED ORDER — ONDANSETRON HCL 4 MG/2ML IJ SOLN: 4.0000 mg | Freq: Once | INTRAMUSCULAR | Status: DC | PRN

## 2018-04-15 MED ORDER — PROPOFOL 10 MG/ML IV BOLUS
INTRAVENOUS | Status: AC
  Filled 2018-04-15: qty 20

## 2018-04-15 MED ORDER — DEXAMETHASONE SODIUM PHOSPHATE 10 MG/ML IJ SOLN
INTRAMUSCULAR | Status: DC | PRN
  Administered 2018-04-15: 10 mg via INTRAVENOUS

## 2018-04-15 MED ORDER — METOCLOPRAMIDE HCL 5 MG/ML IJ SOLN: 5.0000 mg | Freq: Three times a day (TID) | INTRAMUSCULAR | Status: DC | PRN

## 2018-04-15 MED ORDER — ONDANSETRON HCL 4 MG PO TABS
4.0000 mg | ORAL_TABLET | Freq: Four times a day (QID) | ORAL | Status: DC | PRN
Start: 2018-04-15 — End: 2018-04-16

## 2018-04-15 SURGICAL SUPPLY — 66 items
BIT DRILL 5/64X5 DISP (BIT) ×2 IMPLANT
BLADE SAW SAG 73X25 THK (BLADE) ×1
BLADE SAW SGTL 73X25 THK (BLADE) ×1 IMPLANT
BLADE SURG 15 STRL LF DISP TIS (BLADE) ×1 IMPLANT
BLADE SURG 15 STRL SS (BLADE) ×1
CEMENT BONE DEPUY (Cement) ×2 IMPLANT
CHLORAPREP W/TINT 26ML (MISCELLANEOUS) ×2 IMPLANT
COVER SURGICAL LIGHT HANDLE (MISCELLANEOUS) ×2 IMPLANT
DRAPE INCISE IOBAN 66X45 STRL (DRAPES) ×2 IMPLANT
DRAPE ORTHO SPLIT 77X108 STRL (DRAPES) ×2
DRAPE SURG 17X23 STRL (DRAPES) ×2 IMPLANT
DRAPE SURG ORHT 6 SPLT 77X108 (DRAPES) ×2 IMPLANT
DRAPE U-SHAPE 47X51 STRL (DRAPES) ×2 IMPLANT
DRSG AQUACEL AG ADV 3.5X10 (GAUZE/BANDAGES/DRESSINGS) IMPLANT
ELECT BLADE 4.0 EZ CLEAN MEGAD (MISCELLANEOUS)
ELECT REM PT RETURN 9FT ADLT (ELECTROSURGICAL) ×2
ELECTRODE BLDE 4.0 EZ CLN MEGD (MISCELLANEOUS) IMPLANT
ELECTRODE REM PT RTRN 9FT ADLT (ELECTROSURGICAL) ×1 IMPLANT
GLENOID PEG SHOULDER 40MM SML (Shoulder) ×1 IMPLANT
GLOVE BIO SURGEON STRL SZ7 (GLOVE) ×2 IMPLANT
GLOVE BIO SURGEON STRL SZ7.5 (GLOVE) ×2 IMPLANT
GLOVE BIOGEL PI IND STRL 7.0 (GLOVE) ×1 IMPLANT
GLOVE BIOGEL PI IND STRL 8 (GLOVE) ×1 IMPLANT
GLOVE BIOGEL PI INDICATOR 7.0 (GLOVE) ×1
GLOVE BIOGEL PI INDICATOR 8 (GLOVE) ×1
GOWN STRL REUS W/ TWL LRG LVL3 (GOWN DISPOSABLE) ×1 IMPLANT
GOWN STRL REUS W/ TWL XL LVL3 (GOWN DISPOSABLE) ×1 IMPLANT
GOWN STRL REUS W/TWL LRG LVL3 (GOWN DISPOSABLE) ×1
GOWN STRL REUS W/TWL XL LVL3 (GOWN DISPOSABLE) ×1
GUIDEWIRE GLENOID 2.5X220 (WIRE) ×2 IMPLANT
HANDPIECE INTERPULSE COAX TIP (DISPOSABLE) ×1
HEAD HUM 1.5XLO OFST 15X41 (Stem) ×1 IMPLANT
HEAD HUM AEQUALIS 41X15 (Stem) ×1 IMPLANT
HEMOSTAT SURGICEL 2X14 (HEMOSTASIS) ×2 IMPLANT
HOOD PEEL AWAY FLYTE STAYCOOL (MISCELLANEOUS) ×4 IMPLANT
KIT BASIN OR (CUSTOM PROCEDURE TRAY) ×2 IMPLANT
KIT TURNOVER KIT B (KITS) ×2 IMPLANT
MANIFOLD NEPTUNE II (INSTRUMENTS) ×2 IMPLANT
NEEDLE MAYO TROCAR (NEEDLE) ×2 IMPLANT
NS IRRIG 1000ML POUR BTL (IV SOLUTION) ×2 IMPLANT
PACK SHOULDER (CUSTOM PROCEDURE TRAY) ×2 IMPLANT
PAD ARMBOARD 7.5X6 YLW CONV (MISCELLANEOUS) ×4 IMPLANT
RESTRAINT HEAD UNIVERSAL NS (MISCELLANEOUS) ×2 IMPLANT
RETRIEVER SUT HEWSON (MISCELLANEOUS) ×2 IMPLANT
SET HNDPC FAN SPRY TIP SCT (DISPOSABLE) ×1 IMPLANT
SHOULDER GLENOID PEG 40MM SML (Shoulder) ×2 IMPLANT
SLING ARM FOAM STRAP LRG (SOFTGOODS) ×2 IMPLANT
SLING ARM FOAM STRAP MED (SOFTGOODS) IMPLANT
SMARTMIX MINI TOWER (MISCELLANEOUS) ×2
SPONGE LAP 18X18 X RAY DECT (DISPOSABLE) ×2 IMPLANT
SPONGE LAP 4X18 RFD (DISPOSABLE) IMPLANT
STEM HUM AEQUALIS PF SZ1A 66 (Stem) ×2 IMPLANT
STRIP CLOSURE SKIN 1/2X4 (GAUZE/BANDAGES/DRESSINGS) ×2 IMPLANT
SUCTION FRAZIER HANDLE 10FR (MISCELLANEOUS) ×1
SUCTION TUBE FRAZIER 10FR DISP (MISCELLANEOUS) ×1 IMPLANT
SUPPORT WRAP ARM LG (MISCELLANEOUS) ×2 IMPLANT
SUT ETHIBOND NAB CT1 #1 30IN (SUTURE) ×6 IMPLANT
SUT FIBERWIRE #2 38 T-5 BLUE (SUTURE)
SUT MNCRL AB 4-0 PS2 18 (SUTURE) ×2 IMPLANT
SUT VIC AB 2-0 CT1 27 (SUTURE) ×1
SUT VIC AB 2-0 CT1 TAPERPNT 27 (SUTURE) ×1 IMPLANT
SUTURE FIBERWR #2 38 T-5 BLUE (SUTURE) IMPLANT
TAPE LABRALWHITE 1.5X36 (TAPE) ×2 IMPLANT
TAPE SUT LABRALTAP WHT/BLK (SUTURE) ×2 IMPLANT
TOWEL OR 17X26 10 PK STRL BLUE (TOWEL DISPOSABLE) ×2 IMPLANT
TOWER SMARTMIX MINI (MISCELLANEOUS) ×1 IMPLANT

## 2018-04-15 NOTE — Anesthesia Preprocedure Evaluation (Signed)
Anesthesia Evaluation  Patient identified by MRN, date of birth, ID band Patient awake    Reviewed: Allergy & Precautions, NPO status , Patient's Chart, lab work & pertinent test results  History of Anesthesia Complications (+) PONV  Airway Mallampati: I  TM Distance: >3 FB Neck ROM: Full    Dental   Pulmonary former smoker,    Pulmonary exam normal        Cardiovascular Normal cardiovascular exam     Neuro/Psych Depression    GI/Hepatic GERD  Medicated and Controlled,  Endo/Other    Renal/GU      Musculoskeletal   Abdominal   Peds  Hematology   Anesthesia Other Findings   Reproductive/Obstetrics                             Anesthesia Physical Anesthesia Plan  ASA: II  Anesthesia Plan: General   Post-op Pain Management:  Regional for Post-op pain   Induction:   PONV Risk Score and Plan: 4 or greater and Ondansetron, Dexamethasone and Midazolam  Airway Management Planned: Oral ETT  Additional Equipment:   Intra-op Plan:   Post-operative Plan: Extubation in OR  Informed Consent: I have reviewed the patients History and Physical, chart, labs and discussed the procedure including the risks, benefits and alternatives for the proposed anesthesia with the patient or authorized representative who has indicated his/her understanding and acceptance.     Plan Discussed with: CRNA and Surgeon  Anesthesia Plan Comments:         Anesthesia Quick Evaluation

## 2018-04-15 NOTE — H&P (Addendum)
Kaitlyn [name] is an 62 y.o. female.   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  Pain interferes with sleep and quality of life.   Past Medical History:  Diagnosis Date  . Arthritis    oa  . Depression   . Frequency of urination   . GERD (gastroesophageal reflux disease)    CONTROLLED W/ PRILOSEC  . Headache    hx of migraines  . Hyperlipemia   . Hypothyroidism   . Incontinence of urine   . Insomnia   . Interstitial cystitis   . Pelvic pain syndrome I.C.  . PONV (postoperative nausea and vomiting)   . Urgency of urination   . Wears dentures    UPPER  . Wears glasses     Past Surgical History:  Procedure Laterality Date  . BILATERAL ANTERIOR TOTAL HIP ARTHROPLASTY Bilateral 11/06/2017   Procedure: BILATERAL TOTAL HIP ARTHROPLASTY ANTERIOR APPROACH;  Surgeon: Kathryne HitchBlackman, Christopher Y, MD;  Location: [facility];  Service: Orthopedics;  Laterality: Bilateral;  . CYSTO WITH HYDRODISTENSION  09/18/2011   Procedure: CYSTOSCOPY/HYDRODISTENSION;  Surgeon: Anner CreteJohn J Wrenn;  Location: [facility];  Service: Urology;  Laterality: N/A;  Instillation of Marcaine & Pyridium  . CYSTO WITH HYDRODISTENSION N/A 05/31/2013   Procedure: CYSTOSCOPY/HYDRODISTENSION INSTALLATION OF MARCAINE/PYRIDIUM;  Surgeon: Antony HasteMatthew Ramsey Eskridge, MD;  [facility] [place];  Service: Urology;  Laterality: N/A;  . CYSTO WITH HYDRODISTENSION N/A 01/24/2014   Procedure: CYSTOSCOPY/HYDRODISTENSION INSTALLATION OF MARCAINE AND PYRIDIUM;  Surgeon: Anner CreteJohn J Wrenn, MD;  [facility] [place];  Service: Urology;  Laterality: N/A;  . CYSTO WITH HYDRODISTENSION N/A 08/03/2014   Procedure: CYSTO/HYDRODISTENSION OF BLADDER/INSTILL  PYRIDIUM/MARCAINE;  Surgeon: Anner CreteJohn J Wrenn, MD;  [facility] [place];  Service: Urology;  Laterality: N/A;  . CYSTO WITH  HYDRODISTENSION N/A 11/08/2015   Procedure: CYSTOSCOPY/HYDRODISTENSION;  Surgeon: Bjorn PippinJohn Wrenn, MD;  [facility] [place];  Service: Urology;  Laterality: N/A;  . CYSTO WITH HYDRODISTENSION N/A 12/11/2016   Procedure: CYSTOSCOPY/HYDRODISTENSION;  Surgeon: Bjorn PippinJohn Wrenn, MD;  [facility] [place];  Service: Urology;  Laterality: N/A;  . CYSTO/ HOD/ INSTILLATION THERAPY  LAST ONE 08-27-2010   MULTIPLE TIMES  . INCISION AND DRAINAGE HIP Right 01/01/2018   Procedure: IRRIGATION AND DEBRIDEMENT RIGHT HIP INCISION;  Surgeon: Kathryne HitchBlackman, Christopher Y, MD;  Location: [facility];  Service: Orthopedics;  Laterality: Right;  . JOINT REPLACEMENT     bilateral hip c. blackman 11-06-17  . TONSILLECTOMY    . TRANSTHORACIC ECHOCARDIOGRAM  07-06-2012   mild LVH, ef 55-65%, grade 1 diastolic dysfunction/  mild AR/  trivial MR and TR  . VAGINAL HYSTERECTOMY  1980'S   complete    History reviewed. No pertinent family history. Social History:  reports that she quit smoking about 17 months ago. Her smoking use included cigarettes. She has a 6.50 pack-year smoking history. She has never used smokeless tobacco. She reports that she does not drink alcohol or use drugs.  Allergies:  Allergies  Allergen Reactions  . Aspirin Other (See Comments)    Gi upset  . Sulfa Antibiotics Rash  . Trilafon [Perphenazine] Rash    TRILA    Medications Prior to Admission  Medication Sig Dispense Refill  . divalproex (DEPAKOTE) 500 MG DR tablet Take 500 mg by mouth at bedtime.  3  . escitalopram (LEXAPRO) 20 MG tablet Take 20 mg by mouth at bedtime.     .Marland Kitchen  gabapentin (NEURONTIN) 300 MG capsule Take 300 mg by mouth at bedtime. at bedtime.  1  . levothyroxine (SYNTHROID, LEVOTHROID) 88 MCG tablet Take 88 mcg by mouth daily before breakfast.   1  . Melatonin 10 MG TABS Take 10 mg by mouth at bedtime.    Marland Kitchen omeprazole (PRILOSEC) 20 MG capsule Take 20 mg by mouth every morning.     Marland Kitchen QUEtiapine (SEROQUEL) 200  MG tablet Take 200 mg by mouth at bedtime.    . rizatriptan (MAXALT) 10 MG tablet Take 10 mg by mouth 2 (two) times daily as needed. For migraine headaches.  3    No results found for this or any previous visit (from the past 48 hour(s)). No results found.  Review of Systems  All other systems reviewed and are negative.   Blood pressure (!) 145/91, pulse (!) 117, temperature 98.5 F (36.9 C), temperature source Oral, resp. rate (!) 23, weight 73.9 kg (163 lb), SpO2 93 %. Physical Exam  Constitutional: She is oriented to person, place, and time. She appears well-developed and well-nourished.  HENT:  Head: Atraumatic.  Eyes: EOM are normal.  Cardiovascular: Intact distal pulses.  Respiratory: Effort normal.  Neurological: She is alert and oriented to person, place, and time.  Skin: Skin is warm and dry.  Psychiatric: She has a normal mood and affect.     Assessment/Plan R shoulder endstage AVN with collapse Plan R TSA Risks / benefits of surgery discussed Consent on chart  NPO for OR Preop antibiotics   Berline Lopes, MD 04/15/2018, 9:29 AM

## 2018-04-15 NOTE — Op Note (Signed)
Procedure(s): Procedure Note  Kaitlyn [name] female 62 y.o. 04/15/2018  Procedure(s) and Anesthesia Type:    * RIGHT TOTAL SHOULDER ARTHROPLASTY - General      RIGHT LONG HEAD BICEPS TENODESIS  Surgeon(s) and Role:    Jones Broom, MD - Primary   Indications:  62 y.o. female  With endstage right shoulder arthritis. Pain and dysfunction interfered with quality of life and nonoperative treatment with activity modification, NSAIDS and injections failed.     Surgeon: Berline Lopes   Assistants: Damita Lack PA-C [facility] was present and scrubbed throughout the procedure and was essential in positioning, retraction, exposure, and closure)  Anesthesia: General endotracheal anesthesia with preoperative interscalene block given by the attending anesthesiologist     Procedure Detail   Findings: Tornier flex anatomic press-fit size 1 stem with a 41 head, cemented size 40S Cortiloc glenoid.   A lesser tuberosity osteotomy was performed and repaired at the conclusion of the procedure.  Estimated Blood Loss:  200 mL         Drains: None   Blood Given: none          Specimens: none        Complications:  * No complications entered in OR log *         Disposition: PACU - hemodynamically stable.         Condition: stable    Procedure:   The patient was identified in the preoperative holding area where I personally marked the operative extremity after verifying with the patient and consent. She  was taken to the operating room where She was transferred to the   operative table.  The patient received an interscalene block in   the holding area by the attending anesthesiologist.  General anesthesia was induced   in the operating room without complication.  The patient did receive IV  Ancef prior to the commencement of the procedure.  The patient was   placed in the beach-chair position with the back raised about 30   degrees.  The nonoperative extremity and head and  neck were carefully   positioned and padded protecting against neurovascular compromise.  The   left upper extremity was then prepped and draped in the standard sterile   fashion.    The appropriate operative time-out was performed with   Anesthesia, the perioperative staff, as well as myself and we all agreed   that the right side was the correct operative site.  The patient received 1 g IV tranexamic acid at the start of the case around time of the incision. An approximately   10 cm incision was made from the tip of the coracoid to the center point of the   humerus at the level of the axilla.  Dissection was carried down sharply   through subcutaneous tissues and cephalic vein was identified and taken   laterally with the deltoid.  The pectoralis major was taken medially.  The   upper 1 cm of the pectoralis major was released from its attachment on   the humerus.  The clavipectoral fascia was incised just lateral to the   conjoined tendon.  This incision was carried up to but not into the   coracoacromial ligament.  Digital palpation was used to prove   integrity of the axillary nerve which was protected throughout the   procedure.  Musculocutaneous nerve was not palpated in the operative   field.  Conjoined tendon was then retracted gently medially and the  deltoid laterally.  Anterior circumflex humeral vessels were clamped and   coagulated.  The soft tissues overlying the biceps was incised and this   incision was carried across the transverse humeral ligament to the base   of the coracoid.  The biceps was noted to be severely degenerated. It was released from the superior labrum. The biceps was then tenodesed to the soft tissue just above   pectoralis major and the remaining portion of the biceps superiorly was   excised.  An osteotomy was performed at the lesser tuberosity.  The capsule was then   released all the way down to the 6 o'clock position of the humeral head.   The humeral  head was then delivered with simultaneous adduction,   extension and external rotation.  All humeral osteophytes were removed   and the anatomic neck of the humerus was marked and cut free hand at   approximately 25 degrees retroversion within about 3 mm of the cuff   reflection posteriorly.  The head size was estimated to be a 41 medium   offset.  At that point, the humeral head was retracted posteriorly with   a Fukuda retractor.   Remaining portion of the capsule was released at the base of the   coracoid.  The remaining biceps anchor and the entire anterior-inferior   labrum was excised.  The posterior labrum was also excised but the   posterior capsule was not released.  The guidepin was placed bicortically with non elevated guide.  The reamer was used to ream to concentric bone with punctate bleeding.  This gave an excellent concentric surface.  The center hole was then drilled for an anchor peg glenoid followed by the three peripheral holes and none of the holes   exited the glenoid wall.  I then pulse irrigated these holes and dried   them with Surgicel.  The three peripheral holes were then   pressurized cemented and the anchor peg glenoid was placed and impacted   with an excellent fit.  The glenoid was a 40s component.  The proximal humerus was then again exposed taking care not to displace the glenoid.    The entry awl was used followed by sounding reamers and then broached for a size 1.  This was then left in place and the calcar planer was used. Trial head was placed with a 41.  With the trial implantation of the component,  there was approximately 50% posterior translation with immediate snap back to the   anatomic position.  With forward elevation, there was no tendency   towards posterior subluxation.   The trial was removed and the final implant was prepared on a back table.  The trial was removed and the final implant was prepared on a back table.   3 small holes were drilled on  the medial side of the lesser tuberosity osteotomy, through which 2 labral tapes were passed. The implant was then placed through the loop of the 2 labral tapes and impacted with an excellent press-fit. This achieved excellent anatomic reconstruction of the proximal humerus.  The joint was then copiously irrigated with pulse lavage.  The subscapularis and   lesser tuberosity osteotomy were then repaired using the 2 labral tapes previously passed in a double row fashion with horizontal mattress sutures medially brought over through bone tunnels tied over a bone bridge laterally.   One #1 Ethibond was placed at the rotator interval just above   the lesser tuberosity. Copious irrigation  was used. Skin was closed with 2-0 Vicryl sutures in the deep dermal layer and 4-0 Monocryl in a subcuticular  running fashion.  Sterile dressings were then applied including Aquacel.  The patient was placed in a sling and allowed to awaken from general anesthesia and taken to the recovery room in stable condition.      POSTOPERATIVE PLAN:  Early passive range of motion will be allowed with the goal of 0 degrees external rotation and 90 degrees forward elevation.  No internal rotation at this time.  No active motion of the arm until the lesser tuberosity heals.  The patient will likely be kept in the hospital for 1-2 days and then discharged home.

## 2018-04-15 NOTE — Anesthesia Postprocedure Evaluation (Signed)
Anesthesia Post Note  Patient: Kaitlyn LowensteinDebra K [name]  Procedure(s) Performed: TOTAL SHOULDER ARTHROPLASTY (Right Shoulder)     Patient location during evaluation: PACU Anesthesia Type: General Level of consciousness: awake and alert Pain management: pain level controlled Vital Signs Assessment: post-procedure vital signs reviewed and stable Respiratory status: spontaneous breathing, nonlabored ventilation, respiratory function stable and patient connected to nasal cannula oxygen Cardiovascular status: blood pressure returned to baseline and stable Postop Assessment: no apparent nausea or vomiting Anesthetic complications: no    Last Vitals:  Vitals:   04/15/18 1527 04/15/18 1758  BP:  (!) 137/96  Pulse: (!) 106 99  Resp:  18  Temp:  36.6 C  SpO2: 96% 97%    Last Pain:  Vitals:   04/15/18 1758  TempSrc: Oral  PainSc: 0-No pain                 Cathlene Gardella DAVID

## 2018-04-15 NOTE — Transfer of Care (Signed)
Immediate Anesthesia Transfer of Care Note  Patient: Kaitlyn [name]  Procedure(s) Performed: TOTAL SHOULDER ARTHROPLASTY (Right Shoulder)  Patient Location: PACU  Anesthesia Type:General and Regional  Level of Consciousness: awake, alert , oriented and patient cooperative  Airway & Oxygen Therapy: Patient Spontanous Breathing and Patient connected to face mask oxygen  Post-op Assessment: Report given to RN and Post -op Vital signs reviewed and stable spo2 upon arrival to pacu reading 68% on nasal cannula  Patient drowsy but breathing spontaneously and can answer questions.  Nasal cannula exchanged for o2 mask; o2 sats 93%  Post vital signs: Reviewed and stable  Last Vitals:  Vitals Value Taken Time  BP 128/85 04/15/2018 12:14 PM  Temp    Pulse 106 04/15/2018 12:15 PM  Resp 32 04/15/2018 12:15 PM  SpO2 93 % 04/15/2018 12:15 PM  Vitals shown include unvalidated device data.  Last Pain:  Vitals:   04/15/18 0925  TempSrc:   PainSc: 0-No pain      Patients Stated Pain Goal: 3 (04/15/18 0752)  Complications: No apparent anesthesia complications

## 2018-04-15 NOTE — Anesthesia Procedure Notes (Signed)
Anesthesia Regional Block: Interscalene brachial plexus block   Pre-Anesthetic Checklist: ,, timeout performed, Correct Patient, Correct Site, Correct Laterality, Correct Procedure, Correct Position, site marked, Risks and benefits discussed,  Surgical consent,  Pre-op evaluation,  At surgeon's request and post-op pain management  Laterality: Right  Prep: chloraprep       Needles:  Injection technique: Single-shot  Needle Type: Other     Needle Length: 9cm  Needle Gauge: 21   Needle insertion depth: 5 cm   Additional Needles:   Procedures:, nerve stimulator,,,,,,,  Narrative:  Start time: 04/15/2018 8:33 AM End time: 04/15/2018 8:43 AM Injection made incrementally with aspirations every 5 mL.  Performed by: Personally  Anesthesiologist: Arta Brucessey, Shantara Goosby, MD  Additional Notes: Monitors applied. Patient sedated. Sterile prep and drape,hand hygiene and sterile gloves were used. Needle position confirmed with evoked response at 0.4 mV.Local anesthetic injected incrementally after negative aspiration.Vascular puncture avoided. No complications. The patient tolerated the procedure well.

## 2018-04-15 NOTE — Discharge Instructions (Signed)

## 2018-04-15 NOTE — Progress Notes (Signed)
Patient's HR sustaining 116-118.  Patient denies being in pain after nerve block.  Dr. Michelle Piperssey aware.  Will continue to monitor patient.

## 2018-04-15 NOTE — Plan of Care (Signed)

## 2018-04-15 NOTE — Anesthesia Procedure Notes (Signed)
Procedure Name: Intubation Date/Time: 04/15/2018 10:35 AM Performed by: Shirlyn Goltz, CRNA Pre-anesthesia Checklist: Patient identified, Emergency Drugs available, Suction available and Patient being monitored Patient Re-evaluated:Patient Re-evaluated prior to induction Oxygen Delivery Method: Circle system utilized Preoxygenation: Pre-oxygenation with 100% oxygen Induction Type: IV induction Ventilation: Mask ventilation without difficulty Laryngoscope Size: Mac and 3 Grade View: Grade I Tube type: Oral Tube size: 7.0 mm Number of attempts: 1 Airway Equipment and Method: Stylet Placement Confirmation: ETT inserted through vocal cords under direct vision,  positive ETCO2 and breath sounds checked- equal and bilateral Secured at: 20 cm Tube secured with: Tape Dental Injury: Teeth and Oropharynx as per pre-operative assessment

## 2018-04-16 ENCOUNTER — Encounter (HOSPITAL_COMMUNITY): Payer: Self-pay | Admitting: Orthopedic Surgery

## 2018-04-16 LAB — BASIC METABOLIC PANEL
Anion gap: 8 (ref 5–15)
BUN: 14 mg/dL (ref 8–23)
CHLORIDE: 107 mmol/L (ref 98–111)
CO2: 25 mmol/L (ref 22–32)
CREATININE: 1.02 mg/dL — AB (ref 0.44–1.00)
Calcium: 8.6 mg/dL — ABNORMAL LOW (ref 8.9–10.3)
GFR calc non Af Amer: 58 mL/min — ABNORMAL LOW (ref >60)
Glucose, Bld: 107 mg/dL — ABNORMAL HIGH (ref 70–99)
POTASSIUM: 5 mmol/L (ref 3.5–5.1)
SODIUM: 140 mmol/L (ref 135–145)

## 2018-04-16 LAB — CBC
HCT: 28.8 % — ABNORMAL LOW (ref 36.0–46.0)
HEMOGLOBIN: 8.2 g/dL — AB (ref 12.0–15.0)
MCH: 26.2 pg (ref 26.0–34.0)
MCHC: 28.5 g/dL — ABNORMAL LOW (ref 30.0–36.0)
MCV: 92 fL (ref 78.0–100.0)
Platelets: 381 10*3/uL (ref 150–400)
RBC: 3.13 MIL/uL — AB (ref 3.87–5.11)
RDW: 16.5 % — ABNORMAL HIGH (ref 11.5–15.5)
WBC: 13.2 10*3/uL — ABNORMAL HIGH (ref 4.0–10.5)

## 2018-04-16 NOTE — Progress Notes (Signed)
Patient o2 level in the lower 90's on 0.5lpm via nasal cannula. O2 sat drops to mid to upper 80's with activity. Patient encouraged to use incentive spirometer. Denies SOB. No resp distress noted. No use of accessory muscles. Lung sounds diminished to bil base. Notified PA and she stated to continue to monitor patient and will follow up with nurse later today before patient discharges home.

## 2018-04-16 NOTE — Progress Notes (Signed)
Occupational Therapy Treatment Patient Details Name: Kaitlyn [name] MRN: [id number] DOB: 17-Jan-1956 Today's Date: 04/16/2018    History of present illness Pt is a 62 y/o female now s/p R TSA. PMHx includes depression, GERD urinary incontinence, hx of bil THA in 10/2017   OT comments  Pt seen for additional treatment session with spouse present for additional education regarding shoulder protocol. Educated and further reviewed with pt/pt's spouse shoulder precautions including sling management, compensatory strategies for ADL completion, and HEP with pt and pt's spouse verbalizing understanding. Pt's spouse reports feeling comfortable providing necessary assist for ADLs after return home. Questions answered throughout. Feel pt is safe to return home from OT standpoint once medically ready given available spouse assist. Pt anticipating d/c home later today.    Follow Up Recommendations  Follow surgeon's recommendation for DC plan and follow-up therapies;Supervision/Assistance - 24 hour    Equipment Recommendations  None recommended by OT          Precautions / Restrictions Precautions Precautions: Shoulder Type of Shoulder Precautions: sling at all times except ADL/exercise; NWB operative UE; okay for AROM to e/w/h to tolerance; no pendulums, no abduction; okay for PROM FF 0-90 and ER to neutral, NO AROM to shoulder  Shoulder Interventions: Shoulder sling/immobilizer;At all times;Off for dressing/bathing/exercises Precaution Booklet Issued: Yes (comment) Precaution Comments: issued and reviewed with pt  Required Braces or Orthoses: Sling Restrictions Weight Bearing Restrictions: Yes RUE Weight Bearing: Non weight bearing       Mobility Bed Mobility Overal bed mobility: Needs Assistance Bed Mobility: Supine to Sit     Supine to sit: Supervision     General bed mobility comments: OOB in recliner   Transfers Overall transfer level: Needs assistance   Transfers: Sit to/from  Stand Sit to Stand: Supervision;Min guard         General transfer comment: for general safety, no physical assist required     Balance Overall balance assessment: Mild deficits observed, not formally tested;Needs assistance Sitting-balance support: Feet supported Sitting balance-Leahy Scale: Good     Standing balance support: No upper extremity supported;During functional activity Standing balance-Leahy Scale: Fair Standing balance comment: minguard for dynamic balance                            ADL either performed or assessed with clinical judgement   ADL Overall ADL's : Needs assistance/impaired Eating/Feeding: Set up;Sitting   Grooming: Set up;Sitting   Upper Body Bathing: Minimal assistance;Sitting   Lower Body Bathing: Sit to/from stand;Minimal assistance   Upper Body Dressing : Sitting;Maximal assistance Upper Body Dressing Details (indicate cue type and reason): verbally reviewed doffing/donning sling with spouse and pt; both verbalizing understanding  Lower Body Dressing: Moderate assistance;Sit to/from stand Lower Body Dressing Details (indicate cue type and reason): assist to thread LEs into underwear/pants (pt reports she typically uses reacher for completing at home); assist to advance over hips on R side  Toilet Transfer: Min guard;Ambulation;Regular Teacher, adult education Details (indicate cue type and reason): simulated in transfer to recliner  Toileting- Clothing Manipulation and Hygiene: Minimal assistance;Sit to/from stand       Functional mobility during ADLs: Min guard General ADL Comments: pt seen for additional session for further education with spouse present regarding shoulder precautions. Educated and further reviewed with pt/pt's spouse regarding compensatory strategies for ADLs including sling management, reviewed shoulder protocol and HEP      Vision       Perception  Praxis      Cognition Arousal/Alertness:  Awake/alert Behavior During Therapy: WFL for tasks assessed/performed Overall Cognitive Status: Within Functional Limits for tasks assessed                                          Exercises Shoulder Exercises Shoulder Flexion: PROM;5 reps;Right;Supine Shoulder External Rotation: PROM;5 reps;Seated;Right;Limitations Shoulder External Rotation Limitations: to neutral Elbow Flexion: AROM;AAROM;Right;10 reps;Seated Elbow Extension: AROM;AAROM;10 reps;Right;Seated Wrist Flexion: AROM;10 reps;Right;Seated Wrist Extension: AROM;10 reps;Right;Seated Digit Composite Flexion: AROM;10 reps;Right;Seated Composite Extension: AROM;10 reps;Right;Seated Neck Flexion: AROM;Seated Neck Extension: AROM;Seated Neck Lateral Flexion - Right: AROM;Seated Neck Lateral Flexion - Left: AROM;Seated Hand Exercises Forearm Supination: AROM;10 reps;Seated;Right Forearm Pronation: AROM;10 reps;Seated;Right   Shoulder Instructions Shoulder Instructions Donning/doffing shirt without moving shoulder: Maximal assistance;Caregiver independent with task;Patient able to independently direct caregiver Method for sponge bathing under operated UE: Minimal assistance;Caregiver independent with task;Patient able to independently direct caregiver Donning/doffing sling/immobilizer: Maximal assistance;Caregiver independent with task;Patient able to independently direct caregiver Correct positioning of sling/immobilizer: Moderate assistance ROM for elbow, wrist and digits of operated UE: Min-guard Sling wearing schedule (on at all times/off for ADL's): Independent Proper positioning of operated UE when showering: Supervision/safety Positioning of UE while sleeping: Minimal assistance;Caregiver independent with task;Patient able to independently direct caregiver     General Comments SpO2 dropped to 84% on RA after completing hallway level functional mobility - increased to above 90% within approx 30 sec with  deep breathing and seated rest breaks     Pertinent Vitals/ Pain       Pain Assessment: Faces Faces Pain Scale: Hurts little more Pain Location: R shoulder  Pain Descriptors / Indicators: Sore;Guarding Pain Intervention(s): Monitored during session;Repositioned;Ice applied  Home Living Family/patient expects to be discharged to:: Private residence Living Arrangements: Spouse/significant other Available Help at Discharge: Family;Available 24 hours/day Type of Home: House Home Access: Stairs to enter Entergy CorporationEntrance Stairs-Number of Steps: 4 Entrance Stairs-Rails: Right Home Layout: One level     Bathroom Shower/Tub: Producer, television/film/videoWalk-in shower   Bathroom Toilet: Standard     Home Equipment: Shower seat          Prior Functioning/Environment Level of Independence: Independent with assistive device(s)        Comments: ambulates without AD; uses AE for LB dressing due to recent hip surgery in 10/2017   Frequency  Min 3X/week        Progress Toward Goals  OT Goals(current goals can now be found in the care plan section)  Progress towards OT goals: Progressing toward goals  Acute Rehab OT Goals Patient Stated Goal: return home  OT Goal Formulation: With patient Time For Goal Achievement: 04/30/18 Potential to Achieve Goals: Good ADL Goals Pt Will Perform Upper Body Dressing: with mod assist;with caregiver independent in assisting;sitting Pt Will Perform Lower Body Dressing: with mod assist;with caregiver independent in assisting;sit to/from stand Pt/caregiver will Perform Home Exercise Program: Right Upper extremity;With Supervision;With written HEP provided  Plan Discharge plan remains appropriate    Co-evaluation                 AM-PAC PT "6 Clicks" Daily Activity     Outcome Measure   Help from another person eating meals?: None Help from another person taking care of personal grooming?: A Little Help from another person toileting, which includes using toliet,  bedpan, or urinal?: A Little Help from another person bathing (including  washing, rinsing, drying)?: A Little Help from another person to put on and taking off regular upper body clothing?: A Lot Help from another person to put on and taking off regular lower body clothing?: A Lot 6 Click Score: 17    End of Session Equipment Utilized During Treatment: Other (comment)(sling )  OT Visit Diagnosis: Muscle weakness (generalized) (M62.81);Other (comment)(s/p R TSA )   Activity Tolerance Patient tolerated treatment well   Patient Left in chair;with call bell/phone within reach   Nurse Communication Mobility status        Time: 1610-9604 OT Time Calculation (min): 16 min  Charges: OT General Charges $OT Visit: 1 Visit OT Treatments $Self Care/Home Management : 8-22 mins  Marcy Siren, OT Pager [phone] 04/16/2018   Orlando Penner 04/16/2018, 11:19 AM

## 2018-04-16 NOTE — Plan of Care (Signed)

## 2018-04-16 NOTE — Discharge Summary (Signed)
Patient ID: Kaitlyn AsaDebra K Ang MRN: [id number] DOB/AGE: 1955-10-03 62 y.o.  Admit date: 04/15/2018 Discharge date: 04/16/2018  Admission Diagnoses:  Active Problems:   Status post total shoulder arthroplasty, right   Discharge Diagnoses:  Same  Past Medical History:  Diagnosis Date  . Arthritis    oa  . Depression   . Frequency of urination   . GERD (gastroesophageal reflux disease)    CONTROLLED W/ PRILOSEC  . Headache    hx of migraines  . Hyperlipemia   . Hypothyroidism   . Incontinence of urine   . Insomnia   . Interstitial cystitis   . Pelvic pain syndrome I.C.  . PONV (postoperative nausea and vomiting)   . Urgency of urination   . Wears dentures    UPPER  . Wears glasses     Surgeries: Procedure(s): TOTAL SHOULDER ARTHROPLASTY on 04/15/2018   Consultants:   Discharged Condition: Improved  Hospital Course: Kaitlyn [name] is an 62 y.o. female who was admitted 04/15/2018 for operative treatment of end stage R shoulder OA. Patient has severe unremitting pain that affects sleep, daily activities, and work/hobbies. After pre-op clearance the patient was taken to the operating room on 04/15/2018 and underwent  Procedure(s): TOTAL SHOULDER ARTHROPLASTY.    Patient was given perioperative antibiotics:  Anti-infectives (From admission, onward)   Start     Dose/Rate Route Frequency Ordered Stop   04/15/18 1600  ceFAZolin (ANCEF) IVPB 2g/100 mL premix     2 g 200 mL/hr over 30 Minutes Intravenous Every 6 hours 04/15/18 1342 04/15/18 2251   04/15/18 0730  ceFAZolin (ANCEF) IVPB 2g/100 mL premix     2 g 200 mL/hr over 30 Minutes Intravenous On call to O.R. 04/15/18 0728 04/15/18 1037       Patient was given sequential compression devices, early ambulation, and chemoprophylaxis to prevent DVT.  Patient benefited maximally from hospital stay and there were no complications.    Recent vital signs:  Patient Vitals for the past 24 hrs:  BP Temp Temp src Pulse Resp SpO2 Height  Weight  04/16/18 1149 (!) 151/93 98.7 F (37.1 C) Oral 99 - 92 % - -  04/16/18 0935 (!) 147/99 98.5 F (36.9 C) Oral (!) 102 - 95 % - -  04/16/18 0557 - - - - - - 4\' 10"  (1.473 m) 73.9 kg (163 lb)  04/16/18 0503 101/89 (!) 97.5 F (36.4 C) Oral 86 16 97 % - -  04/16/18 0051 110/73 98 F (36.7 C) Oral 100 16 91 % - -  04/15/18 1937 (!) 142/100 98.1 F (36.7 C) Oral (!) 102 16 94 % - -  04/15/18 1758 (!) 137/96 97.8 F (36.6 C) Oral 99 18 97 % - -  04/15/18 1527 - - - (!) 106 - 96 % - -  04/15/18 1524 122/89 98.8 F (37.1 C) - (!) 103 - 94 % - -  04/15/18 1344 110/83 98.4 F (36.9 C) Oral 80 - 94 % - -  04/15/18 1315 - (!) 97 F (36.1 C) - 100 19 95 % - -  04/15/18 1307 - - - (!) 102 20 94 % - -  04/15/18 1303 (!) 114/95 - - - - - - -  04/15/18 1251 101/63 - - - - - - -  04/15/18 1250 - - - (!) 102 19 93 % - -  04/15/18 1245 - - - (!) 104 18 96 % - -  04/15/18 1235 122/78 - - - - - - -  04/15/18 1224 - - - (!) 102 20 95 % - -  04/15/18 1219 117/76 - - - - - - -  04/15/18 1215 128/85 98.1 F (36.7 C) - (!) 106 19 93 % - -     Recent laboratory studies:  Recent Labs    04/16/18 0417  WBC 13.2*  HGB 8.2*  HCT 28.8*  PLT 381  NA 140  K 5.0  CL 107  CO2 25  BUN 14  CREATININE 1.02*  GLUCOSE 107*  CALCIUM 8.6*     Discharge Medications:   Allergies as of 04/16/2018      Reactions   Aspirin Other (See Comments)   Gi upset   Sulfa Antibiotics Rash   Trilafon [perphenazine] Rash   TRILA      Medication List    TAKE these medications   divalproex 500 MG DR tablet Commonly known as:  DEPAKOTE Take 500 mg by mouth at bedtime.   escitalopram 20 MG tablet Commonly known as:  LEXAPRO Take 20 mg by mouth at bedtime.   gabapentin 300 MG capsule Commonly known as:  NEURONTIN Take 300 mg by mouth at bedtime. at bedtime.   HYDROcodone-acetaminophen 5-325 MG tablet Commonly known as:  NORCO Take 1-2 tablets by mouth every 4 (four) hours as needed for moderate  pain.   levothyroxine 88 MCG tablet Commonly known as:  SYNTHROID, LEVOTHROID Take 88 mcg by mouth daily before breakfast.   Melatonin 10 MG Tabs Take 10 mg by mouth at bedtime.   omeprazole 20 MG capsule Commonly known as:  PRILOSEC Take 20 mg by mouth every morning.   QUEtiapine 200 MG tablet Commonly known as:  SEROQUEL Take 200 mg by mouth at bedtime.   rizatriptan 10 MG tablet Commonly known as:  MAXALT Take 10 mg by mouth 2 (two) times daily as needed. For migraine headaches.       Diagnostic Studies: Dg Shoulder Right Port  Result Date: 04/15/2018 CLINICAL DATA:  Followup right shoulder arthroplasty. EXAM: PORTABLE RIGHT SHOULDER COMPARISON:  MRI 01/22/2018 FINDINGS: Right shoulder arthroplasty. Components appear well positioned. No radiographically detectable complication. IMPRESSION: Good appearance following right shoulder arthroplasty. Electronically Signed   By: Paulina Fusi M.D.   On: 04/15/2018 13:16    Disposition: Discharge disposition: 01-Home or Self Care       Discharge Instructions    Call MD / Call 911   Complete by:  As directed    If you experience chest pain or shortness of breath, CALL 911 and be transported to the hospital emergency room.  If you develope a fever above 101 F, pus (white drainage) or increased drainage or redness at the wound, or calf pain, call your surgeon's office.   Constipation Prevention   Complete by:  As directed    Drink plenty of fluids.  Prune juice may be helpful.  You may use a stool softener, such as Colace (over the counter) 100 mg twice a day.  Use MiraLax (over the counter) for constipation as needed.   Diet - low sodium heart healthy   Complete by:  As directed    Increase activity slowly as tolerated   Complete by:  As directed       Follow-up Information    Jackquline Bosch, MD. Schedule an appointment as soon as possible for a visit in 2 week(s).   Contact information: [address] [place]  [address] [phone]            Signed: Caleen Jobs  Kaitlyn [name] 04/16/2018, 12:08 PM

## 2018-04-16 NOTE — Progress Notes (Signed)
   PATIENT ID: Kaitlyn [name]   1 Day Post-Op Procedure(s) (LRB): TOTAL SHOULDER ARTHROPLASTY (Right)  Subjective: Doing well, no pain. Comfortable in sling. Denies lightheadedness/sob. Feels ready to go home today  Objective:  Vitals:   04/16/18 0051 04/16/18 0503  BP: 110/73 101/89  Pulse: 100 86  Resp: 16 16  Temp: 98 F (36.7 C) (!) 97.5 F (36.4 C)  SpO2: 91% 97%     R UE dressing c/d/i Wiggles fingers, distally NVI  Labs:  Recent Labs    04/16/18 0417  HGB 8.2*   Recent Labs    04/16/18 0417  WBC 13.2*  RBC 3.13*  HCT 28.8*  PLT 381   Recent Labs    04/16/18 0417  NA 140  K 5.0  CL 107  CO2 25  BUN 14  CREATININE 1.02*  GLUCOSE 107*  CALCIUM 8.6*    Assessment and Plan: S/p R TSA Home today when cleared by OT PROM goal to 90 FF 0 ER w OT Scripts in chart Sling Fu with Dr. Ave Filterhandler in 2 weeks  VTE proph: asa, scds

## 2018-04-16 NOTE — Progress Notes (Signed)
Patient discharging home today. o2 removed for an hour and patient maintained o2 sats in mid 90's. Denies SOB or resp distress. No use of accessory muscles. Patient able to maintain 02 sat above 90 with activity. PA notified.  Discharge instructions explained to patient and she verbalized understanding. No c/o pain at this time. Took all personal belongings. No further questions or concerns voiced.

## 2018-04-16 NOTE — Evaluation (Signed)
Occupational Therapy Evaluation Patient Details Name: Kaitlyn AsaDebra K [name] MRN: [id number] DOB: 1956/09/06 Today's Date: 04/16/2018    History of Present Illness Pt is a 62 y/o female now s/p R TSA. PMHx includes depression, GERD urinary incontinence, hx of bil THA in 10/2017   Clinical Impression   This 62 y/o female presents with the above. At baseline pt reports independence with functional mobility and mod independence with ADLs using AE. Pt demonstrates functional mobility this session without AD and overall minguard assist. Pt on RA during session, one instance of SpO2 decreasing to 84% after hallway level mobility, rebounding to above 90% with seated rest break and deep breathing. Pt currently requires mod-maxA for UB and LB ADLs secondary to RUE functional limitations. Educated pt on shoulder precautions, safety and compensatory strategies for ADL completion while maintaining precautions, pt verbalizing and demonstrates understanding with intermittent cues throughout. Pt reports spouse will be available to provide necessary ADL assist at time of discharge; will benefit from follow up treatment session prior to discharge with spouse present to reinforce education of shoulder protocol. Will follow.     Follow Up Recommendations  Follow surgeon's recommendation for DC plan and follow-up therapies;Supervision/Assistance - 24 hour    Equipment Recommendations  None recommended by OT           Precautions / Restrictions Precautions Precautions: Shoulder Type of Shoulder Precautions: sling at all times except ADL/exercise; NWB operative UE; okay for AROM to e/w/h to tolerance; no pendulums, no abduction; okay for PROM FF 0-90 and ER to neutral, NO AROM to shoulder  Shoulder Interventions: Shoulder sling/immobilizer;At all times;Off for dressing/bathing/exercises Precaution Booklet Issued: Yes (comment) Precaution Comments: issued and reviewed with pt  Required Braces or Orthoses:  Sling Restrictions Weight Bearing Restrictions: Yes RUE Weight Bearing: Non weight bearing      Mobility Bed Mobility Overal bed mobility: Needs Assistance Bed Mobility: Supine to Sit     Supine to sit: Supervision     General bed mobility comments: use of bed rail for supine>sit on L side of bed; pt reports has bedrail at home for bed mobility; supervision for safety   Transfers Overall transfer level: Needs assistance   Transfers: Sit to/from Stand Sit to Stand: Supervision;Min guard         General transfer comment: for general safety, no physical assist required     Balance Overall balance assessment: Mild deficits observed, not formally tested;Needs assistance Sitting-balance support: Feet supported Sitting balance-Leahy Scale: Good     Standing balance support: No upper extremity supported;During functional activity Standing balance-Leahy Scale: Fair Standing balance comment: minguard for dynamic balance                            ADL either performed or assessed with clinical judgement   ADL Overall ADL's : Needs assistance/impaired Eating/Feeding: Set up;Sitting   Grooming: Set up;Sitting   Upper Body Bathing: Minimal assistance;Sitting   Lower Body Bathing: Sit to/from stand;Minimal assistance   Upper Body Dressing : Sitting;Maximal assistance Upper Body Dressing Details (indicate cue type and reason): donning bra and button up shirt, doffing/donning sling  Lower Body Dressing: Moderate assistance;Sit to/from stand Lower Body Dressing Details (indicate cue type and reason): assist to thread LEs into underwear/pants (pt reports she typically uses reacher for completing at home); assist to advance over hips on R side  Toilet Transfer: Min guard;Ambulation;Regular Teacher, adult educationToilet Toilet Transfer Details (indicate cue type and reason): simulated in transfer  to recliner  Toileting- Clothing Manipulation and Hygiene: Minimal assistance;Sit to/from stand        Functional mobility during ADLs: Min guard General ADL Comments: educated pt on general shoulder precautions, safety and compensatory strategies for ADL completion while maintaining precautions; pt verbalizing and demonstrates understanding, will benefit from follow up with spouse present to reinforce      Vision         Perception     Praxis      Pertinent Vitals/Pain Pain Assessment: No/denies pain(still feeling some numbness s/p surgery )     Hand Dominance Left   Extremity/Trunk Assessment Upper Extremity Assessment Upper Extremity Assessment: RUE deficits/detail;LUE deficits/detail RUE Deficits / Details: s/p R TSA; pt reports residual numbness from surgery, able to move digits without difficulty  RUE: Unable to fully assess due to pain;Unable to fully assess due to immobilization RUE Sensation: decreased light touch LUE Deficits / Details: generalized weakness and pt reports will need to have L shoulder surgery at some point in the future, appears [facility] for functional tasks    Lower Extremity Assessment Lower Extremity Assessment: Overall WFL for tasks assessed   Cervical / Trunk Assessment Cervical / Trunk Assessment: Normal   Communication Communication Communication: No difficulties   Cognition Arousal/Alertness: Awake/alert Behavior During Therapy: WFL for tasks assessed/performed Overall Cognitive Status: Within Functional Limits for tasks assessed                                     General Comments  SpO2 dropped to 84% on RA after completing hallway level functional mobility - increased to above 90% within approx 30 sec with deep breathing and seated rest breaks     Exercises Shoulder Exercises Shoulder Flexion: PROM;5 reps;Right;Supine Shoulder External Rotation: PROM;5 reps;Seated;Right;Limitations Shoulder External Rotation Limitations: to neutral Elbow Flexion: AROM;AAROM;Right;10 reps;Seated Elbow Extension: AROM;AAROM;10  reps;Right;Seated Wrist Flexion: AROM;10 reps;Right;Seated Wrist Extension: AROM;10 reps;Right;Seated Digit Composite Flexion: AROM;10 reps;Right;Seated Composite Extension: AROM;10 reps;Right;Seated Neck Flexion: AROM;Seated Neck Extension: AROM;Seated Neck Lateral Flexion - Right: AROM;Seated Neck Lateral Flexion - Left: AROM;Seated Hand Exercises Forearm Supination: AROM;10 reps;Seated;Right Forearm Pronation: AROM;10 reps;Seated;Right   Shoulder Instructions Shoulder Instructions Donning/doffing shirt without moving shoulder: Maximal assistance Method for sponge bathing under operated UE: Minimal assistance Donning/doffing sling/immobilizer: Maximal assistance Correct positioning of sling/immobilizer: Moderate assistance ROM for elbow, wrist and digits of operated UE: Minimal assistance Sling wearing schedule (on at all times/off for ADL's): Independent Proper positioning of operated UE when showering: Supervision/safety Positioning of UE while sleeping: Minimal assistance    Home Living Family/patient expects to be discharged to:: Private residence Living Arrangements: Spouse/significant other Available Help at Discharge: Family;Available 24 hours/day Type of Home: House Home Access: Stairs to enter [employer] of Steps: 4 Entrance Stairs-Rails: Right Home Layout: One level     Bathroom Shower/Tub: Producer, television/film/video: Standard     Home Equipment: Shower seat          Prior Functioning/Environment Level of Independence: Independent with assistive device(s)        Comments: ambulates without AD; uses AE for LB dressing due to recent hip surgery in 10/2017        OT Problem List: Decreased strength;Impaired balance (sitting and/or standing);Impaired UE functional use;Decreased activity tolerance;Decreased knowledge of precautions      OT Treatment/Interventions: Self-care/ADL training;DME and/or AE instruction;Therapeutic  activities;Therapeutic exercise;Balance training;Patient/family education    OT Goals(Current goals  can be found in the care plan section) Acute Rehab OT Goals Patient Stated Goal: return home  OT Goal Formulation: With patient Time For Goal Achievement: 04/30/18 Potential to Achieve Goals: Good  OT Frequency: Min 3X/week   Barriers to D/C:            Co-evaluation              AM-PAC PT "6 Clicks" Daily Activity     Outcome Measure Help from another person eating meals?: None Help from another person taking care of personal grooming?: A Little Help from another person toileting, which includes using toliet, bedpan, or urinal?: A Little Help from another person bathing (including washing, rinsing, drying)?: A Little Help from another person to put on and taking off regular upper body clothing?: A Lot Help from another person to put on and taking off regular lower body clothing?: A Lot 6 Click Score: 17   End of Session Equipment Utilized During Treatment: Gait belt;Other (comment)(sling ) Nurse Communication: Mobility status  Activity Tolerance: Patient tolerated treatment well Patient left: in chair;with call bell/phone within reach  OT Visit Diagnosis: Muscle weakness (generalized) (M62.81);Other (comment)(s/p R TSA )                Time: 0821-0903 OT Time Calculation (min): 42 min Charges:  OT General Charges $OT Visit: 1 Visit OT Treatments $Self Care/Home Management : 23-37 mins  Marcy Siren, OT Pager [phone] 04/16/2018   Orlando Penner 04/16/2018, 9:26 AM

## 2018-07-23 ENCOUNTER — Other Ambulatory Visit: Payer: Self-pay | Admitting: Orthopedic Surgery

## 2018-07-28 NOTE — Pre-Procedure Instructions (Signed)
Evelena AsaDebra K Akers  07/28/2018      [id number] [place] - [address] [facility] [address] [place] [address] [place] [address] Phone: [phone] Fax: [phone]    Your procedure is scheduled on November 21st.  [facility] at 0800 A.M.  Call this number if you have problems the morning of surgery:  [phone]   Remember:  Do not eat or drink after midnight.    Take these medicines the morning of surgery with A SIP OF WATER   HYDROcodone-acetaminophen (NORCO) if needed  levothyroxine (SYNTHROID, LEVOTHROID)  omeprazole (PRILOSEC)  7 days prior to surgery STOP taking any Aspirin(unless otherwise instructed by your surgeon), Aleve, Naproxen, Ibuprofen, Motrin, Advil, Goody's, BC's, all herbal medications, fish oil, and all vitamins     Do not wear jewelry, make-up or nail polish.  Do not wear lotions, powders, or perfumes, or deodorant.  Do not shave 48 hours prior to surgery.  Men may shave face and neck.  Do not bring valuables to the hospital.  [facility] is not responsible for any belongings or valuables.  Contacts, dentures or bridgework may not be worn into surgery.  Leave your suitcase in the car.  After surgery it may be brought to your room.  For patients admitted to the hospital, discharge time will be determined by your treatment team.  Patients discharged the day of surgery will not be allowed to drive home.    - Preparing For Surgery  Before surgery, you can play an important role. Because skin is not sterile, your skin needs to be as free of germs as possible. You can reduce the number of germs on your skin by washing with CHG (chlorahexidine gluconate) Soap before surgery.  CHG is an antiseptic cleaner which kills germs and bonds with the skin to continue killing germs even after washing.    Oral Hygiene is also important to reduce your risk of infection.  Remember - BRUSH YOUR TEETH  THE MORNING OF SURGERY WITH YOUR REGULAR TOOTHPASTE  Please do not use if you have an allergy to CHG or antibacterial soaps. If your skin becomes reddened/irritated stop using the CHG.  Do not shave (including legs and underarms) for at least 48 hours prior to first CHG shower. It is OK to shave your face.  Please follow these instructions carefully.   1. Shower the NIGHT BEFORE SURGERY and the MORNING OF SURGERY with CHG.   2. If you chose to wash your hair, wash your hair first as usual with your normal shampoo.  3. After you shampoo, rinse your hair and body thoroughly to remove the shampoo.  4. Use CHG as you would any other liquid soap. You can apply CHG directly to the skin and wash gently with a scrungie or a clean washcloth.   5. Apply the CHG Soap to your body ONLY FROM THE NECK DOWN.  Do not use on open wounds or open sores. Avoid contact with your eyes, ears, mouth and genitals (private parts). Wash Face and genitals (private parts)  with your normal soap.  6. Wash thoroughly, paying special attention to the area where your surgery will be performed.  7. Thoroughly rinse your body with warm water from the neck down.  8. DO NOT shower/wash with your normal soap after using and rinsing off the CHG Soap.  9. Pat yourself dry with a CLEAN TOWEL.  10. Wear CLEAN PAJAMAS to bed  the night before surgery, wear comfortable clothes the morning of surgery  11. Place CLEAN SHEETS on your bed the night of your first shower and DO NOT SLEEP WITH PETS.    Day of Surgery:  Do not apply any deodorants/lotions.  Please wear clean clothes to the hospital/surgery center.   Remember to brush your teeth WITH YOUR REGULAR TOOTHPASTE.    Please read over the following fact sheets that you were given.

## 2018-07-29 ENCOUNTER — Other Ambulatory Visit: Payer: Self-pay | Admitting: Orthopedic Surgery

## 2018-07-29 ENCOUNTER — Encounter (HOSPITAL_COMMUNITY)
Admission: RE | Admit: 2018-07-29 | Discharge: 2018-07-29 | Disposition: A | Discharge status: Home / Self Care | Payer: BLUE CROSS/BLUE SHIELD | Source: Ambulatory Visit | Attending: Orthopedic Surgery | Admitting: Orthopedic Surgery

## 2018-07-29 ENCOUNTER — Other Ambulatory Visit: Payer: Self-pay

## 2018-07-29 ENCOUNTER — Encounter (HOSPITAL_COMMUNITY): Payer: Self-pay

## 2018-07-29 DIAGNOSIS — Z01812 Encounter for preprocedural laboratory examination: Secondary | ICD-10-CM | POA: Diagnosis not present

## 2018-07-29 LAB — CBC WITH DIFFERENTIAL/PLATELET
Abs Immature Granulocytes: 0.28 10*3/uL — ABNORMAL HIGH (ref 0.00–0.07)
BASOS ABS: 0.1 10*3/uL (ref 0.0–0.1)
Basophils Relative: 1 %
EOS ABS: 0.3 10*3/uL (ref 0.0–0.5)
Eosinophils Relative: 2 %
HEMATOCRIT: 34 % — AB (ref 36.0–46.0)
Hemoglobin: 9.1 g/dL — ABNORMAL LOW (ref 12.0–15.0)
IMMATURE GRANULOCYTES: 2 %
LYMPHS ABS: 3.4 10*3/uL (ref 0.7–4.0)
LYMPHS PCT: 25 %
MCH: 22.5 pg — ABNORMAL LOW (ref 26.0–34.0)
MCHC: 26.8 g/dL — AB (ref 30.0–36.0)
MCV: 84 fL (ref 80.0–100.0)
MONOS PCT: 5 %
Monocytes Absolute: 0.7 10*3/uL (ref 0.1–1.0)
NEUTROS ABS: 9.1 10*3/uL — AB (ref 1.7–7.7)
NEUTROS PCT: 65 %
NRBC: 0.1 % (ref 0.0–0.2)
PLATELETS: 646 10*3/uL — AB (ref 150–400)
RBC: 4.05 MIL/uL (ref 3.87–5.11)
RDW: 17.9 % — AB (ref 11.5–15.5)
WBC: 14 10*3/uL — ABNORMAL HIGH (ref 4.0–10.5)

## 2018-07-29 LAB — SURGICAL PCR SCREEN
MRSA, PCR: NEGATIVE
STAPHYLOCOCCUS AUREUS: POSITIVE — AB

## 2018-07-29 LAB — URINALYSIS, ROUTINE W REFLEX MICROSCOPIC
Bilirubin Urine: NEGATIVE
GLUCOSE, UA: NEGATIVE mg/dL
Ketones, ur: NEGATIVE mg/dL
Nitrite: NEGATIVE
PROTEIN: NEGATIVE mg/dL
Specific Gravity, Urine: 1.013 (ref 1.005–1.030)
pH: 5 (ref 5.0–8.0)

## 2018-07-29 LAB — COMPREHENSIVE METABOLIC PANEL
ALBUMIN: 3 g/dL — AB (ref 3.5–5.0)
ALT: 12 U/L (ref 0–44)
AST: 11 U/L — ABNORMAL LOW (ref 15–41)
Alkaline Phosphatase: 183 U/L — ABNORMAL HIGH (ref 38–126)
Anion gap: 9 (ref 5–15)
BILIRUBIN TOTAL: 0.3 mg/dL (ref 0.3–1.2)
BUN: 16 mg/dL (ref 8–23)
CHLORIDE: 104 mmol/L (ref 98–111)
CO2: 23 mmol/L (ref 22–32)
Calcium: 9.6 mg/dL (ref 8.9–10.3)
Creatinine, Ser: 1.03 mg/dL — ABNORMAL HIGH (ref 0.44–1.00)
GFR calc Af Amer: >60 mL/min (ref >60)
GFR calc non Af Amer: 57 mL/min — ABNORMAL LOW (ref >60)
GLUCOSE: 104 mg/dL — AB (ref 70–99)
POTASSIUM: 4.6 mmol/L (ref 3.5–5.1)
Sodium: 136 mmol/L (ref 135–145)
TOTAL PROTEIN: 7.5 g/dL (ref 6.5–8.1)

## 2018-07-29 LAB — APTT: aPTT: 34 seconds (ref 24–36)

## 2018-07-29 LAB — PROTIME-INR
INR: 1
Prothrombin Time: 13.1 seconds (ref 11.4–15.2)

## 2018-07-29 NOTE — Progress Notes (Addendum)
PCP: Keturah Barreobert Robbins, MD  Cardiologist: pt denies  EKG: 10/28/17 in EPIC  Stress test: pt denies  ECHO: 2013 in EPIC  Cardiac Cath: pt denies  Chest x-ray: 08/28/17 in Care Everywhere

## 2018-08-04 MED ORDER — TRANEXAMIC ACID-NACL 1000-0.7 MG/100ML-% IV SOLN
1000.0000 mg | INTRAVENOUS | Status: AC
  Administered 2018-08-05: 1000 mg via INTRAVENOUS
  Filled 2018-08-04: qty 100

## 2018-08-04 MED ORDER — BUPIVACAINE LIPOSOME 1.3 % IJ SUSP
20.0000 mL | Freq: Once | INTRAMUSCULAR | Status: AC
  Administered 2018-08-05: 10 mL
  Filled 2018-08-04: qty 20

## 2018-08-04 NOTE — Anesthesia Preprocedure Evaluation (Addendum)
Anesthesia Evaluation  Patient identified by MRN, date of birth, ID band Patient awake    Reviewed: Allergy & Precautions, NPO status , Patient's Chart, lab work & pertinent test results  History of Anesthesia Complications (+) PONV  Airway Mallampati: II  TM Distance: >3 FB Neck ROM: Full    Dental no notable dental hx. (+) Upper Dentures, Dental Advisory Given   Pulmonary Current Smoker,    Pulmonary exam normal breath sounds clear to auscultation       Cardiovascular Exercise Tolerance: Good negative cardio ROS Normal cardiovascular exam Rhythm:Regular Rate:Normal  Echo 9/13 Left ventricle: The cavity size was normal. Wall thickness was increased in a pattern of mild LVH. Systolic function was normal. The estimated ejection fraction was in the range of 55% to 65%. Wall motion was normal; there were no regional wall motion abnormalities. Doppler parameters are consistent with abnormal left ventricular relaxation (grade 1 diastolic dysfunction).   Neuro/Psych  Headaches, PSYCHIATRIC DISORDERS Depression    GI/Hepatic Neg liver ROS, GERD  ,  Endo/Other  Hypothyroidism   Renal/GU negative Renal ROS     Musculoskeletal  (+) Arthritis ,   Abdominal (+) + obese,   Peds  Hematology negative hematology ROS (+)   Anesthesia Other Findings   Reproductive/Obstetrics                            This SmartLink has not been configured with any valid records.   Lab Results  Component Value Date   CREATININE 1.03 (H) 07/29/2018   BUN 16 07/29/2018   NA 136 07/29/2018   K 4.6 07/29/2018   CL 104 07/29/2018   CO2 23 07/29/2018    Lab Results  Component Value Date   WBC 14.0 (H) 07/29/2018   HGB 9.1 (L) 07/29/2018   HCT 34.0 (L) 07/29/2018   MCV 84.0 07/29/2018   PLT 646 (H) 07/29/2018    Anesthesia Physical Anesthesia Plan  ASA: II  Anesthesia Plan: General   Post-op Pain  Management:  Regional for Post-op pain   Induction: Intravenous  PONV Risk Score and Plan: 3 and Treatment may vary due to age or medical condition, Dexamethasone and Ondansetron  Airway Management Planned: Oral ETT  Additional Equipment:   Intra-op Plan:   Post-operative Plan: Extubation in OR  Informed Consent: I have reviewed the patients History and Physical, chart, labs and discussed the procedure including the risks, benefits and alternatives for the proposed anesthesia with the patient or authorized representative who has indicated his/her understanding and acceptance.   Dental advisory given  Plan Discussed with: CRNA  Anesthesia Plan Comments: (Plus exparel)        Anesthesia Quick Evaluation

## 2018-08-05 ENCOUNTER — Encounter (HOSPITAL_COMMUNITY): Payer: Self-pay | Admitting: Urology

## 2018-08-05 ENCOUNTER — Inpatient Hospital Stay (HOSPITAL_COMMUNITY): Payer: BLUE CROSS/BLUE SHIELD | Admitting: Anesthesiology

## 2018-08-05 ENCOUNTER — Inpatient Hospital Stay (HOSPITAL_COMMUNITY)
Admission: RE | Admit: 2018-08-05 | Discharge: 2018-08-06 | DRG: 483 | Disposition: A | Discharge status: Home / Self Care | Payer: BLUE CROSS/BLUE SHIELD | Attending: Orthopedic Surgery | Admitting: Orthopedic Surgery

## 2018-08-05 ENCOUNTER — Inpatient Hospital Stay (HOSPITAL_COMMUNITY): Payer: BLUE CROSS/BLUE SHIELD

## 2018-08-05 ENCOUNTER — Encounter (HOSPITAL_COMMUNITY)
Admission: RE | Disposition: A | Discharge status: Home / Self Care | Payer: Self-pay | Source: Home / Self Care | Attending: Orthopedic Surgery

## 2018-08-05 DIAGNOSIS — G43909 Migraine, unspecified, not intractable, without status migrainosus: Secondary | ICD-10-CM | POA: Diagnosis present

## 2018-08-05 DIAGNOSIS — D62 Acute posthemorrhagic anemia: Secondary | ICD-10-CM | POA: Diagnosis not present

## 2018-08-05 DIAGNOSIS — K219 Gastro-esophageal reflux disease without esophagitis: Secondary | ICD-10-CM | POA: Diagnosis present

## 2018-08-05 DIAGNOSIS — Z79890 Hormone replacement therapy (postmenopausal): Secondary | ICD-10-CM

## 2018-08-05 DIAGNOSIS — Z96643 Presence of artificial hip joint, bilateral: Secondary | ICD-10-CM | POA: Diagnosis present

## 2018-08-05 DIAGNOSIS — Z96612 Presence of left artificial shoulder joint: Secondary | ICD-10-CM

## 2018-08-05 DIAGNOSIS — E785 Hyperlipidemia, unspecified: Secondary | ICD-10-CM | POA: Diagnosis present

## 2018-08-05 DIAGNOSIS — F17210 Nicotine dependence, cigarettes, uncomplicated: Secondary | ICD-10-CM | POA: Diagnosis present

## 2018-08-05 DIAGNOSIS — Z96611 Presence of right artificial shoulder joint: Secondary | ICD-10-CM | POA: Diagnosis present

## 2018-08-05 DIAGNOSIS — E039 Hypothyroidism, unspecified: Secondary | ICD-10-CM | POA: Diagnosis present

## 2018-08-05 DIAGNOSIS — F329 Major depressive disorder, single episode, unspecified: Secondary | ICD-10-CM | POA: Diagnosis present

## 2018-08-05 DIAGNOSIS — M87812 Other osteonecrosis, left shoulder: Secondary | ICD-10-CM | POA: Diagnosis present

## 2018-08-05 HISTORY — PX: TOTAL SHOULDER ARTHROPLASTY: SHX126

## 2018-08-05 SURGERY — ARTHROPLASTY, SHOULDER, TOTAL
Anesthesia: General | Site: Shoulder | Laterality: Left

## 2018-08-05 MED ORDER — MIDAZOLAM HCL 2 MG/2ML IJ SOLN
INTRAMUSCULAR | Status: AC
  Filled 2018-08-05: qty 2

## 2018-08-05 MED ORDER — SCOPOLAMINE 1 MG/3DAYS TD PT72
1.0000 | MEDICATED_PATCH | TRANSDERMAL | Status: DC
  Administered 2018-08-05: 1.5 mg via TRANSDERMAL

## 2018-08-05 MED ORDER — FENTANYL CITRATE (PF) 250 MCG/5ML IJ SOLN
INTRAMUSCULAR | Status: AC
  Filled 2018-08-05: qty 5

## 2018-08-05 MED ORDER — ALUM & MAG HYDROXIDE-SIMETH 200-200-20 MG/5ML PO SUSP: 30.0000 mL | ORAL | Status: DC | PRN

## 2018-08-05 MED ORDER — ZOLPIDEM TARTRATE 5 MG PO TABS: 5.0000 mg | ORAL_TABLET | Freq: Every evening | ORAL | Status: DC | PRN

## 2018-08-05 MED ORDER — HYDROMORPHONE HCL 1 MG/ML IJ SOLN
INTRAMUSCULAR | Status: AC
  Filled 2018-08-05: qty 1

## 2018-08-05 MED ORDER — MIDAZOLAM HCL 2 MG/2ML IJ SOLN
INTRAMUSCULAR | Status: AC
  Administered 2018-08-05: 1 mg via INTRAVENOUS
  Filled 2018-08-05: qty 2

## 2018-08-05 MED ORDER — QUETIAPINE FUMARATE 400 MG PO TABS
200.0000 mg | ORAL_TABLET | Freq: Every day | ORAL | Status: DC
  Administered 2018-08-05: 200 mg via ORAL
  Filled 2018-08-05: qty 1

## 2018-08-05 MED ORDER — MIDAZOLAM HCL 2 MG/2ML IJ SOLN
1.0000 mg | Freq: Once | INTRAMUSCULAR | Status: AC
  Administered 2018-08-05: 1 mg via INTRAVENOUS

## 2018-08-05 MED ORDER — ACETAMINOPHEN 325 MG PO TABS: 325.0000 mg | ORAL_TABLET | Freq: Four times a day (QID) | ORAL | Status: DC | PRN

## 2018-08-05 MED ORDER — METHOCARBAMOL 1000 MG/10ML IJ SOLN
500.0000 mg | Freq: Four times a day (QID) | INTRAVENOUS | Status: DC | PRN
  Filled 2018-08-05: qty 5

## 2018-08-05 MED ORDER — ONDANSETRON HCL 4 MG PO TABS: 4.0000 mg | ORAL_TABLET | Freq: Four times a day (QID) | ORAL | Status: DC | PRN

## 2018-08-05 MED ORDER — POVIDONE-IODINE 7.5 % EX SOLN
Freq: Once | CUTANEOUS | Status: DC
  Filled 2018-08-05: qty 118

## 2018-08-05 MED ORDER — CEFAZOLIN SODIUM-DEXTROSE 2-4 GM/100ML-% IV SOLN
INTRAVENOUS | Status: AC
  Filled 2018-08-05: qty 100

## 2018-08-05 MED ORDER — PROPOFOL 10 MG/ML IV BOLUS
INTRAVENOUS | Status: AC
  Filled 2018-08-05: qty 20

## 2018-08-05 MED ORDER — PANTOPRAZOLE SODIUM 40 MG PO TBEC
40.0000 mg | DELAYED_RELEASE_TABLET | Freq: Every day | ORAL | Status: DC
  Administered 2018-08-06: 40 mg via ORAL
  Filled 2018-08-05: qty 1

## 2018-08-05 MED ORDER — FENTANYL CITRATE (PF) 100 MCG/2ML IJ SOLN
INTRAMUSCULAR | Status: AC
  Administered 2018-08-05: 50 ug via INTRAVENOUS
  Filled 2018-08-05: qty 2

## 2018-08-05 MED ORDER — HYDROCODONE-ACETAMINOPHEN 7.5-325 MG PO TABS: 1.0000 | ORAL_TABLET | ORAL | Status: DC | PRN

## 2018-08-05 MED ORDER — HYDROMORPHONE HCL 1 MG/ML IJ SOLN
0.2500 mg | INTRAMUSCULAR | Status: DC | PRN
  Administered 2018-08-05: 0.5 mg via INTRAVENOUS

## 2018-08-05 MED ORDER — CEFAZOLIN SODIUM-DEXTROSE 1-4 GM/50ML-% IV SOLN
1.0000 g | Freq: Four times a day (QID) | INTRAVENOUS | Status: AC
  Administered 2018-08-05 – 2018-08-06 (×3): 1 g via INTRAVENOUS
  Filled 2018-08-05 (×5): qty 50

## 2018-08-05 MED ORDER — SCOPOLAMINE 1 MG/3DAYS TD PT72
MEDICATED_PATCH | TRANSDERMAL | Status: AC
  Administered 2018-08-05: 1.5 mg via TRANSDERMAL
  Filled 2018-08-05: qty 1

## 2018-08-05 MED ORDER — SODIUM CHLORIDE 0.9 % IV SOLN
INTRAVENOUS | Status: DC | PRN
  Administered 2018-08-05: 25 ug/min via INTRAVENOUS

## 2018-08-05 MED ORDER — ACETAMINOPHEN 500 MG PO TABS
500.0000 mg | ORAL_TABLET | Freq: Four times a day (QID) | ORAL | Status: AC
  Administered 2018-08-05 – 2018-08-06 (×2): 500 mg via ORAL
  Filled 2018-08-05 (×2): qty 1

## 2018-08-05 MED ORDER — ONDANSETRON HCL 4 MG/2ML IJ SOLN: 4.0000 mg | Freq: Four times a day (QID) | INTRAMUSCULAR | Status: DC | PRN

## 2018-08-05 MED ORDER — HYDROCODONE-ACETAMINOPHEN 7.5-325 MG PO TABS: 1.0000 | ORAL_TABLET | Freq: Once | ORAL | Status: DC | PRN

## 2018-08-05 MED ORDER — ONDANSETRON HCL 4 MG/2ML IJ SOLN
INTRAMUSCULAR | Status: DC | PRN
  Administered 2018-08-05: 4 mg via INTRAVENOUS

## 2018-08-05 MED ORDER — SUGAMMADEX SODIUM 200 MG/2ML IV SOLN
INTRAVENOUS | Status: DC | PRN
  Administered 2018-08-05: 140.4 mg via INTRAVENOUS

## 2018-08-05 MED ORDER — FENTANYL CITRATE (PF) 250 MCG/5ML IJ SOLN
INTRAMUSCULAR | Status: DC | PRN
  Administered 2018-08-05 (×2): 50 ug via INTRAVENOUS

## 2018-08-05 MED ORDER — PROMETHAZINE HCL 25 MG/ML IJ SOLN: 6.2500 mg | INTRAMUSCULAR | Status: DC | PRN

## 2018-08-05 MED ORDER — METOCLOPRAMIDE HCL 5 MG PO TABS: 5.0000 mg | ORAL_TABLET | Freq: Three times a day (TID) | ORAL | Status: DC | PRN

## 2018-08-05 MED ORDER — FENTANYL CITRATE (PF) 100 MCG/2ML IJ SOLN
50.0000 ug | Freq: Once | INTRAMUSCULAR | Status: AC
  Administered 2018-08-05: 50 ug via INTRAVENOUS

## 2018-08-05 MED ORDER — LORAZEPAM 0.5 MG PO TABS: 0.5000 mg | ORAL_TABLET | Freq: Every day | ORAL | Status: DC | PRN

## 2018-08-05 MED ORDER — SODIUM CHLORIDE 0.9 % IV SOLN
INTRAVENOUS | Status: DC
  Administered 2018-08-05: 14:00:00 via INTRAVENOUS

## 2018-08-05 MED ORDER — ROCURONIUM BROMIDE 10 MG/ML (PF) SYRINGE
PREFILLED_SYRINGE | INTRAVENOUS | Status: DC | PRN
  Administered 2018-08-05: 40 mg via INTRAVENOUS

## 2018-08-05 MED ORDER — ESCITALOPRAM OXALATE 10 MG PO TABS
20.0000 mg | ORAL_TABLET | Freq: Every day | ORAL | Status: DC
  Administered 2018-08-05: 20 mg via ORAL
  Filled 2018-08-05: qty 2

## 2018-08-05 MED ORDER — SODIUM CHLORIDE 0.9 % IR SOLN
Status: DC | PRN
  Administered 2018-08-05: 3000 mL

## 2018-08-05 MED ORDER — ACETAMINOPHEN 10 MG/ML IV SOLN: 1000.0000 mg | Freq: Once | INTRAVENOUS | Status: DC | PRN

## 2018-08-05 MED ORDER — LACTATED RINGERS IV SOLN
INTRAVENOUS | Status: DC
  Administered 2018-08-05 (×2): via INTRAVENOUS

## 2018-08-05 MED ORDER — DIPHENHYDRAMINE HCL 12.5 MG/5ML PO ELIX: 12.5000 mg | ORAL_SOLUTION | ORAL | Status: DC | PRN

## 2018-08-05 MED ORDER — METOCLOPRAMIDE HCL 5 MG/ML IJ SOLN: 5.0000 mg | Freq: Three times a day (TID) | INTRAMUSCULAR | Status: DC | PRN

## 2018-08-05 MED ORDER — LEVOTHYROXINE SODIUM 112 MCG PO TABS
112.0000 ug | ORAL_TABLET | Freq: Every day | ORAL | Status: DC
  Administered 2018-08-06: 112 ug via ORAL
  Filled 2018-08-05: qty 1

## 2018-08-05 MED ORDER — POLYETHYLENE GLYCOL 3350 17 G PO PACK: 17.0000 g | PACK | Freq: Every day | ORAL | Status: DC | PRN

## 2018-08-05 MED ORDER — 0.9 % SODIUM CHLORIDE (POUR BTL) OPTIME
TOPICAL | Status: DC | PRN
  Administered 2018-08-05: 1000 mL

## 2018-08-05 MED ORDER — CEFAZOLIN SODIUM-DEXTROSE 2-4 GM/100ML-% IV SOLN
2.0000 g | INTRAVENOUS | Status: AC
  Administered 2018-08-05: 2 g via INTRAVENOUS

## 2018-08-05 MED ORDER — BUPIVACAINE HCL (PF) 0.5 % IJ SOLN
INTRAMUSCULAR | Status: DC | PRN
  Administered 2018-08-05: 10 mL

## 2018-08-05 MED ORDER — MORPHINE SULFATE (PF) 2 MG/ML IV SOLN: 0.5000 mg | INTRAVENOUS | Status: DC | PRN

## 2018-08-05 MED ORDER — BISACODYL 5 MG PO TBEC: 5.0000 mg | DELAYED_RELEASE_TABLET | Freq: Every day | ORAL | Status: DC | PRN

## 2018-08-05 MED ORDER — MENTHOL 3 MG MT LOZG: 1.0000 | LOZENGE | OROMUCOSAL | Status: DC | PRN

## 2018-08-05 MED ORDER — FLEET ENEMA 7-19 GM/118ML RE ENEM: 1.0000 | ENEMA | Freq: Once | RECTAL | Status: DC | PRN

## 2018-08-05 MED ORDER — LIDOCAINE 2% (20 MG/ML) 5 ML SYRINGE
INTRAMUSCULAR | Status: DC | PRN
  Administered 2018-08-05: 100 mg via INTRAVENOUS

## 2018-08-05 MED ORDER — HYDROCODONE-ACETAMINOPHEN 5-325 MG PO TABS
1.0000 | ORAL_TABLET | ORAL | Status: DC | PRN
  Administered 2018-08-05: 2 via ORAL
  Administered 2018-08-05: 1 via ORAL
  Administered 2018-08-06 (×2): 2 via ORAL
  Filled 2018-08-05 (×2): qty 2
  Filled 2018-08-05: qty 1
  Filled 2018-08-05 (×2): qty 2

## 2018-08-05 MED ORDER — PROPOFOL 10 MG/ML IV BOLUS
INTRAVENOUS | Status: DC | PRN
  Administered 2018-08-05: 100 mg via INTRAVENOUS

## 2018-08-05 MED ORDER — PHENOL 1.4 % MT LIQD: 1.0000 | OROMUCOSAL | Status: DC | PRN

## 2018-08-05 MED ORDER — DOCUSATE SODIUM 100 MG PO CAPS
100.0000 mg | ORAL_CAPSULE | Freq: Two times a day (BID) | ORAL | Status: DC
  Administered 2018-08-05 – 2018-08-06 (×3): 100 mg via ORAL
  Filled 2018-08-05 (×3): qty 1

## 2018-08-05 MED ORDER — MEPERIDINE HCL 50 MG/ML IJ SOLN: 6.2500 mg | INTRAMUSCULAR | Status: DC | PRN

## 2018-08-05 MED ORDER — METHOCARBAMOL 500 MG PO TABS: 500.0000 mg | ORAL_TABLET | Freq: Four times a day (QID) | ORAL | Status: DC | PRN

## 2018-08-05 MED ORDER — DIVALPROEX SODIUM 500 MG PO DR TAB
500.0000 mg | DELAYED_RELEASE_TABLET | Freq: Every day | ORAL | Status: DC
  Administered 2018-08-05: 500 mg via ORAL
  Filled 2018-08-05: qty 1

## 2018-08-05 MED ORDER — GABAPENTIN 300 MG PO CAPS
300.0000 mg | ORAL_CAPSULE | Freq: Every day | ORAL | Status: DC
  Administered 2018-08-05: 300 mg via ORAL
  Filled 2018-08-05: qty 1

## 2018-08-05 SURGICAL SUPPLY — 72 items
BIT DRILL 5/64X5 DISP (BIT) ×3 IMPLANT
BLADE SAW SAG 73X25 THK (BLADE) ×2
BLADE SAW SGTL 73X25 THK (BLADE) ×1 IMPLANT
BLADE SURG 15 STRL LF DISP TIS (BLADE) ×1 IMPLANT
BLADE SURG 15 STRL SS (BLADE) ×2
CEMENT BONE DEPUY (Cement) ×3 IMPLANT
CHLORAPREP W/TINT 26ML (MISCELLANEOUS) ×3 IMPLANT
CLOSURE WOUND 1/2 X4 (GAUZE/BANDAGES/DRESSINGS) ×1
COVER SURGICAL LIGHT HANDLE (MISCELLANEOUS) ×3 IMPLANT
COVER WAND RF STERILE (DRAPES) ×3 IMPLANT
DRAPE INCISE IOBAN 66X45 STRL (DRAPES) ×3 IMPLANT
DRAPE ORTHO SPLIT 77X108 STRL (DRAPES) ×4
DRAPE SURG 17X23 STRL (DRAPES) ×3 IMPLANT
DRAPE SURG ORHT 6 SPLT 77X108 (DRAPES) ×2 IMPLANT
DRAPE U-SHAPE 47X51 STRL (DRAPES) ×3 IMPLANT
DRESSING AQUACEL AG SP 3.5X6 (GAUZE/BANDAGES/DRESSINGS) ×1 IMPLANT
DRSG AQUACEL AG ADV 3.5X 6 (GAUZE/BANDAGES/DRESSINGS) IMPLANT
DRSG AQUACEL AG ADV 3.5X10 (GAUZE/BANDAGES/DRESSINGS) IMPLANT
DRSG AQUACEL AG SP 3.5X6 (GAUZE/BANDAGES/DRESSINGS) ×3
ELECT BLADE 4.0 EZ CLEAN MEGAD (MISCELLANEOUS)
ELECT REM PT RETURN 9FT ADLT (ELECTROSURGICAL) ×3
ELECTRODE BLDE 4.0 EZ CLN MEGD (MISCELLANEOUS) IMPLANT
ELECTRODE REM PT RTRN 9FT ADLT (ELECTROSURGICAL) ×1 IMPLANT
GLENOID PEG SHOULDER 40MM SML (Shoulder) ×1 IMPLANT
GLOVE BIO SURGEON STRL SZ7 (GLOVE) ×3 IMPLANT
GLOVE BIO SURGEON STRL SZ7.5 (GLOVE) ×3 IMPLANT
GLOVE BIOGEL PI IND STRL 7.0 (GLOVE) ×1 IMPLANT
GLOVE BIOGEL PI IND STRL 8 (GLOVE) ×1 IMPLANT
GLOVE BIOGEL PI INDICATOR 7.0 (GLOVE) ×2
GLOVE BIOGEL PI INDICATOR 8 (GLOVE) ×2
GOWN STRL REUS W/ TWL LRG LVL3 (GOWN DISPOSABLE) ×1 IMPLANT
GOWN STRL REUS W/ TWL XL LVL3 (GOWN DISPOSABLE) ×1 IMPLANT
GOWN STRL REUS W/TWL LRG LVL3 (GOWN DISPOSABLE) ×2
GOWN STRL REUS W/TWL XL LVL3 (GOWN DISPOSABLE) ×2
GUIDEWIRE GLENOID 2.5X220 (WIRE) ×3 IMPLANT
HANDPIECE INTERPULSE COAX TIP (DISPOSABLE) ×2
HEAD HUM 3.5XHI OFST 15X41 (Joint) ×1 IMPLANT
HEAD HUM AEQUALIS 41X15 (Joint) ×2 IMPLANT
HEMOSTAT SURGICEL 2X14 (HEMOSTASIS) ×3 IMPLANT
HOOD PEEL AWAY FLYTE STAYCOOL (MISCELLANEOUS) ×6 IMPLANT
KIT BASIN OR (CUSTOM PROCEDURE TRAY) ×3 IMPLANT
KIT TURNOVER KIT B (KITS) ×3 IMPLANT
MANIFOLD NEPTUNE II (INSTRUMENTS) ×3 IMPLANT
NEEDLE MAYO TROCAR (NEEDLE) ×3 IMPLANT
NS IRRIG 1000ML POUR BTL (IV SOLUTION) ×3 IMPLANT
PACK SHOULDER (CUSTOM PROCEDURE TRAY) ×3 IMPLANT
PAD ARMBOARD 7.5X6 YLW CONV (MISCELLANEOUS) ×6 IMPLANT
PIN GUIDE 2.5X200 (PIN) ×3 IMPLANT
RESTRAINT HEAD UNIVERSAL NS (MISCELLANEOUS) ×3 IMPLANT
RETRIEVER SUT HEWSON (MISCELLANEOUS) ×3 IMPLANT
SET HNDPC FAN SPRY TIP SCT (DISPOSABLE) ×1 IMPLANT
SHOULDER GLENOID PEG 40MM SML (Shoulder) ×3 IMPLANT
SLING ARM FOAM STRAP LRG (SOFTGOODS) ×3 IMPLANT
SLING ARM FOAM STRAP MED (SOFTGOODS) ×3 IMPLANT
SMARTMIX MINI TOWER (MISCELLANEOUS) ×3
SPONGE LAP 18X18 X RAY DECT (DISPOSABLE) ×3 IMPLANT
SPONGE LAP 4X18 RFD (DISPOSABLE) IMPLANT
STEM HUM AEQUALIS STD SZ1B 66 (Stem) ×3 IMPLANT
STRIP CLOSURE SKIN 1/2X4 (GAUZE/BANDAGES/DRESSINGS) ×2 IMPLANT
SUCTION FRAZIER HANDLE 10FR (MISCELLANEOUS) ×2
SUCTION TUBE FRAZIER 10FR DISP (MISCELLANEOUS) ×1 IMPLANT
SUPPORT WRAP ARM LG (MISCELLANEOUS) ×3 IMPLANT
SUT ETHIBOND NAB CT1 #1 30IN (SUTURE) ×9 IMPLANT
SUT FIBERWIRE #2 38 T-5 BLUE (SUTURE)
SUT MNCRL AB 4-0 PS2 18 (SUTURE) ×3 IMPLANT
SUT VIC AB 2-0 CT1 27 (SUTURE) ×2
SUT VIC AB 2-0 CT1 TAPERPNT 27 (SUTURE) ×1 IMPLANT
SUTURE FIBERWR #2 38 T-5 BLUE (SUTURE) IMPLANT
TAPE LABRALWHITE 1.5X36 (TAPE) ×3 IMPLANT
TAPE SUT LABRALTAP WHT/BLK (SUTURE) ×3 IMPLANT
TOWEL OR 17X26 10 PK STRL BLUE (TOWEL DISPOSABLE) ×3 IMPLANT
TOWER SMARTMIX MINI (MISCELLANEOUS) ×1 IMPLANT

## 2018-08-05 NOTE — Op Note (Signed)
Procedure(s): LEFT TOTAL SHOULDER ARTHROPLASTY Procedure Note  TANIJA GERMANI female 62 y.o. 08/05/2018   Preoperative diagnosis: Left shoulder avascular necrosis  Postoperative diagnosis: #1 left shoulder avascular necrosis #2 left proximal long head biceps tendinopathy  Procedure(s) and Anesthesia Type:    * LEFT TOTAL SHOULDER ARTHROPLASTY - Choice  Surgeon(s) and Role:    Jones Broom, MD - Primary   Indications:  62 y.o. female  With left shoulder avascular necrosis. Pain and dysfunction interfered with quality of life and nonoperative treatment with activity modification, NSAIDS and injections failed.     Surgeon: Berline Lopes   Assistants: Damita Lack PA-C [facility] was present and scrubbed throughout the procedure and was essential in positioning, retraction, exposure, and closure)  Anesthesia: General endotracheal anesthesia with preoperative interscalene block given by the attending anesthesiologist    Procedure Detail  LEFT TOTAL SHOULDER ARTHROPLASTY  Findings: Tornier flex anatomic press-fit size 1 stem with a 41 head, cemented size 40s Cortiloc glenoid.   A lesser tuberosity osteotomy was performed and repaired at the conclusion of the procedure.  Estimated Blood Loss:  200 mL         Drains: None   Blood Given: none          Specimens: none        Complications:  * No complications entered in OR log *         Disposition: PACU - hemodynamically stable.         Condition: stable    Procedure:   The patient was identified in the preoperative holding area where I personally marked the operative extremity after verifying with the patient and consent. She  was taken to the operating room where She was transferred to the   operative table.  The patient received an interscalene block in   the holding area by the attending anesthesiologist.  General anesthesia was induced   in the operating room without complication.  The patient did  receive IV  Ancef prior to the commencement of the procedure.  The patient was   placed in the beach-chair position with the back raised about 30   degrees.  The nonoperative extremity and head and neck were carefully   positioned and padded protecting against neurovascular compromise.  The   left upper extremity was then prepped and draped in the standard sterile   fashion.    The appropriate operative time-out was performed with   Anesthesia, the perioperative staff, as well as myself and we all agreed   that the left side was the correct operative site.  The patient received 1 g IV tranexamic acid at the start of the case around time of the incision. An approximately   8 cm incision was made from the tip of the coracoid to the center point of the   humerus at the level of the axilla.  Dissection was carried down sharply   through subcutaneous tissues and cephalic vein was identified and taken   laterally with the deltoid.  The pectoralis major was taken medially.  The   upper 1 cm of the pectoralis major was released from its attachment on   the humerus.  The clavipectoral fascia was incised just lateral to the   conjoined tendon.  This incision was carried up to but not into the   coracoacromial ligament.  Digital palpation was used to prove   integrity of the axillary nerve which was protected throughout the   procedure.  Musculocutaneous  nerve was not palpated in the operative   field.  Conjoined tendon was then retracted gently medially and the   deltoid laterally.  Anterior circumflex humeral vessels were clamped and   coagulated.  The soft tissues overlying the biceps was incised and this   incision was carried across the transverse humeral ligament to the base   of the coracoid.  The biceps was noted to be severely degenerated. It was released from the superior labrum. The biceps was then tenodesed to the soft tissue just above   pectoralis major and the remaining portion of the  biceps superiorly was   excised.  An osteotomy was performed at the lesser tuberosity.  The capsule was then   released all the way down to the 6 o'clock position of the humeral head.   The humeral head was then delivered with simultaneous adduction,   extension and external rotation.  All humeral osteophytes were removed   and the anatomic neck of the humerus was marked and cut free hand at   approximately 25 degrees retroversion within about 3 mm of the cuff   reflection posteriorly.  The head size was estimated to be a 41 medium   offset.  At that point, the humeral head was retracted posteriorly with   a Fukuda retractor.   Remaining portion of the capsule was released at the base of the   coracoid.  The remaining biceps anchor and the entire anterior-inferior   labrum was excised.  The posterior labrum was also excised but the   posterior capsule was not released.  The guidepin was placed bicortically with non elevated guide.  The reamer was used to ream to concentric bone with punctate bleeding.  This gave an excellent concentric surface.  The center hole was then drilled for an anchor peg glenoid followed by the three peripheral holes and none of the holes   exited the glenoid wall.  I then pulse irrigated these holes and dried   them with Surgicel.  The three peripheral holes were then   pressurized cemented and the anchor peg glenoid was placed and impacted   with an excellent fit.  The glenoid was a 40s component.  The proximal humerus was then again exposed taking care not to displace the glenoid.    The entry awl was used followed by sounding reamers and then sequentially broached for size 1.  This was then left in place and the calcar planer was used. Trial head was placed with a 41.  With the trial implantation of the component,  there was approximately 50% posterior translation with immediate snap back to the   anatomic position.  With forward elevation, there was no tendency    towards posterior subluxation.   The trial was removed and the final implant was prepared on a back table.  The trial was removed and the final implant was prepared on a back table.   3 small holes were drilled on the medial side of the lesser tuberosity osteotomy, through which 2 labral tapes were passed. The implant was then placed through the loop of the 2 labral tapes and impacted with an excellent press-fit. This achieved excellent anatomic reconstruction of the proximal humerus.  The joint was then copiously irrigated with pulse lavage.  The subscapularis and   lesser tuberosity osteotomy were then repaired using the 2 labral tapes previously passed in a double row fashion with horizontal mattress sutures medially brought over through bone tunnels tied over a bone  bridge laterally.   One #1 Ethibond was placed at the rotator interval just above   the lesser tuberosity. Copious irrigation was used. Skin was closed with 2-0 Vicryl sutures in the deep dermal layer and 4-0 Monocryl in a subcuticular  running fashion.  Sterile dressings were then applied including Aquacel.  The patient was placed in a sling and allowed to awaken from general anesthesia and taken to the recovery room in stable condition.      POSTOPERATIVE PLAN:  Early passive range of motion will be allowed with the goal of 0 degrees external rotation and 90 degrees forward elevation.  No internal rotation at this time.  No active motion of the arm until the lesser tuberosity heals.  The patient will likely be kept in the hospital for 1-2 days and then discharged home.

## 2018-08-05 NOTE — Plan of Care (Signed)

## 2018-08-05 NOTE — H&P (Signed)
Kaitlyn [name] is an 62 y.o. female.   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  She was found in the left shoulder to also have AVN with early remodeling of the head.  She did very well with the right side and want to proceed with left shoulder arthroplasty to alleviate the pain in her left shoulder.  Past Medical History:  Diagnosis Date  . Arthritis    oa  . Depression   . Frequency of urination   . GERD (gastroesophageal reflux disease)    CONTROLLED W/ PRILOSEC  . Headache    hx of migraines  . Hyperlipemia   . Hypothyroidism   . Incontinence of urine   . Insomnia   . Interstitial cystitis   . Pelvic pain syndrome I.C.  . PONV (postoperative nausea and vomiting)   . Urgency of urination   . Wears dentures    UPPER  . Wears glasses     Past Surgical History:  Procedure Laterality Date  . BILATERAL ANTERIOR TOTAL HIP ARTHROPLASTY Bilateral 11/06/2017   Procedure: BILATERAL TOTAL HIP ARTHROPLASTY ANTERIOR APPROACH;  Surgeon: Kathryne HitchBlackman, Christopher Y, MD;  Location: [facility];  Service: Orthopedics;  Laterality: Bilateral;  . CYSTO WITH HYDRODISTENSION  09/18/2011   Procedure: CYSTOSCOPY/HYDRODISTENSION;  Surgeon: Anner CreteJohn J Wrenn;  Location: [facility];  Service: Urology;  Laterality: N/A;  Instillation of Marcaine & Pyridium  . CYSTO WITH HYDRODISTENSION N/A 05/31/2013   Procedure: CYSTOSCOPY/HYDRODISTENSION INSTALLATION OF MARCAINE/PYRIDIUM;  Surgeon: Antony HasteMatthew Ramsey Eskridge, MD;  [facility] [place];  Service: Urology;  Laterality: N/A;  . CYSTO WITH HYDRODISTENSION N/A 01/24/2014   Procedure: CYSTOSCOPY/HYDRODISTENSION INSTALLATION OF MARCAINE AND PYRIDIUM;  Surgeon: Anner CreteJohn J Wrenn, MD;  [facility] [place];  Service: Urology;  Laterality: N/A;  . CYSTO WITH HYDRODISTENSION N/A 08/03/2014   Procedure:  CYSTO/HYDRODISTENSION OF BLADDER/INSTILL  PYRIDIUM/MARCAINE;  Surgeon: Anner CreteJohn J Wrenn, MD;  [facility] [place];  Service: Urology;  Laterality: N/A;  . CYSTO WITH HYDRODISTENSION N/A 11/08/2015   Procedure: CYSTOSCOPY/HYDRODISTENSION;  Surgeon: Bjorn PippinJohn Wrenn, MD;  [facility] [place];  Service: Urology;  Laterality: N/A;  . CYSTO WITH HYDRODISTENSION N/A 12/11/2016   Procedure: CYSTOSCOPY/HYDRODISTENSION;  Surgeon: Bjorn PippinJohn Wrenn, MD;  [facility] [place];  Service: Urology;  Laterality: N/A;  . CYSTO/ HOD/ INSTILLATION THERAPY  LAST ONE 08-27-2010   MULTIPLE TIMES  . INCISION AND DRAINAGE HIP Right 01/01/2018   Procedure: IRRIGATION AND DEBRIDEMENT RIGHT HIP INCISION;  Surgeon: Kathryne HitchBlackman, Christopher Y, MD;  Location: [facility];  Service: Orthopedics;  Laterality: Right;  . JOINT REPLACEMENT     bilateral hip c. blackman 11-06-17  . TONSILLECTOMY    . TOTAL SHOULDER ARTHROPLASTY Right 04/15/2018  . TOTAL SHOULDER ARTHROPLASTY Right 04/15/2018   Procedure: TOTAL SHOULDER ARTHROPLASTY;  Surgeon: Jones Broomhandler, Asiah Browder, MD;  [facility];  Service: Orthopedics;  Laterality: Right;  . TRANSTHORACIC ECHOCARDIOGRAM  07-06-2012   mild LVH, ef 55-65%, grade 1 diastolic dysfunction/  mild AR/  trivial MR and TR  . VAGINAL HYSTERECTOMY  1980'S   complete    History reviewed. No pertinent family history. Social History:  reports that she has been smoking cigarettes. She has a 6.50 pack-year smoking history. She has never used smokeless tobacco. She reports that she does not drink alcohol or use drugs.  Allergies:  Allergies  Allergen Reactions  . Aspirin Other (See  Comments)    Gi upset  . Sulfa Antibiotics Rash  . Trilafon [Perphenazine] Rash    TRILA    Medications Prior to Admission  Medication Sig Dispense Refill  . acetaminophen (TYLENOL) 500 MG tablet Take 1,000 mg by mouth every 6 (six) hours as needed for mild pain.    . Aspirin-Salicylamide-Caffeine  (BC FAST PAIN RELIEF) 650-195-33.3 MG PACK Take 1 Package by mouth as needed (pain).    Marland Kitchen divalproex (DEPAKOTE) 500 MG DR tablet Take 500 mg by mouth at bedtime.  3  . escitalopram (LEXAPRO) 20 MG tablet Take 20 mg by mouth at bedtime.     . gabapentin (NEURONTIN) 300 MG capsule Take 300 mg by mouth at bedtime. at bedtime.  1  . levothyroxine (SYNTHROID, LEVOTHROID) 112 MCG tablet Take 112 mcg by mouth daily.  5  . Melatonin 10 MG TABS Take 10 mg by mouth at bedtime.    Marland Kitchen omeprazole (PRILOSEC) 20 MG capsule Take 20 mg by mouth every morning.     Marland Kitchen QUEtiapine (SEROQUEL) 200 MG tablet Take 200 mg by mouth at bedtime.    . rizatriptan (MAXALT) 10 MG tablet Take 10 mg by mouth 2 (two) times daily as needed. For migraine headaches.  3  . HYDROcodone-acetaminophen (NORCO) 5-325 MG tablet Take 1-2 tablets by mouth every 4 (four) hours as needed for moderate pain. (Patient not taking: Reported on 07/29/2018) 30 tablet 0  . LORazepam (ATIVAN) 0.5 MG tablet Take 0.5 mg by mouth daily as needed.      No results found for this or any previous visit (from the past 48 hour(s)). No results found.  Review of Systems  All other systems reviewed and are negative.   Blood pressure (!) 153/83, pulse 95, temperature 97.8 F (36.6 C), resp. rate 20, SpO2 98 %. Physical Exam  Constitutional: She is oriented to person, place, and time. She appears well-developed and well-nourished.  HENT:  Head: Atraumatic.  Eyes: EOM are normal.  Cardiovascular: Intact distal pulses.  Respiratory: Effort normal.  Musculoskeletal:  L shoulder pain with ROM. NVID.  Neurological: She is alert and oriented to person, place, and time.  Skin: Skin is warm and dry.  Psychiatric: She has a normal mood and affect.     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  She was found in the left shoulder to also have AVN with early remodeling of the head.   She did very well with the right side and want to proceed with left shoulder arthroplasty to alleviate the pain in her left shoulder. Plan left total shoulder arthroplasty Risks / benefits of surgery discussed Consent on chart  NPO for OR Preop antibiotics   Berline Lopes, MD 08/05/2018, 9:08 AM

## 2018-08-05 NOTE — Anesthesia Procedure Notes (Signed)
Anesthesia Regional Block: Interscalene brachial plexus block   Pre-Anesthetic Checklist: ,, timeout performed, Correct Patient, Correct Site, Correct Laterality, Correct Procedure, Correct Position, site marked, Risks and benefits discussed, at surgeon's request and post-op pain management  Laterality: Upper and Left  Prep: Betadine, chloraprep, alcohol swabs       Needles:  Injection technique: Single-shot  Needle Type: Echogenic Needle     Needle Length: 5cm  Needle Gauge: 22     Additional Needles:   Procedures:, nerve stimulator,,,,,,,  Narrative:  Start time: 08/05/2018 8:54 AM End time: 08/05/2018 9:01 AM  Performed by: Personally  Anesthesiologist: Sharee HolsterMassagee, Terry, MD  Additional Notes: Block assessed prior to start of surgery. Pt tolerated procedure well.

## 2018-08-05 NOTE — Anesthesia Procedure Notes (Signed)
Procedure Name: Intubation Date/Time: 08/05/2018 10:03 AM Performed by: Marena ChancyBeckner, Milbern Doescher S, CRNA Pre-anesthesia Checklist: Patient identified, Emergency Drugs available, Suction available and Patient being monitored Patient Re-evaluated:Patient Re-evaluated prior to induction Oxygen Delivery Method: Circle System Utilized Preoxygenation: Pre-oxygenation with 100% oxygen Induction Type: IV induction Ventilation: Mask ventilation without difficulty Laryngoscope Size: Miller and 2 Grade View: Grade I Tube type: Oral Tube size: 7.0 mm Number of attempts: 1 Airway Equipment and Method: Stylet and Oral airway Placement Confirmation: ETT inserted through vocal cords under direct vision,  positive ETCO2 and breath sounds checked- equal and bilateral Tube secured with: Tape Dental Injury: Teeth and Oropharynx as per pre-operative assessment

## 2018-08-05 NOTE — Anesthesia Postprocedure Evaluation (Signed)
Anesthesia Post Note  Patient: Kaitlyn LowensteinDebra K [name]  Procedure(s) Performed: LEFT TOTAL SHOULDER ARTHROPLASTY (Left Shoulder)     Patient location during evaluation: PACU Anesthesia Type: General Level of consciousness: awake and alert Pain management: pain level controlled Vital Signs Assessment: post-procedure vital signs reviewed and stable Respiratory status: spontaneous breathing, nonlabored ventilation, respiratory function stable and patient connected to nasal cannula oxygen Cardiovascular status: blood pressure returned to baseline and stable Postop Assessment: no apparent nausea or vomiting Anesthetic complications: no    Last Vitals:  Vitals:   08/05/18 1240 08/05/18 1308  BP: 94/80 (!) 125/91  Pulse: 99 99  Resp: 11 20  Temp: 36.5 C 36.8 C  SpO2: 96% 98%    Last Pain:  Vitals:   08/05/18 1308  PainSc: 6                  Trevor IhaStephen A Houser

## 2018-08-05 NOTE — Transfer of Care (Signed)
Immediate Anesthesia Transfer of Care Note  Patient: Kaitlyn LowensteinDebra K Cropper  Procedure(s) Performed: LEFT TOTAL SHOULDER ARTHROPLASTY (Left Shoulder)  Patient Location: PACU  Anesthesia Type:General and Regional  Level of Consciousness: patient cooperative and responds to stimulation  Airway & Oxygen Therapy: Patient Spontanous Breathing and Patient connected to nasal cannula oxygen  Post-op Assessment: Report given to RN and Post -op Vital signs reviewed and stable  Post vital signs: Reviewed and stable  Last Vitals:  Vitals Value Taken Time  BP 140/96 08/05/2018 11:35 AM  Temp 36.5 C 08/05/2018 11:36 AM  Pulse 96 08/05/2018 11:43 AM  Resp 15 08/05/2018 11:43 AM  SpO2 93 % 08/05/2018 11:43 AM  Vitals shown include unvalidated device data.  Last Pain:  Vitals:   08/05/18 1136  PainSc: 0-No pain         Complications: No apparent anesthesia complications

## 2018-08-06 ENCOUNTER — Encounter (HOSPITAL_COMMUNITY): Payer: Self-pay | Admitting: Orthopedic Surgery

## 2018-08-06 ENCOUNTER — Other Ambulatory Visit: Payer: Self-pay

## 2018-08-06 LAB — BASIC METABOLIC PANEL
ANION GAP: 5 (ref 5–15)
BUN: 19 mg/dL (ref 8–23)
CO2: 22 mmol/L (ref 22–32)
Calcium: 8.6 mg/dL — ABNORMAL LOW (ref 8.9–10.3)
Chloride: 110 mmol/L (ref 98–111)
Creatinine, Ser: 1.09 mg/dL — ABNORMAL HIGH (ref 0.44–1.00)
GFR calc non Af Amer: 53 mL/min — ABNORMAL LOW (ref >60)
Glucose, Bld: 121 mg/dL — ABNORMAL HIGH (ref 70–99)
POTASSIUM: 4.8 mmol/L (ref 3.5–5.1)
Sodium: 137 mmol/L (ref 135–145)

## 2018-08-06 LAB — CBC
HEMATOCRIT: 26.3 % — AB (ref 36.0–46.0)
Hemoglobin: 7.1 g/dL — ABNORMAL LOW (ref 12.0–15.0)
MCH: 22.4 pg — ABNORMAL LOW (ref 26.0–34.0)
MCHC: 27 g/dL — AB (ref 30.0–36.0)
MCV: 83 fL (ref 80.0–100.0)
NRBC: 0 % (ref 0.0–0.2)
Platelets: 431 10*3/uL — ABNORMAL HIGH (ref 150–400)
RBC: 3.17 MIL/uL — ABNORMAL LOW (ref 3.87–5.11)
RDW: 18 % — ABNORMAL HIGH (ref 11.5–15.5)
WBC: 11.8 10*3/uL — AB (ref 4.0–10.5)

## 2018-08-06 LAB — PREPARE RBC (CROSSMATCH)

## 2018-08-06 MED ORDER — FUROSEMIDE 10 MG/ML IJ SOLN
20.0000 mg | Freq: Once | INTRAMUSCULAR | Status: AC
  Administered 2018-08-06: 20 mg via INTRAVENOUS
  Filled 2018-08-06: qty 2

## 2018-08-06 MED ORDER — HYDROCODONE-ACETAMINOPHEN 5-325 MG PO TABS: 1.0000 | ORAL_TABLET | ORAL | 0 refills | Status: DC | PRN

## 2018-08-06 MED ORDER — SODIUM CHLORIDE 0.9% IV SOLUTION
Freq: Once | INTRAVENOUS | Status: AC
  Administered 2018-08-06: 10:00:00 via INTRAVENOUS

## 2018-08-06 NOTE — Plan of Care (Signed)

## 2018-08-06 NOTE — Progress Notes (Signed)
   PATIENT ID: Kaitlyn [name]   1 Day Post-Op Procedure(s) (LRB): LEFT TOTAL SHOULDER ARTHROPLASTY (Left)  Subjective: Doing well. Tired. Min pain.   Objective:  Vitals:   08/06/18 0451 08/06/18 0805  BP: 122/78 127/80  Pulse: 88 (!) 104  Resp: 16 18  Temp: 98.2 F (36.8 C) 98.2 F (36.8 C)  SpO2: 100% 93%     L UE dressing c/d/i Wiggles fingers, block still working  Labs:  Recent Labs    08/06/18 0156  HGB 7.1*   Recent Labs    08/06/18 0156  WBC 11.8*  RBC 3.17*  HCT 26.3*  PLT 431*   Recent Labs    08/06/18 0156  NA 137  K 4.8  CL 110  CO2 22  BUN 19  CREATININE 1.09*  GLUCOSE 121*  CALCIUM 8.6*    Assessment and Plan: 1 day s/p L TSA ABLA- low today and preoperatively but not chronic, will transfuse 2 units RBC today prior to d/c  OT- PROM goal to 90FF, 0 ER D/c home later day Fu with Dr. Ave Filterhandler in 2 weeks  VTE proph: scds

## 2018-08-06 NOTE — Discharge Instructions (Signed)

## 2018-08-06 NOTE — Progress Notes (Signed)
AVS and printed prescription given and reviewed with pt and pt's husband. All questions answered to satisfaction. Pt escorted off the unit via wheelchair by staff member.

## 2018-08-06 NOTE — Plan of Care (Signed)
  Problem: Education: Goal: Knowledge of General Education information will improve Description Including pain rating scale, medication(s)/side effects and non-pharmacologic comfort measures Outcome: Progressing   Problem: Health Behavior/Discharge Planning: Goal: Ability to manage health-related needs will improve Outcome: Progressing   Problem: Clinical Measurements: Goal: Ability to maintain clinical measurements within normal limits will improve Outcome: Progressing Goal: Will remain free from infection Outcome: Progressing Note:  Pt receiving IV antibiotics.  Goal: Respiratory complications will improve Outcome: Progressing   Problem: Activity: Goal: Risk for activity intolerance will decrease Outcome: Progressing   Problem: Nutrition: Goal: Adequate nutrition will be maintained Outcome: Progressing   Problem: Elimination: Goal: Will not experience complications related to bowel motility Outcome: Progressing Goal: Will not experience complications related to urinary retention Outcome: Progressing   Problem: Safety: Goal: Ability to remain free from injury will improve Outcome: Progressing Note:  Pt remains free of injury. Bed locked and in lowest position. Call bell within reach. Will continue to monitor.

## 2018-08-06 NOTE — Discharge Summary (Signed)
Patient ID: Kaitlyn [name] MRN: [id number] DOB/AGE: [birth year] 62 y.o.  Admit date: 08/05/2018 Discharge date: 08/06/2018  Admission Diagnoses:  Active Problems:   S/P shoulder replacement, left   Discharge Diagnoses:  Same  Past Medical History:  Diagnosis Date  . Arthritis    oa  . Depression   . Frequency of urination   . GERD (gastroesophageal reflux disease)    CONTROLLED W/ PRILOSEC  . Headache    hx of migraines  . Hyperlipemia   . Hypothyroidism   . Incontinence of urine   . Insomnia   . Interstitial cystitis   . Pelvic pain syndrome I.C.  . PONV (postoperative nausea and vomiting)   . Urgency of urination   . Wears dentures    UPPER  . Wears glasses     Surgeries: Procedure(s): LEFT TOTAL SHOULDER ARTHROPLASTY on 08/05/2018   Consultants:   Discharged Condition: Improved  Hospital Course: Kaitlyn [name] is an 62 y.o. female who was admitted 08/05/2018 for operative treatment of left shoulder OA. Patient has severe unremitting pain that affects sleep, daily activities, and work/hobbies. After pre-op clearance the patient was taken to the operating room on 08/05/2018 and underwent  Procedure(s): LEFT TOTAL SHOULDER ARTHROPLASTY.    Patient was given perioperative antibiotics:  Anti-infectives (From admission, onward)   Start     Dose/Rate Route Frequency Ordered Stop   08/05/18 1600  ceFAZolin (ANCEF) IVPB 1 g/50 mL premix     1 g 100 mL/hr over 30 Minutes Intravenous Every 6 hours 08/05/18 1303 08/06/18 0640   08/05/18 0800  ceFAZolin (ANCEF) IVPB 2g/100 mL premix     2 g 200 mL/hr over 30 Minutes Intravenous On call to O.R. 08/05/18 0751 08/05/18 1006   08/05/18 0756  ceFAZolin (ANCEF) 2-4 GM/100ML-% IVPB    Note to Pharmacy:  [name], Meredit: cabinet override      08/05/18 0756 08/05/18 1006       Patient was given sequential compression devices, early ambulation, and chemoprophylaxis to prevent DVT.  Patient benefited maximally from hospital  stay and there were no complications.    Recent vital signs:  Patient Vitals for the past 24 hrs:  BP Temp Temp src Pulse Resp SpO2  08/06/18 0805 127/80 98.2 F (36.8 C) Oral (!) 104 18 93 %  08/06/18 0451 122/78 98.2 F (36.8 C) Oral 88 16 100 %  08/05/18 2331 128/74 97.9 F (36.6 C) Oral (!) 107 16 97 %  08/05/18 1954 113/77 97.8 F (36.6 C) Oral 97 18 96 %  08/05/18 1308 (!) 125/91 98.2 F (36.8 C) - 99 20 98 %  08/05/18 1240 94/80 97.7 F (36.5 C) - 99 11 96 %  08/05/18 1236 111/60 - - 98 14 96 %  08/05/18 1221 121/69 - - (!) 105 14 96 %  08/05/18 1205 (!) 128/96 - - 99 15 94 %  08/05/18 1151 112/76 - - 96 15 94 %  08/05/18 1136 - 97.7 F (36.5 C) - - - -  08/05/18 1135 (!) 140/96 - - 100 - (!) 88 %  08/05/18 0915 135/78 - - 98 18 95 %  08/05/18 0910 (!) 134/103 - - 100 16 97 %  08/05/18 0905 (!) 157/90 - - 98 15 97 %  08/05/18 0900 (!) 160/97 - - 94 19 97 %  08/05/18 0855 - - - 92 (!) 22 94 %     Recent laboratory studies:  Recent Labs    08/06/18 0156  WBC  11.8*  HGB 7.1*  HCT 26.3*  PLT 431*  NA 137  K 4.8  CL 110  CO2 22  BUN 19  CREATININE 1.09*  GLUCOSE 121*  CALCIUM 8.6*     Discharge Medications:   Allergies as of 08/06/2018      Reactions   Aspirin Other (See Comments)   Gi upset   Sulfa Antibiotics Rash   Trilafon [perphenazine] Rash   TRILA      Medication List    STOP taking these medications   acetaminophen 500 MG tablet Commonly known as:  TYLENOL     TAKE these medications   BC FAST PAIN RELIEF 650-195-33.3 MG Pack Generic drug:  Aspirin-Salicylamide-Caffeine Take 1 Package by mouth as needed (pain).   divalproex 500 MG DR tablet Commonly known as:  DEPAKOTE Take 500 mg by mouth at bedtime.   escitalopram 20 MG tablet Commonly known as:  LEXAPRO Take 20 mg by mouth at bedtime.   gabapentin 300 MG capsule Commonly known as:  NEURONTIN Take 300 mg by mouth at bedtime. at bedtime.   HYDROcodone-acetaminophen 5-325 MG  tablet Commonly known as:  NORCO/VICODIN Take 1-2 tablets by mouth every 4 (four) hours as needed for moderate pain.   levothyroxine 112 MCG tablet Commonly known as:  SYNTHROID, LEVOTHROID Take 112 mcg by mouth daily.   LORazepam 0.5 MG tablet Commonly known as:  ATIVAN Take 0.5 mg by mouth daily as needed.   Melatonin 10 MG Tabs Take 10 mg by mouth at bedtime.   omeprazole 20 MG capsule Commonly known as:  PRILOSEC Take 20 mg by mouth every morning.   QUEtiapine 200 MG tablet Commonly known as:  SEROQUEL Take 200 mg by mouth at bedtime.   rizatriptan 10 MG tablet Commonly known as:  MAXALT Take 10 mg by mouth 2 (two) times daily as needed. For migraine headaches.       Diagnostic Studies: Dg Shoulder Left Port  Result Date: 08/05/2018 CLINICAL DATA:  Post LEFT shoulder replacement EXAM: LEFT SHOULDER - 1 VIEW COMPARISON:  Portable exam 1153 hours compared to 05/17/2007 FINDINGS: LEFT shoulder prosthesis identified. No fracture or dislocation. Bones demineralized. AC joint alignment normal. IMPRESSION: LEFT shoulder prosthesis without acute complication identified. Electronically Signed   By: Ulyses Southward M.D.   On: 08/05/2018 12:10    Disposition: Discharge disposition: 01-Home or Self Care       Discharge Instructions    Call MD / Call 911   Complete by:  As directed    If you experience chest pain or shortness of breath, CALL 911 and be transported to the hospital emergency room.  If you develope a fever above 101 F, pus (white drainage) or increased drainage or redness at the wound, or calf pain, call your surgeon's office.   Constipation Prevention   Complete by:  As directed    Drink plenty of fluids.  Prune juice may be helpful.  You may use a stool softener, such as Colace (over the counter) 100 mg twice a day.  Use MiraLax (over the counter) for constipation as needed.   Diet - low sodium heart healthy   Complete by:  As directed    Increase activity  slowly as tolerated   Complete by:  As directed          Signed: Jiles Harold 08/06/2018, 8:16 AM

## 2018-08-06 NOTE — Evaluation (Signed)
Occupational Therapy Evaluation Patient Details Name: Kaitlyn [name] MRN: [id number] DOB: [birth year] Today's Date: 08/06/2018    History of Present Illness Pt is a 62 y/o female now s/p L TSA. PMHx includes depression, GERD urinary incontinence, hx of bil THA in 10/2017, R TSA 04/2018   Clinical Impression   Pt admitted with the above diagnoses and presents with below problem list. At baseline, pt is independent with ADLs. Pt is currently min A for UB/LB ADLs and grooming tasks, supervision to ambulate. Noted that pt with Hgb 7.1 and plan to transfuse; limited mobility this session with pt tolerating to/from bathroom well. Pt with good recall of ADL techniques from previous surgery. No further OT needs indicated at this time. OT signing off.      Follow Up Recommendations  Follow surgeon's recommendation for DC plan and follow-up therapies    Equipment Recommendations  None recommended by OT    Recommendations for Other Services       Precautions / Restrictions Precautions Precautions: Shoulder Shoulder Interventions: Shoulder sling/immobilizer;At all times;Off for dressing/bathing/exercises Precaution Booklet Issued: Yes (comment) Required Braces or Orthoses: Sling Restrictions Weight Bearing Restrictions: Yes LUE Weight Bearing: Non weight bearing      Mobility Bed Mobility Overal bed mobility: Needs Assistance Bed Mobility: Supine to Sit;Sit to Supine     Supine to sit: Supervision Sit to supine: Supervision      Transfers Overall transfer level: Needs assistance Equipment used: None Transfers: Sit to/from Stand Sit to Stand: Supervision              Balance Overall balance assessment: No apparent balance deficits (not formally assessed)                                         ADL either performed or assessed with clinical judgement   ADL Overall ADL's : Needs assistance/impaired Eating/Feeding: Set up;Sitting   Grooming: Minimal  assistance;Sitting   Upper Body Bathing: Minimal assistance;Sitting   Lower Body Bathing: Minimal assistance;Sit to/from stand   Upper Body Dressing : Minimal assistance;Sitting   Lower Body Dressing: Minimal assistance;Sit to/from stand   Toilet Transfer: Supervision/safety;Ambulation   Toileting- Clothing Manipulation and Hygiene: Supervision/safety;Sit to/from stand   Tub/ Shower Transfer: Walk-in shower;Supervision/safety;Ambulation;Min guard   Functional mobility during ADLs: Supervision/safety;Min guard General ADL Comments: Pt completed bed mobility, toilet transfer, and pericare. ADL and shoulder education reviewed.     Vision         Perception     Praxis      Pertinent Vitals/Pain Pain Assessment: 0-10 Pain Score: 4  Pain Location: L shoulder Pain Descriptors / Indicators: Sore;Guarding Pain Intervention(s): Limited activity within patient's tolerance;Monitored during session;Repositioned;RN gave pain meds during session     Hand Dominance Left   Extremity/Trunk Assessment Upper Extremity Assessment Upper Extremity Assessment: RUE deficits/detail;LUE deficits/detail RUE Deficits / Details: R TSA 04/2018, still receiving therapy LUE Deficits / Details: s/p L TSA LUE: Unable to fully assess due to immobilization   Lower Extremity Assessment Lower Extremity Assessment: Overall WFL for tasks assessed   Cervical / Trunk Assessment Cervical / Trunk Assessment: Normal   Communication Communication Communication: No difficulties   Cognition Arousal/Alertness: Awake/alert Behavior During Therapy: WFL for tasks assessed/performed Overall Cognitive Status: Within Functional Limits for tasks assessed  General Comments  Pt c/o itching in peri area. Recently on med for UTI. Notified nursing.    Exercises Exercises: Other exercises Other Exercises Other Exercises: e/w/h ROM, cues for technique, good range and  toleration   Shoulder Instructions      Home Living Family/patient expects to be discharged to:: Private residence Living Arrangements: Spouse/significant other Available Help at Discharge: Family;Available 24 hours/day Type of Home: House Home Access: Stairs to enter [employer] of Steps: 4 Entrance Stairs-Rails: Right Home Layout: One level     Bathroom Shower/Tub: Producer, television/film/video: Standard     Home Equipment: Shower seat   Additional Comments: husband is a [employer] architect, travels M-F, plans to be off to assist pt for ~3wks      Prior Functioning/Environment Level of Independence: Independent with assistive device(s)        Comments: ambulates without AD; uses AE for LB dressing due to recent hip surgery in 10/2017        OT Problem List: Impaired balance (sitting and/or standing);Decreased knowledge of use of DME or AE;Decreased knowledge of precautions;Impaired UE functional use;Pain      OT Treatment/Interventions:      OT Goals(Current goals can be found in the care plan section) Acute Rehab OT Goals Patient Stated Goal: not stated  OT Frequency:     Barriers to D/C:            Co-evaluation              AM-PAC PT "6 Clicks" Daily Activity     Outcome Measure Help from another person eating meals?: None Help from another person taking care of personal grooming?: A Little Help from another person toileting, which includes using toliet, bedpan, or urinal?: None Help from another person bathing (including washing, rinsing, drying)?: A Little Help from another person to put on and taking off regular upper body clothing?: A Little Help from another person to put on and taking off regular lower body clothing?: A Little 6 Click Score: 20   End of Session Equipment Utilized During Treatment: Other (comment)(sling) Nurse Communication: Other (comment)(pt c/o itching in peri area)  Activity Tolerance: Patient tolerated  treatment well Patient left: in bed;with call bell/phone within reach;with SCD's reapplied  OT Visit Diagnosis: Pain;Unsteadiness on feet (R26.81) Pain - Right/Left: Left Pain - part of body: Shoulder                Time: 1610-9604 OT Time Calculation (min): 30 min Charges:  OT General Charges $OT Visit: 1 Visit OT Evaluation $OT Eval Low Complexity: 1 Low OT Treatments $Self Care/Home Management : 8-22 mins   Raynald Kemp, OT Acute Rehabilitation Services Pager: [phone] Office: [phone]   Pilar Grammes 08/06/2018, 10:39 AM

## 2018-08-07 LAB — TYPE AND SCREEN
ABO/RH(D): O POS
ANTIBODY SCREEN: NEGATIVE
UNIT DIVISION: 0
Unit division: 0

## 2018-08-07 LAB — BPAM RBC
BLOOD PRODUCT EXPIRATION DATE: 201912192359
Blood Product Expiration Date: 201912192359
ISSUE DATE / TIME: 201911221333
ISSUE DATE / TIME: 201911221030
UNIT TYPE AND RH: 5100
Unit Type and Rh: 5100

## 2018-08-07 LAB — HEMOGLOBIN AND HEMATOCRIT, BLOOD
HEMATOCRIT: 37.2 % (ref 36.0–46.0)
HEMOGLOBIN: 11.2 g/dL — AB (ref 12.0–15.0)

## 2018-10-25 ENCOUNTER — Ambulatory Visit (INDEPENDENT_AMBULATORY_CARE_PROVIDER_SITE_OTHER): Payer: BLUE CROSS/BLUE SHIELD | Admitting: Orthopaedic Surgery

## 2018-10-25 IMAGING — MR MR SHOULDER*R* W/O CM
5 series · 40 of 40 positions shown · non-contrast
Comparison: None.

CLINICAL DATA: Right shoulder pain.

EXAM:
MRI OF THE RIGHT SHOULDER WITHOUT CONTRAST
TECHNIQUE: Multiplanar, multisequence MR imaging of the shoulder was performed.
No intravenous contrast was administered.

[Series 4: T2 fat-sat · oblique · 4.0mm · 0.59mm/px · 8 of 21 slices shown (1 of 3)]
[im 1/21]
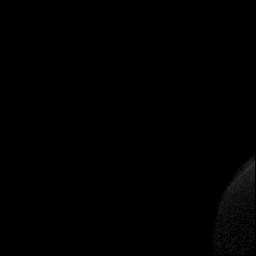
[im 3/21]
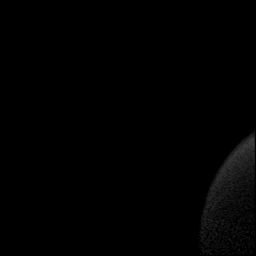
[im 6/21]
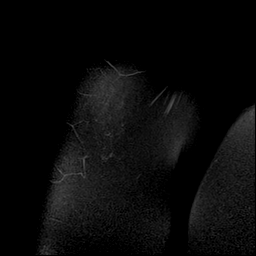
[im 9/21]
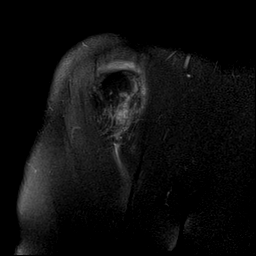
[im 12/21]
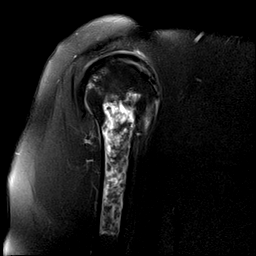
[im 15/21]
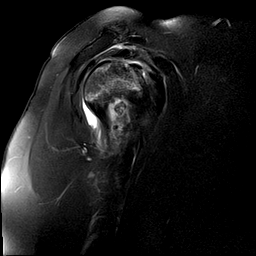
[im 18/21]
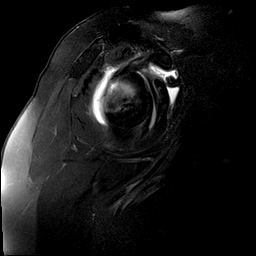
[im 21/21]
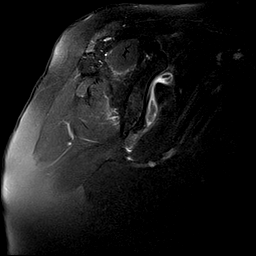

[Series 5: T2 fat-sat · axial · 4.0mm · 0.55mm/px · z∈[-80,+30]mm · 10 of 26 slices shown (2 of 3)]
[im 1/26]
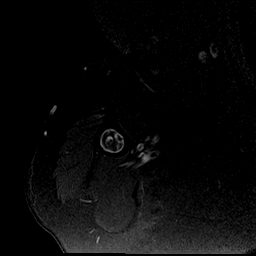
[im 3/26]
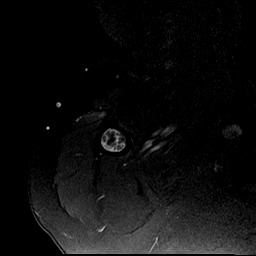
[im 6/26]
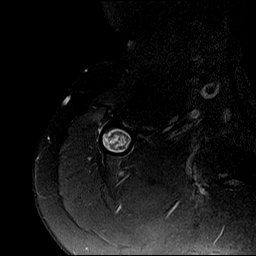
[im 9/26]
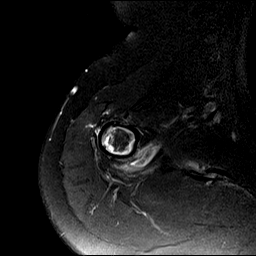
[im 12/26]
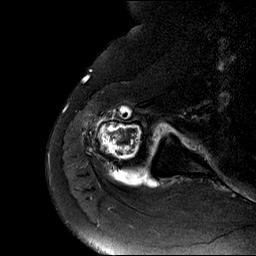
[im 14/26]
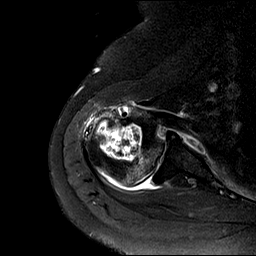
[im 17/26]
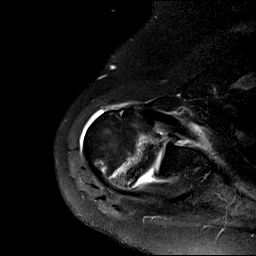
[im 20/26]
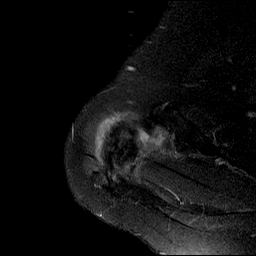
[im 23/26]
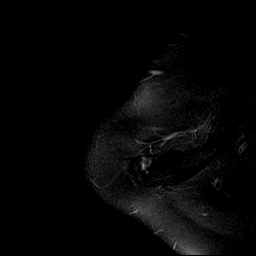
[im 26/26]
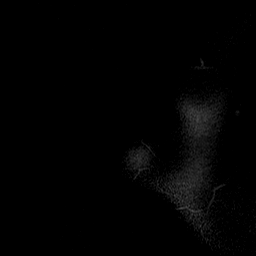

[Series 6: T1 · oblique · 4.0mm · 0.59mm/px · 8 of 21 slices shown]
[im 1/21]
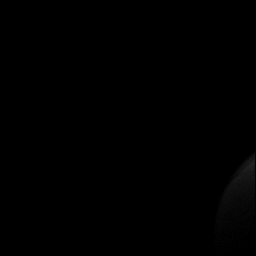
[im 3/21]
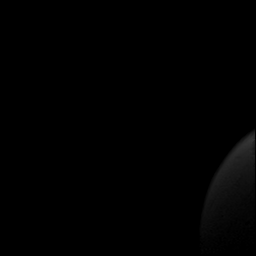
[im 6/21]
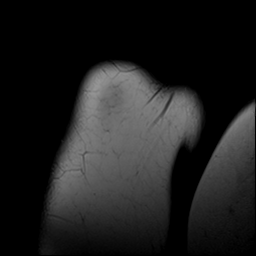
[im 9/21]
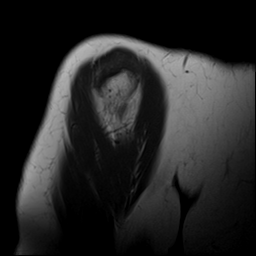
[im 12/21]
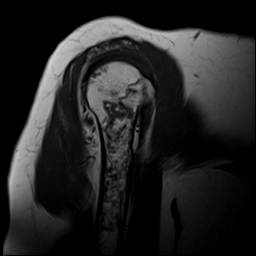
[im 15/21]
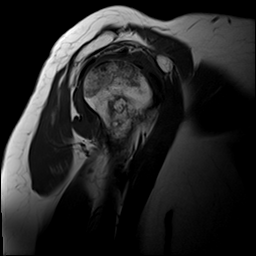
[im 18/21]
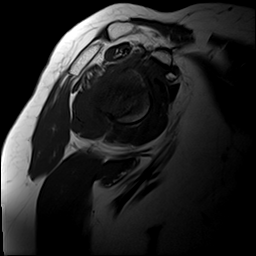
[im 21/21]
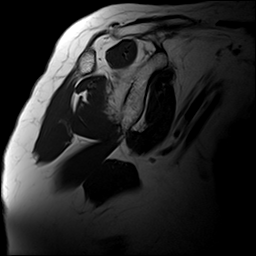

[Series 7: T2 fat-sat · oblique · 4.0mm · 0.59mm/px · 7 of 17 slices shown (3 of 3)]
[im 1/17]
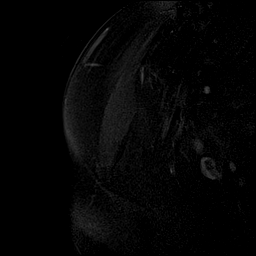
[im 3/17]
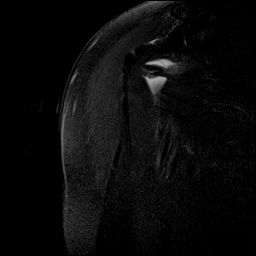
[im 6/17]
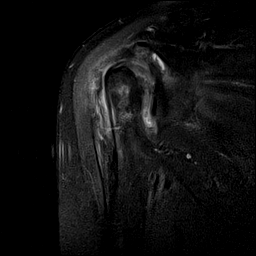
[im 9/17]
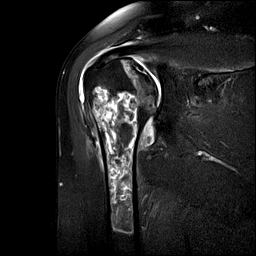
[im 11/17]
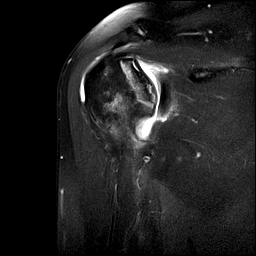
[im 14/17]
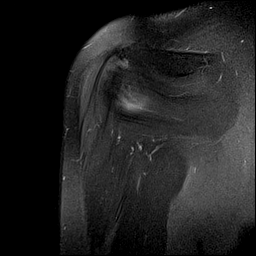
[im 17/17]
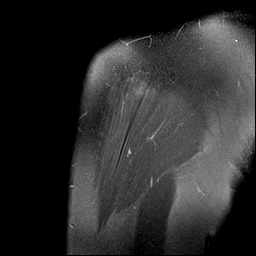

[Series 8: PD · oblique · 4.0mm · 0.59mm/px · 7 of 17 slices shown]
[im 1/17]
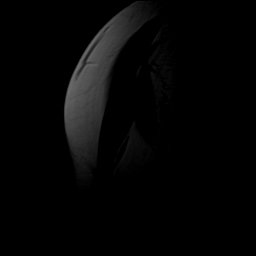
[im 3/17]
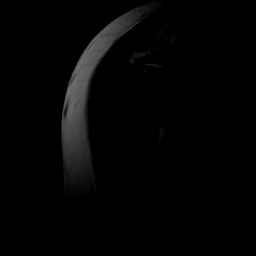
[im 6/17]
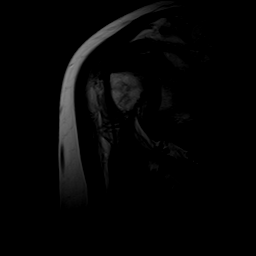
[im 9/17]
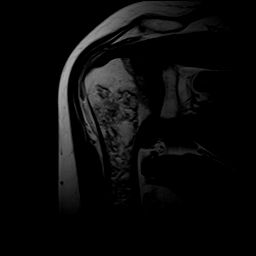
[im 11/17]
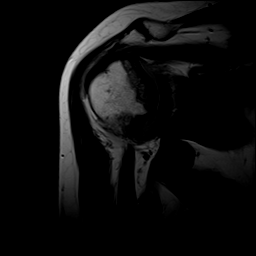
[im 14/17]
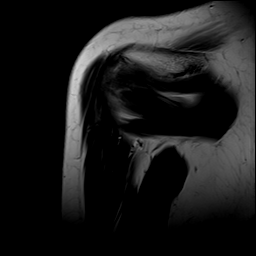
[im 17/17]
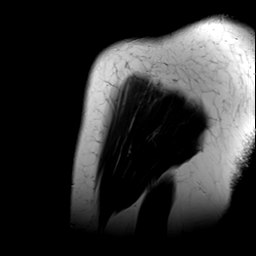

[40 of 40 positions shown; findings below may reference images not displayed]

FINDINGS: Rotator cuff: Moderate tendinosis of the supraspinatus tendon with a
possible small partial-thickness articular surface tear. Mild
tendinosis of the infraspinatus tendon. Teres minor tendon is
intact. Subscapularis tendon is intact.

Muscles: No atrophy or fatty replacement of nor abnormal signal
within, the muscles of the rotator cuff.

Biceps long head:  Intact.

Acromioclavicular Joint: Mild arthropathy of the acromioclavicular
joint. Type I acromion. Moderate subacromial/subdeltoid bursal
fluid.

Glenohumeral Joint: High-grade partial-thickness cartilage loss of
the glenohumeral joint. Moderate joint effusion with synovitis.

Labrum: Grossly intact, but evaluation is limited by lack of
intraarticular fluid.

Bones: Subchondral linear low signal with a subchondral cleft of
fluid signal involving the humeral head consistent with avascular
necrosis with surrounding marrow edema and partial articular surface
collapse. Large heterogeneous marrow signal abnormality in the
proximal humeral neck and diaphysis with interspersed area of T1
hyperintensity without endosteal scalloping, bony expansion, bone
destruction or periosteal reaction most consistent with a chronic
bone infarct.

Other: No fluid collection or hematoma.
IMPRESSION: 1. Moderate tendinosis of the supraspinatus tendon with a possible
small partial-thickness articular surface tear. Mild tendinosis of
the infraspinatus tendon.
2. Avascular necrosis of the right humeral head with surrounding
marrow edema and partial articular surface collapse.
3. Chronic bone infarct involving the proximal humeral metaphysis
and diaphysis.

## 2018-11-10 ENCOUNTER — Encounter (INDEPENDENT_AMBULATORY_CARE_PROVIDER_SITE_OTHER): Payer: Self-pay | Admitting: Orthopaedic Surgery

## 2018-11-10 ENCOUNTER — Ambulatory Visit (INDEPENDENT_AMBULATORY_CARE_PROVIDER_SITE_OTHER): Payer: BLUE CROSS/BLUE SHIELD

## 2018-11-10 ENCOUNTER — Ambulatory Visit (INDEPENDENT_AMBULATORY_CARE_PROVIDER_SITE_OTHER): Payer: BLUE CROSS/BLUE SHIELD | Admitting: Orthopaedic Surgery

## 2018-11-10 DIAGNOSIS — Z96643 Presence of artificial hip joint, bilateral: Secondary | ICD-10-CM

## 2018-11-10 NOTE — Progress Notes (Signed)
The patient is now 1 year status post bilateral total hip arthroplasties.  Since I have seen her she has had both her shoulders replaced.  She is doing well overall.  She does report some scar tissue on the right hip.  The right hip had an irrigation and debridement just over 4 weeks out from surgery due to cellulitis and wound breakdown with some adipose tissue necrosis.  There was never a deep infection and she was not treated with long-term antibiotics at all.  She says that overall she is not having any pain at all.  She denies any fever, chills, nausea or vomiting.  She still in therapy for her left shoulder.  That was done in November.  On exam I can put both hips through internal X rotation with no pain at all.  Both incisions at that nicely and there is no evidence of cellulitis or infection.  Her leg lengths are equal.  A low AP pelvis shows both total hip arthroplasties with no complicating features or evidence of loosening.  They do both appear bone ingrown.  At this point she will follow-up as needed for her hips.  I talked her in length about things that we need to bring her back for these hips.  Obviously we can see her for anything else.  She has been having some knee issues so if that flares up for hurting, we can see her.  All question concerns were answered and addressed.  Follow-up is otherwise as needed.

## 2019-07-01 ENCOUNTER — Other Ambulatory Visit: Payer: Self-pay | Admitting: Urology

## 2019-07-01 ENCOUNTER — Other Ambulatory Visit (HOSPITAL_COMMUNITY)
Admission: RE | Admit: 2019-07-01 | Discharge: 2019-07-01 | Disposition: A | Discharge status: Home / Self Care | Payer: BC Managed Care – PPO | Source: Ambulatory Visit | Attending: Urology | Admitting: Urology

## 2019-07-01 DIAGNOSIS — Z01812 Encounter for preprocedural laboratory examination: Secondary | ICD-10-CM | POA: Diagnosis present

## 2019-07-01 DIAGNOSIS — Z20828 Contact with and (suspected) exposure to other viral communicable diseases: Secondary | ICD-10-CM | POA: Insufficient documentation

## 2019-07-02 LAB — NOVEL CORONAVIRUS, NAA (HOSP ORDER, SEND-OUT TO REF LAB; TAT 18-24 HRS): SARS-CoV-2, NAA: NOT DETECTED

## 2019-07-04 ENCOUNTER — Encounter (HOSPITAL_BASED_OUTPATIENT_CLINIC_OR_DEPARTMENT_OTHER): Payer: Self-pay | Admitting: *Deleted

## 2019-07-04 ENCOUNTER — Other Ambulatory Visit: Payer: Self-pay

## 2019-07-04 NOTE — Progress Notes (Signed)
Spoke w/ via phone for pre-op interview--- PT Lab needs dos----  NONE COVID test ------ 07-01-2019 Arrive at ------- 0630 NPO after ------ [place] Medications to take morning of surgery ----- Synthroid, Prilosec Diabetic medication ----- n/a Patient Special Instructions ----- n/a Pre-Op special Istructions ----- n/a Patient verbalized understanding of instructions that were given at this phone interview. Patient denies shortness of breath, chest pain, fever, cough a this phone interview.

## 2019-07-05 ENCOUNTER — Ambulatory Visit (HOSPITAL_BASED_OUTPATIENT_CLINIC_OR_DEPARTMENT_OTHER)
Admission: RE | Admit: 2019-07-05 | Discharge: 2019-07-05 | Disposition: A | Discharge status: Home / Self Care | Payer: BC Managed Care – PPO | Attending: Urology | Admitting: Urology

## 2019-07-05 ENCOUNTER — Ambulatory Visit (HOSPITAL_BASED_OUTPATIENT_CLINIC_OR_DEPARTMENT_OTHER): Payer: BC Managed Care – PPO | Admitting: Anesthesiology

## 2019-07-05 ENCOUNTER — Encounter (HOSPITAL_BASED_OUTPATIENT_CLINIC_OR_DEPARTMENT_OTHER): Payer: Self-pay

## 2019-07-05 ENCOUNTER — Encounter (HOSPITAL_BASED_OUTPATIENT_CLINIC_OR_DEPARTMENT_OTHER)
Admission: RE | Disposition: A | Discharge status: Home / Self Care | Payer: Self-pay | Source: Home / Self Care | Attending: Urology

## 2019-07-05 DIAGNOSIS — N3010 Interstitial cystitis (chronic) without hematuria: Secondary | ICD-10-CM | POA: Diagnosis not present

## 2019-07-05 DIAGNOSIS — F419 Anxiety disorder, unspecified: Secondary | ICD-10-CM | POA: Diagnosis not present

## 2019-07-05 DIAGNOSIS — F329 Major depressive disorder, single episode, unspecified: Secondary | ICD-10-CM | POA: Diagnosis not present

## 2019-07-05 DIAGNOSIS — F17210 Nicotine dependence, cigarettes, uncomplicated: Secondary | ICD-10-CM | POA: Insufficient documentation

## 2019-07-05 DIAGNOSIS — R309 Painful micturition, unspecified: Secondary | ICD-10-CM | POA: Diagnosis present

## 2019-07-05 DIAGNOSIS — Z79890 Hormone replacement therapy (postmenopausal): Secondary | ICD-10-CM | POA: Diagnosis not present

## 2019-07-05 DIAGNOSIS — Z96643 Presence of artificial hip joint, bilateral: Secondary | ICD-10-CM | POA: Diagnosis not present

## 2019-07-05 DIAGNOSIS — N3946 Mixed incontinence: Secondary | ICD-10-CM | POA: Insufficient documentation

## 2019-07-05 DIAGNOSIS — Z79899 Other long term (current) drug therapy: Secondary | ICD-10-CM | POA: Diagnosis not present

## 2019-07-05 DIAGNOSIS — K219 Gastro-esophageal reflux disease without esophagitis: Secondary | ICD-10-CM | POA: Diagnosis not present

## 2019-07-05 DIAGNOSIS — E039 Hypothyroidism, unspecified: Secondary | ICD-10-CM | POA: Diagnosis not present

## 2019-07-05 HISTORY — PX: CYSTOSCOPY WITH HYDRODISTENSION AND BIOPSY: SHX5127

## 2019-07-05 SURGERY — CYSTOSCOPY, WITH BLADDER HYDRODISTENSION AND BIOPSY
Anesthesia: General | Site: Bladder

## 2019-07-05 MED ORDER — STERILE WATER FOR IRRIGATION IR SOLN
Status: DC | PRN
  Administered 2019-07-05: 3000 mL

## 2019-07-05 MED ORDER — CEFAZOLIN SODIUM-DEXTROSE 2-4 GM/100ML-% IV SOLN
INTRAVENOUS | Status: AC
  Filled 2019-07-05: qty 100

## 2019-07-05 MED ORDER — FENTANYL CITRATE (PF) 100 MCG/2ML IJ SOLN
INTRAMUSCULAR | Status: AC
  Filled 2019-07-05: qty 2

## 2019-07-05 MED ORDER — KETOROLAC TROMETHAMINE 15 MG/ML IJ SOLN
INTRAMUSCULAR | Status: DC | PRN
  Administered 2019-07-05: 15 mg via INTRAVENOUS

## 2019-07-05 MED ORDER — LIDOCAINE 2% (20 MG/ML) 5 ML SYRINGE
INTRAMUSCULAR | Status: AC
  Filled 2019-07-05: qty 5

## 2019-07-05 MED ORDER — SODIUM CHLORIDE 0.9% FLUSH
3.0000 mL | INTRAVENOUS | Status: DC | PRN
  Filled 2019-07-05: qty 3

## 2019-07-05 MED ORDER — MIDAZOLAM HCL 5 MG/5ML IJ SOLN
INTRAMUSCULAR | Status: DC | PRN
  Administered 2019-07-05: 2 mg via INTRAVENOUS

## 2019-07-05 MED ORDER — ACETAMINOPHEN 325 MG PO TABS
650.0000 mg | ORAL_TABLET | ORAL | Status: DC | PRN
  Filled 2019-07-05: qty 2

## 2019-07-05 MED ORDER — LIDOCAINE 2% (20 MG/ML) 5 ML SYRINGE
INTRAMUSCULAR | Status: DC | PRN
  Administered 2019-07-05: 50 mg via INTRAVENOUS

## 2019-07-05 MED ORDER — MIDAZOLAM HCL 2 MG/2ML IJ SOLN
INTRAMUSCULAR | Status: AC
  Filled 2019-07-05: qty 2

## 2019-07-05 MED ORDER — ACETAMINOPHEN 500 MG PO TABS
ORAL_TABLET | ORAL | Status: AC
  Filled 2019-07-05: qty 2

## 2019-07-05 MED ORDER — DEXAMETHASONE SODIUM PHOSPHATE 10 MG/ML IJ SOLN
INTRAMUSCULAR | Status: AC
  Filled 2019-07-05: qty 1

## 2019-07-05 MED ORDER — SODIUM CHLORIDE 0.9% FLUSH
3.0000 mL | Freq: Two times a day (BID) | INTRAVENOUS | Status: DC
  Filled 2019-07-05: qty 3

## 2019-07-05 MED ORDER — PROPOFOL 10 MG/ML IV BOLUS
INTRAVENOUS | Status: DC | PRN
  Administered 2019-07-05: 150 mg via INTRAVENOUS

## 2019-07-05 MED ORDER — ACETAMINOPHEN 500 MG PO TABS
1000.0000 mg | ORAL_TABLET | Freq: Once | ORAL | Status: AC
  Administered 2019-07-05: 08:00:00 1000 mg via ORAL
  Filled 2019-07-05: qty 2

## 2019-07-05 MED ORDER — LACTATED RINGERS IV SOLN
INTRAVENOUS | Status: DC
  Administered 2019-07-05: 07:00:00 via INTRAVENOUS
  Filled 2019-07-05: qty 1000

## 2019-07-05 MED ORDER — PHENYLEPHRINE 40 MCG/ML (10ML) SYRINGE FOR IV PUSH (FOR BLOOD PRESSURE SUPPORT)
PREFILLED_SYRINGE | INTRAVENOUS | Status: AC
  Filled 2019-07-05: qty 10

## 2019-07-05 MED ORDER — ONDANSETRON HCL 4 MG/2ML IJ SOLN
INTRAMUSCULAR | Status: AC
  Filled 2019-07-05: qty 2

## 2019-07-05 MED ORDER — OXYCODONE HCL 5 MG PO TABS
5.0000 mg | ORAL_TABLET | ORAL | Status: DC | PRN
  Administered 2019-07-05: 5 mg via ORAL
  Filled 2019-07-05: qty 2

## 2019-07-05 MED ORDER — DEXAMETHASONE SODIUM PHOSPHATE 4 MG/ML IJ SOLN
INTRAMUSCULAR | Status: DC | PRN
  Administered 2019-07-05: 10 mg via INTRAVENOUS

## 2019-07-05 MED ORDER — FENTANYL CITRATE (PF) 100 MCG/2ML IJ SOLN
INTRAMUSCULAR | Status: DC | PRN
  Administered 2019-07-05: 25 ug via INTRAVENOUS
  Administered 2019-07-05: 50 ug via INTRAVENOUS

## 2019-07-05 MED ORDER — CEFAZOLIN SODIUM-DEXTROSE 2-4 GM/100ML-% IV SOLN
2.0000 g | INTRAVENOUS | Status: DC
  Filled 2019-07-05: qty 100

## 2019-07-05 MED ORDER — MORPHINE SULFATE (PF) 2 MG/ML IV SOLN
2.0000 mg | INTRAVENOUS | Status: DC | PRN
  Filled 2019-07-05: qty 1

## 2019-07-05 MED ORDER — PHENYLEPHRINE 40 MCG/ML (10ML) SYRINGE FOR IV PUSH (FOR BLOOD PRESSURE SUPPORT)
PREFILLED_SYRINGE | INTRAVENOUS | Status: DC | PRN
  Administered 2019-07-05: 80 ug via INTRAVENOUS

## 2019-07-05 MED ORDER — PROPOFOL 10 MG/ML IV BOLUS
INTRAVENOUS | Status: AC
  Filled 2019-07-05: qty 20

## 2019-07-05 MED ORDER — SODIUM CHLORIDE 0.9 % IV SOLN
250.0000 mL | INTRAVENOUS | Status: DC | PRN
  Filled 2019-07-05: qty 250

## 2019-07-05 MED ORDER — KETOROLAC TROMETHAMINE 30 MG/ML IJ SOLN
INTRAMUSCULAR | Status: AC
  Filled 2019-07-05: qty 1

## 2019-07-05 MED ORDER — ACETAMINOPHEN 650 MG RE SUPP
650.0000 mg | RECTAL | Status: DC | PRN
  Filled 2019-07-05: qty 1

## 2019-07-05 MED ORDER — OXYCODONE HCL 5 MG PO TABS
ORAL_TABLET | ORAL | Status: AC
  Filled 2019-07-05: qty 1

## 2019-07-05 MED ORDER — PHENAZOPYRIDINE HCL 200 MG PO TABS
ORAL | Status: DC | PRN
  Administered 2019-07-05: 09:00:00 via INTRAVESICAL

## 2019-07-05 MED ORDER — FENTANYL CITRATE (PF) 100 MCG/2ML IJ SOLN
25.0000 ug | INTRAMUSCULAR | Status: DC | PRN
  Administered 2019-07-05: 25 ug via INTRAVENOUS
  Filled 2019-07-05: qty 1

## 2019-07-05 MED ORDER — ONDANSETRON HCL 4 MG/2ML IJ SOLN
INTRAMUSCULAR | Status: DC | PRN
  Administered 2019-07-05: 4 mg via INTRAVENOUS

## 2019-07-05 MED ORDER — HYDROCODONE-ACETAMINOPHEN 5-325 MG PO TABS: 1.0000 | ORAL_TABLET | Freq: Four times a day (QID) | ORAL | 0 refills | Status: DC | PRN

## 2019-07-05 SURGICAL SUPPLY — 24 items
BAG DRAIN URO-CYSTO SKYTR STRL (DRAIN) ×3 IMPLANT
CATH ROBINSON RED A/P 16FR (CATHETERS) ×3 IMPLANT
CLOTH BEACON ORANGE TIMEOUT ST (SAFETY) ×3 IMPLANT
ELECT REM PT RETURN 9FT ADLT (ELECTROSURGICAL)
ELECTRODE REM PT RTRN 9FT ADLT (ELECTROSURGICAL) ×1 IMPLANT
GLOVE BIO SURGEON STRL SZ 6.5 (GLOVE) ×1 IMPLANT
GLOVE BIO SURGEONS STRL SZ 6.5 (GLOVE) ×1
GLOVE BIOGEL PI IND STRL 6.5 (GLOVE) IMPLANT
GLOVE BIOGEL PI INDICATOR 6.5 (GLOVE) ×2
GLOVE SURG SS PI 8.0 STRL IVOR (GLOVE) ×3 IMPLANT
GOWN STRL REUS W/ TWL LRG LVL3 (GOWN DISPOSABLE) IMPLANT
GOWN STRL REUS W/TWL LRG LVL3 (GOWN DISPOSABLE) ×2
GOWN STRL REUS W/TWL XL LVL3 (GOWN DISPOSABLE) ×3 IMPLANT
KIT TURNOVER CYSTO (KITS) ×3 IMPLANT
MANIFOLD NEPTUNE II (INSTRUMENTS) ×2 IMPLANT
NDL SAFETY ECLIPSE 18X1.5 (NEEDLE) IMPLANT
NEEDLE HYPO 18GX1.5 SHARP (NEEDLE) ×2
NS IRRIG 500ML POUR BTL (IV SOLUTION) IMPLANT
PACK CYSTO (CUSTOM PROCEDURE TRAY) ×3 IMPLANT
PLUG CATH AND CAP STER (CATHETERS) IMPLANT
SYR 30ML LL (SYRINGE) ×3 IMPLANT
TUBE CONNECTING 12'X1/4 (SUCTIONS) ×1
TUBE CONNECTING 12X1/4 (SUCTIONS) ×1 IMPLANT
WATER STERILE IRR 3000ML UROMA (IV SOLUTION) ×3 IMPLANT

## 2019-07-05 NOTE — Discharge Instructions (Addendum)
CYSTOSCOPY HOME CARE INSTRUCTIONS  Activity: Rest for the remainder of the day.  Do not drive or operate equipment today.  You may resume normal activities in one to two days as instructed by your physician.   Meals: Drink plenty of liquids and eat light foods such as gelatin or soup this evening.  You may return to a normal meal plan tomorrow.  Return to Work: You may return to work in one to two days or as instructed by your physician.  Special Instructions / Symptoms: Call your physician if any of these symptoms occur:   -persistent or heavy bleeding  -bleeding which continues after first few urination  -large blood clots that are difficult to pass  -urine stream diminishes or stops completely  -fever equal to or higher than 101 degrees Farenheit.  -cloudy urine with a strong, foul odor  -severe pain  Females should always wipe from front to back after elimination.  You may feel some burning pain when you urinate.  This should disappear with time.  Applying moist heat to the lower abdomen or a hot tub bath may help relieve the pain.  Avoid motrin,Aleve until 3 pm  Post Anesthesia Home Care Instructions  Activity: Get plenty of rest for the remainder of the day. A responsible individual must stay with you for 24 hours following the procedure.  For the next 24 hours, DO NOT: -Drive a car -Paediatric nurse -Drink alcoholic beverages -Take any medication unless instructed by your physician -Make any legal decisions or sign important papers.  Meals: Start with liquid foods such as gelatin or soup. Progress to regular foods as tolerated. Avoid greasy, spicy, heavy foods. If nausea and/or vomiting occur, drink only clear liquids until the nausea and/or vomiting subsides. Call your physician if vomiting continues.  Special Instructions/Symptoms: Your throat may feel dry or sore from the anesthesia or the breathing tube placed in your throat during surgery. If this causes  discomfort, gargle with warm salt water. The discomfort should disappear within 24 hours.  If you had a scopolamine patch placed behind your ear for the management of post- operative nausea and/or vomiting:  1. The medication in the patch is effective for 72 hours, after which it should be removed.  Wrap patch in a tissue and discard in the trash. Wash hands thoroughly with soap and water. 2. You may remove the patch earlier than 72 hours if you experience unpleasant side effects which may include dry mouth, dizziness or visual disturbances. 3. Avoid touching the patch. Wash your hands with soap and water after contact with the patch.

## 2019-07-05 NOTE — H&P (Signed)
CC: I have pain or burning with urination.  HPI: Kaitlyn [name] is a 63 year-old female established patient who is here for pain or burning while urinating.  She does have pain or burning when she urinates.   Kaitlyn [name] returns today in fu with the complaint of bladder pain. She has a long history of IC and last had HOD in 2018. She was supposed to have a cystoscopy with HOD earlier this year but that got cancelled because of a medical issue with her husband. She pain in the bladder with urgency and after voiding she has stranguria. She has had no hematuria. She has had no fever. She has some incontinence with coughing. She has some increased frequency and nocturia 3-4x. The symptoms are similar to her prior IC symptoms. She had Mx species on her last UA in June.      ALLERGIES: Sulfa Drugs - Skin Rash    MEDICATIONS: Levothyroxine Sodium 100 mcg tablet  Depakote  Lexapro 20 mg tablet  Pyridium 200 mg tablet 1 tablet PO TID PRN  Seroquel 200 mg tablet     GU PSH: Cystoscopy Hydrodistention - 2018, 2017, 2015, 2015, 2014, 2013, 2011, 2010, 2009 Hysterectomy Unilat SO - 2009     NON-GU PSH: Anesth, Shoulder Replacement Hip Replacement, Bilateral Remove Tonsils - 2009     GU PMH: Painful micturition, Unspec, She has symptoms that could be from cystitis or recurrent IC. I will send a culture and start macrobid pending the culture. If the culture is negative, she will probably need anesthetic cocktails or a repeat HOD. - 11/29/2018 Subacute and chronic vaginitis, She has vaginal itching with prior improvement with fluconazole. I have sent a script for that. She has tolerated it in the past while on her current antidepressants. - 11/29/2018 Interstitial Cystitis (w/o hematuria) (Worsening), She has increased pain from her IC and feels she needs another dilation. She has actually gone a fairly long time for her. I will get her set up for the procedure but sent a culture to make sure she doesn't have  infection. I reviewed the risks of the procedure in detail. - 2018, Chronic interstitial cystitis without hematuria, - 2017, Chronic Interstitial Cystitis, - 2014 Microscopic hematuria (Stable, Chronic), minimal stable and chronic. - 2018 Mixed incontinence (Stable), She continues to have mild incontinence. - 2018 Other microscopic hematuria, Microscopic hematuria - 2017 Oth GU systems Signs/Symptoms, Bladder pain - 2017 Urge incontinence, Urge incontinence of urine - 2017 Urinary Retention, Unspec, Acute Urinary Retention - 2014      PMH Notes:  2006-11-06 15:32:12 - Note: Arthritis   NON-GU PMH: Encounter for general adult medical examination without abnormal findings, Encounter for preventive health examination - 2017 Anxiety, Anxiety - 2014 Personal history of other diseases of the digestive system, History of esophageal reflux - 2014 Personal history of other endocrine, nutritional and metabolic disease, History of hypercholesterolemia - 2014, History of hypothyroidism, - 2014 Personal history of other mental and behavioral disorders, History of depression - 2014    FAMILY HISTORY: Breast Cancer - [place] Status Number - Runs In Family Lung Cancer - Father, Mother nephrolithiasis - Brother   SOCIAL HISTORY: Marital Status: Married Current Smoking Status: Patient smokes. Smokes 1/2 pack per day.   Tobacco Use Assessment Completed: Used Tobacco in last 30 days?    REVIEW OF SYSTEMS:    GU Review Female:   Patient denies frequent urination, hard to postpone urination, burning /pain with urination, get up at  night to urinate, leakage of urine, stream starts and stops, trouble starting your stream, have to strain to urinate, and being pregnant.  Gastrointestinal (Upper):   Patient denies nausea, vomiting, and indigestion/ heartburn.  Gastrointestinal (Lower):   Patient denies diarrhea and constipation.  Constitutional:   Patient denies fever, night sweats,  weight loss, and fatigue.  Skin:   Patient denies skin rash/ lesion and itching.  Eyes:   Patient denies blurred vision and double vision.  Ears/ Nose/ Throat:   Patient denies sinus problems and sore throat.  Hematologic/Lymphatic:   Patient denies swollen glands and easy bruising.  Cardiovascular:   Patient denies leg swelling and chest pains.  Respiratory:   Patient denies cough and shortness of breath.  Endocrine:   Patient denies excessive thirst.  Musculoskeletal:   Patient denies back pain and joint pain.  Neurological:   Patient denies headaches and dizziness.  Psychologic:   Patient denies depression and anxiety.   Notes: leakage, bladder pain    VITAL SIGNS:      06/27/2019 01:57 PM  BP 159/96 mmHg  Heart Rate 81 /min  Temperature 97.7 F / 36.5 C   MULTI-SYSTEM PHYSICAL EXAMINATION:    Constitutional: Well-nourished. No physical deformities. Normally developed. Good grooming.  Respiratory: Normal breath sounds. No labored breathing, no use of accessory muscles.   Cardiovascular: Regular rate and rhythm. No murmur, no gallop.      PAST DATA REVIEWED:  Source Of History:  Patient  Urine Test Review:   Urinalysis   PROCEDURES:          Urinalysis w/Scope Dipstick Dipstick Cont'd Micro  Color: Yellow Bilirubin: Neg mg/dL WBC/hpf: NS (Not Seen)  Appearance: Clear Ketones: Neg mg/dL RBC/hpf: 0 - 2/hpf  Specific Gravity: 1.020 Blood: Trace ery/uL Bacteria: Few (10-25/hpf)  pH: 5.5 Protein: Neg mg/dL Cystals: NS (Not Seen)  Glucose: Neg mg/dL Urobilinogen: 1.0 mg/dL Casts: NS (Not Seen)    Nitrites: Neg Trichomonas: Not Present    Leukocyte Esterase: Neg leu/uL Mucous: Not Present      Epithelial Cells: 10 - 20/hpf      Yeast: NS (Not Seen)      Sperm: Not Present    ASSESSMENT:      ICD-10 Details  1 GU:   Painful micturition, Unspec - R30.9 Worsening - She has recurrent symptoms from her IC and would like to proceed with cystoscopy and HOD. She is well aware of  the risks.   2   Interstitial Cystitis (w/o hematuria) - N30.10   3   Mixed incontinence - N39.46 She has stable incontinence.    PLAN:           Schedule Return Visit/Planned Activity: Next Available Appointment - Schedule Surgery          Document Letter(s):  Created for Patient: Clinical Summary

## 2019-07-05 NOTE — Anesthesia Postprocedure Evaluation (Signed)
Anesthesia Post Note  Patient: Kaitlyn [name]  Procedure(s) Performed: CYSTOSCOPY, HYDRODISTENSION/ INSTILATION MARCAINE AND PYRIDIUM (N/A Bladder)     Patient location during evaluation: PACU Anesthesia Type: General Level of consciousness: awake and alert Pain management: pain level controlled Vital Signs Assessment: post-procedure vital signs reviewed and stable Respiratory status: spontaneous breathing, nonlabored ventilation, respiratory function stable and patient connected to nasal cannula oxygen Cardiovascular status: blood pressure returned to baseline and stable Postop Assessment: no apparent nausea or vomiting Anesthetic complications: no    Last Vitals:  Vitals:   07/05/19 0945 07/05/19 1000  BP: 127/84 119/78  Pulse: 85 86  Resp: 14 10  Temp:    SpO2: 95% 96%    Last Pain:  Vitals:   07/05/19 1000  TempSrc:   PainSc: 4                  Chelsey L Woodrum

## 2019-07-05 NOTE — Anesthesia Procedure Notes (Signed)
Procedure Name: LMA Insertion Date/Time: 07/05/2019 8:52 AM Performed by: Mechele Claude, CRNA Pre-anesthesia Checklist: Patient identified, Emergency Drugs available, Suction available and Patient being monitored Patient Re-evaluated:Patient Re-evaluated prior to induction Oxygen Delivery Method: Circle system utilized Preoxygenation: Pre-oxygenation with 100% oxygen Induction Type: IV induction Ventilation: Mask ventilation without difficulty LMA: LMA inserted LMA Size: 4.0 Number of attempts: 1 Airway Equipment and Method: Bite block Placement Confirmation: positive ETCO2 Tube secured with: Tape Dental Injury: Teeth and Oropharynx as per pre-operative assessment

## 2019-07-05 NOTE — Anesthesia Preprocedure Evaluation (Addendum)
Anesthesia Evaluation  Patient identified by MRN, date of birth, ID band Patient awake    Reviewed: Allergy & Precautions, NPO status , Patient's Chart, lab work & pertinent test results  History of Anesthesia Complications (+) PONV and history of anesthetic complications  Airway Mallampati: III  TM Distance: >3 FB Neck ROM: Full  Mouth opening: Limited Mouth Opening  Dental  (+) Edentulous Upper, Dental Advisory Given   Pulmonary neg pulmonary ROS, Current Smoker,    Pulmonary exam normal breath sounds clear to auscultation       Cardiovascular Normal cardiovascular exam Rhythm:Regular Rate:Normal  HLD   Neuro/Psych  Headaches, PSYCHIATRIC DISORDERS Depression    GI/Hepatic Neg liver ROS, GERD  Controlled and Medicated,  Endo/Other  Hypothyroidism   Renal/GU negative Renal ROS  negative genitourinary   Musculoskeletal  (+) Arthritis ,   Abdominal   Peds  Hematology negative hematology ROS (+)   Anesthesia Other Findings   Reproductive/Obstetrics                            Anesthesia Physical Anesthesia Plan  ASA: II  Anesthesia Plan: General   Post-op Pain Management:    Induction: Intravenous  PONV Risk Score and Plan: Ondansetron, Dexamethasone and Midazolam  Airway Management Planned: LMA  Additional Equipment:   Intra-op Plan:   Post-operative Plan: Extubation in OR  Informed Consent: I have reviewed the patients History and Physical, chart, labs and discussed the procedure including the risks, benefits and alternatives for the proposed anesthesia with the patient or authorized representative who has indicated his/her understanding and acceptance.     Dental advisory given  Plan Discussed with: CRNA  Anesthesia Plan Comments:         Anesthesia Quick Evaluation

## 2019-07-05 NOTE — Transfer of Care (Signed)
  Last Vitals:  Vitals Value Taken Time  BP 148/118 07/05/19 0921  Temp    Pulse 94 07/05/19 0924  Resp 13 07/05/19 0924  SpO2 95 % 07/05/19 0924  Vitals shown include unvalidated device data.  Last Pain:  Vitals:   07/05/19 0650  TempSrc: Oral  PainSc: 4       Patients Stated Pain Goal: 5 (07/05/19 0650) Immediate Anesthesia Transfer of Care Note  Patient: Kaitlyn [name]  Procedure(s) Performed: Procedure(s) (LRB): CYSTOSCOPY, HYDRODISTENSION/ INSTILATION MARCAINE AND PYRIDIUM (N/A)  Patient Location: PACU  Anesthesia Type: General  Level of Consciousness:drowsy  Airway & Oxygen Therapy: Patient Spontanous Breathing and Patient connected to nasal cannula oxygen  Post-op Assessment: Report given to PACU RN and Post -op Vital signs reviewed and stable.  OAW removed in PACU Maintaining own airway.  Post vital signs: Reviewed and stable  Complications: No apparent anesthesia complications

## 2019-07-05 NOTE — Op Note (Signed)
Procedure: 1.  Cystoscopy with hydrodistention of the bladder. 2.  Instillation of Pyridium and Marcaine.  Preop diagnosis: Interstitial cystitis.  Postop diagnosis: Same.  Surgeon: Dr. Irine Seal.  Anesthesia: General.  Specimen: None.  Drain: None.  EBL: None.  Complications: None.  Indications: The patient is a 63 year old female with a long history of interstitial cystitis who presented recently with recurrent symptoms and requested repeat hydrodistention of the bladder.  Procedure: She was taken the operating room where she was given Ancef.  A general anesthetic was induced.  She placed in the lithotomy position and was fitted with PAS hose.  Her perineum and genitalia were prepped with Betadine solution and she was draped in usual sterile fashion.  Cystoscopy was performed using a 23 [place] scope and 30 degree lens.  Examination revealed a normal urethra.  The bladder wall had mild trabeculation with no mucosal lesions.  Ureteral orifices were unremarkable.  The bladder was then filled twice to 80 cm of water pressure and drained.  Her capacity under anesthesia was 550 and 600 mL on sequential dilations.  Repeat cystoscopy following hydrodistention revealed few glomerular hemorrhages consistent with the diagnosis of interstitial cystitis.  Her bladder was then drained and the cystoscope was removed.  A 16 French red rubber catheter was inserted and the bladder was instilled with 40 mg of crushed Pyridium and 15 mL of 0.5% Marcaine.  The cath was removed.  She was taken down from lithotomy position, her anesthetic was reversed and she was moved to recovery in stable condition.  There were no complications.

## 2019-07-06 ENCOUNTER — Encounter (HOSPITAL_BASED_OUTPATIENT_CLINIC_OR_DEPARTMENT_OTHER): Payer: Self-pay | Admitting: Urology

## 2019-07-06 NOTE — Progress Notes (Signed)
Pt states she is nauseated this morning. Has not thrown up just belching and nauseated. Pt has not eaten breakfast today, not taken any pain meds this morning, denies pain at present. Encouraged pt to try to eat breakfast and if nausea persists call MD office for recommendations.

## 2019-10-19 ENCOUNTER — Encounter: Payer: Self-pay | Admitting: Orthopaedic Surgery

## 2019-10-19 ENCOUNTER — Ambulatory Visit: Payer: Medicare HMO | Admitting: Orthopaedic Surgery

## 2019-10-19 ENCOUNTER — Other Ambulatory Visit: Payer: Self-pay

## 2019-10-19 ENCOUNTER — Ambulatory Visit: Payer: Self-pay

## 2019-10-19 ENCOUNTER — Ambulatory Visit (INDEPENDENT_AMBULATORY_CARE_PROVIDER_SITE_OTHER): Payer: Medicare HMO

## 2019-10-19 VITALS — Ht 59.5 in | Wt 150.0 lb

## 2019-10-19 DIAGNOSIS — M1711 Unilateral primary osteoarthritis, right knee: Secondary | ICD-10-CM

## 2019-10-19 DIAGNOSIS — M1712 Unilateral primary osteoarthritis, left knee: Secondary | ICD-10-CM | POA: Diagnosis not present

## 2019-10-19 DIAGNOSIS — G8929 Other chronic pain: Secondary | ICD-10-CM | POA: Diagnosis not present

## 2019-10-19 DIAGNOSIS — M25562 Pain in left knee: Secondary | ICD-10-CM | POA: Diagnosis not present

## 2019-10-19 DIAGNOSIS — M25561 Pain in right knee: Secondary | ICD-10-CM

## 2019-10-19 NOTE — Progress Notes (Signed)
Office Visit Note   Patient: Kaitlyn [name]           Date of Birth: 30-Oct-1955           MRN: [id number] Visit Date: 10/19/2019              Requested by: Hadley Pen, MD [address] [place],  [place] [address] PCP: Hadley Pen, MD   Assessment & Plan: Visit Diagnoses:  1. Chronic pain of left knee   2. Chronic pain of right knee   3. Primary osteoarthritis of right knee   4. Primary osteoarthritis of left knee     Plan: Patient is having debilitating pain that is affecting her daily activities.  Despite conservative measures she continues to have pain.  She is has end-stage tricompartmental arthritis of both knees. Discussed with her which knee is bothering her the most and the fact that we would not want to do bilateral knee replacements due to possible complications.  Therefore she feels she would like to start with the left knee.  Risk benefits of surgery were discussed by Dr. Magnus Ivan and myself with the patient.  Risks include but are not limited to prolonged pain, nerve/vessel injury, PE/DVT, and infection. Will proceed with surgery in the near future.  Should follow-up with [place] 2 weeks postop.  Follow-Up Instructions: No follow-ups on file.   Orders:  Orders Placed This Encounter  Procedures   XR Knee 1-2 Views Left   XR Knee 1-2 Views Right   No orders of the defined types were placed in this encounter.     Procedures: No procedures performed   Clinical Data: No additional findings.   Subjective: Chief Complaint  Patient presents with   Left Knee - Pain   Right Knee - Pain    HPI  Kaitlyn [name] returns today due to bilateral knee pain.  She states that both knees bother her and greatly inhibit her from walking prolonged period of time.  She had no acute injury.  She ambulates with a cane in her left hand.  History of bilateral hip arthroplasties 11/06/2017 she reports both hips doing well.  She has had knee pain for some period of time  and is wanting to discuss knee replacement.  Review of Systems  Constitutional: Negative for chills and fever.  Respiratory: Negative for shortness of breath.   Cardiovascular: Negative for chest pain.  Musculoskeletal: Positive for arthralgias and back pain.     Objective: Vital Signs: Ht 4' 11.5" (1.511 m)    Wt 150 lb (68 kg)    BMI 29.79 kg/m   Physical Exam Constitutional:      Appearance: She is not ill-appearing or diaphoretic.  Pulmonary:     Effort: Pulmonary effort is normal.  Neurological:     Mental Status: She is alert and oriented to person, place, and time.  Psychiatric:        Mood and Affect: Mood normal.     Ortho Exam Bilateral knees good range of motion.  Right knee positive patellofemoral crepitus.  No instability valgus varus stressing of either knee.  No abnormal warmth erythema or effusion of either knee.  Calves are supple and nontender bilaterally. Specialty Comments:  No specialty comments available.  Imaging: XR Knee 1-2 Views Left  Result Date: 10/19/2019 Left knee 2 views: No acute fracture.  Knee is well located.  Tricompartmental arthritic changes with near bone-on-bone medial compartment.  XR Knee 1-2 Views Right  Result Date: 10/19/2019 AP lateral views right knee: No acute fracture.  Tricompartmental arthritis with bone-on-bone medial compartment near bone-on-bone lateral compartment.  Severe patellofemoral arthritic changes.  Knee is well located.    PMFS History: Patient Active Problem List   Diagnosis Date Noted   S/P shoulder replacement, left 08/05/2018   Status post total shoulder arthroplasty, right 04/15/2018   Cellulitis of right hip 01/01/2018   Avascular necrosis of bones of both hips ([place]) 11/06/2017   Status post bilateral total hip replacement 11/06/2017   Bilateral hip pain 08/31/2017   Avascular necrosis of hip, left ([place]) 08/31/2017   Avascular necrosis of hip, right ([place]) 08/31/2017   Past Medical  History:  Diagnosis Date   Arthritis    oa   Depression    Frequency of urination    GERD (gastroesophageal reflux disease)    CONTROLLED W/ PRILOSEC   Headache    hx of migraines   Hyperlipemia    Hypothyroidism    Incontinence of urine    Insomnia    Interstitial cystitis    Pelvic pain syndrome I.C.   PONV (postoperative nausea and vomiting)    Urgency of urination    Wears dentures    UPPER   Wears glasses     No family history on file.  Past Surgical History:  Procedure Laterality Date   BILATERAL ANTERIOR TOTAL HIP ARTHROPLASTY Bilateral 11/06/2017   Procedure: BILATERAL TOTAL HIP ARTHROPLASTY ANTERIOR APPROACH;  Surgeon: Mcarthur Rossetti, MD;  Location: [facility];  Service: Orthopedics;  Laterality: Bilateral;   CYSTO WITH HYDRODISTENSION  09/18/2011   Procedure: CYSTOSCOPY/HYDRODISTENSION;  Surgeon: Malka So;  Location: [place];  Service: Urology;  Laterality: N/A;  Instillation of Marcaine & Pyridium   CYSTO WITH HYDRODISTENSION N/A 05/31/2013   Procedure: CYSTOSCOPY/HYDRODISTENSION INSTALLATION OF MARCAINE/PYRIDIUM;  Surgeon: Fredricka Bonine, MD;  Location: [facility];  Service: Urology;  Laterality: N/A;   CYSTO WITH HYDRODISTENSION N/A 01/24/2014   Procedure: CYSTOSCOPY/HYDRODISTENSION INSTALLATION OF MARCAINE AND PYRIDIUM;  Surgeon: Malka So, MD;  Location: [facility];  Service: Urology;  Laterality: N/A;   CYSTO WITH HYDRODISTENSION N/A 08/03/2014   Procedure: CYSTO/HYDRODISTENSION OF BLADDER/INSTILL  PYRIDIUM/MARCAINE;  Surgeon: Malka So, MD;  Location: [facility];  Service: Urology;  Laterality: N/A;   CYSTO WITH HYDRODISTENSION N/A 11/08/2015   Procedure: CYSTOSCOPY/HYDRODISTENSION;  Surgeon: Irine Seal, MD;  Location: [facility];  Service: Urology;  Laterality: N/A;   CYSTO WITH HYDRODISTENSION N/A 12/11/2016   Procedure:  CYSTOSCOPY/HYDRODISTENSION;  Surgeon: Irine Seal, MD;  Location: [facility];  Service: Urology;  Laterality: N/A;   CYSTO/ HOD/ INSTILLATION THERAPY  LAST ONE 08-27-2010   MULTIPLE TIMES   CYSTOSCOPY WITH HYDRODISTENSION AND BIOPSY N/A 07/05/2019   Procedure: CYSTOSCOPY, HYDRODISTENSION/ INSTILATION MARCAINE AND PYRIDIUM;  Surgeon: Irine Seal, MD;  Location: [facility];  Service: Urology;  Laterality: N/A;   INCISION AND DRAINAGE HIP Right 01/01/2018   Procedure: IRRIGATION AND DEBRIDEMENT RIGHT HIP INCISION;  Surgeon: Mcarthur Rossetti, MD;  Location: [facility];  Service: Orthopedics;  Laterality: Right;   JOINT REPLACEMENT     bilateral hip c. blackman 11-06-17   TONSILLECTOMY     TOTAL SHOULDER ARTHROPLASTY Right 04/15/2018   TOTAL SHOULDER ARTHROPLASTY Right 04/15/2018   Procedure: TOTAL SHOULDER ARTHROPLASTY;  Surgeon: Tania Ade, MD;  Location: [place];  Service: Orthopedics;  Laterality: Right;   TOTAL SHOULDER ARTHROPLASTY Left 08/05/2018  Procedure: LEFT TOTAL SHOULDER ARTHROPLASTY;  Surgeon: Jones Broom, MD;  Location: [facility];  Service: Orthopedics;  Laterality: Left;   TRANSTHORACIC ECHOCARDIOGRAM  07-06-2012   mild LVH, ef 55-65%, grade 1 diastolic dysfunction/  mild AR/  trivial MR and TR   VAGINAL HYSTERECTOMY  1980'S   complete   Social History   Occupational History   Not on file  Tobacco Use   Smoking status: Current Every Day Smoker    Packs/day: 0.50    Years: 14.00    Pack years: 7.00    Types: Cigarettes   Smokeless tobacco: Never Used  Substance and Sexual Activity   Alcohol use: No   Drug use: No   Sexual activity: Not Currently

## 2019-11-11 ENCOUNTER — Other Ambulatory Visit: Payer: Self-pay

## 2019-11-17 NOTE — Patient Instructions (Signed)
DUE TO COVID-19 ONLY ONE VISITOR IS ALLOWED TO COME WITH YOU AND STAY IN THE WAITING ROOM ONLY DURING PRE OP AND PROCEDURE DAY OF SURGERY. THE 1 VISITOR MAY VISIT WITH YOU AFTER SURGERY IN YOUR PRIVATE ROOM DURING VISITING HOURS ONLY!  YOU NEED TO HAVE A COVID 19 TEST ON__3/9_____ @__2 :30_____, THIS TEST MUST BE DONE BEFORE SURGERY, COME  [place] [place] , [address].  ([place]) ONCE YOUR COVID TEST IS COMPLETED, PLEASE BEGIN THE QUARANTINE INSTRUCTIONS AS OUTLINED IN YOUR HANDOUT.                Kaitlyn [name]    Your procedure is scheduled on: 11/25/19   Report to [facility] Main  Entrance   Report to admitting at  8:30AM     Call this number if you have problems the morning of surgery [phone]    Remember: Do not eat food or drink liquids :After Midnight  . BRUSH YOUR TEETH MORNING OF SURGERY AND RINSE YOUR MOUTH OUT, NO CHEWING GUM CANDY OR MINTS.   Do not eat food After Midnight.   YOU MAY HAVE CLEAR LIQUIDS FROM MIDNIGHT UNTIL 8:00  AM.   At 8:00 AM Please finish the prescribed Pre-Surgery  drink.   Nothing by mouth after you finish the drink !    Take these medicines the morning of surgery with A SIP OF WATER: Levothyroxine, Pepcid                                You may not have any metal on your body including hair pins and              piercings  Do not wear jewelry, make-up, lotions, powders or perfumes, deodorant             Do not wear nail polish on your fingernails.  Do not shave  48 hours prior to surgery.             Do not bring valuables to the hospital. [place].  Contacts, dentures or bridgework may not be worn into surgery.      Patients discharged the day of surgery will not be allowed to drive home.  IF YOU ARE HAVING SURGERY AND GOING HOME THE SAME DAY, YOU MUST HAVE AN ADULT TO DRIVE YOU HOME AND BE WITH YOU FOR 24 HOURS.  YOU MAY GO HOME BY TAXI OR UBER  OR ORTHERWISE, BUT AN ADULT MUST ACCOMPANY YOU HOME AND STAY WITH YOU FOR 24 HOURS.  Name and phone number of your driver:  Special Instructions: N/A              Please read over the following fact sheets you were given: _____________________________________________________________________             [facility] - Preparing for Surgery  Before surgery, you can play an important role.   Because skin is not sterile, your skin needs to be as free of germs as possible.   You can reduce the number of germs on your skin by washing with CHG (chlorahexidine gluconate) soap before surgery.   CHG is an antiseptic cleaner which kills germs and bonds with the skin to continue killing germs even after washing. Please DO NOT use if you have an  allergy to CHG or antibacterial soaps.   If your skin becomes reddened/irritated stop using the CHG and inform your nurse when you arrive at Short Stay. Do not shave (including legs and underarms) for at least 48 hours prior to the first CHG shower.   Please follow these instructions carefully:  1.  Shower with CHG Soap the night before surgery and the  morning of Surgery.  2.  If you choose to wash your hair, wash your hair first as usual with your  normal  shampoo.  3.  After you shampoo, rinse your hair and body thoroughly to remove the  shampoo.                                        4.  Use CHG as you would any other liquid soap.  You can apply chg directly  to the skin and wash                       Gently with a scrungie or clean washcloth.  5.  Apply the CHG Soap to your body ONLY FROM THE NECK DOWN.   Do not use on face/ open                           Wound or open sores. Avoid contact with eyes, ears mouth and genitals (private parts).                       Wash face,  Genitals (private parts) with your normal soap.             6.  Wash thoroughly, paying special attention to the area where your surgery  will be performed.  7.  Thoroughly rinse your  body with warm water from the neck down.  8.  DO NOT shower/wash with your normal soap after using and rinsing off  the CHG Soap.             9.  Pat yourself dry with a clean towel.            10.  Wear clean pajamas.            11.  Place clean sheets on your bed the night of your first shower and do not  sleep with pets. Day of Surgery : Do not apply any lotions/deodorants the morning of surgery.  Please wear clean clothes to the hospital/surgery center.  FAILURE TO FOLLOW THESE INSTRUCTIONS MAY RESULT IN THE CANCELLATION OF YOUR SURGERY PATIENT SIGNATURE_________________________________  NURSE SIGNATURE__________________________________  ________________________________________________________________________   Kaitlyn [name]  An incentive spirometer is a tool that can help keep your lungs clear and active. This tool measures how well you are filling your lungs with each breath. Taking long deep breaths may help reverse or decrease the chance of developing breathing (pulmonary) problems (especially infection) following:  A long period of time when you are unable to move or be active. BEFORE THE PROCEDURE   If the spirometer includes an indicator to show your best effort, your nurse or respiratory therapist will set it to a desired goal.  If possible, sit up straight or lean slightly forward. Try not to slouch.  Hold the incentive spirometer in an upright position. INSTRUCTIONS FOR USE  1. Sit on the edge of your bed if  possible, or sit up as far as you can in bed or on a chair. 2. Hold the incentive spirometer in an upright position. 3. Breathe out normally. 4. Place the mouthpiece in your mouth and seal your lips tightly around it. 5. Breathe in slowly and as deeply as possible, raising the piston or the ball toward the top of the column. 6. Hold your breath for 3-5 seconds or for as long as possible. Allow the piston or ball to fall to the bottom of the  column. 7. Remove the mouthpiece from your mouth and breathe out normally. 8. Rest for a few seconds and repeat Steps 1 through 7 at least 10 times every 1-2 hours when you are awake. Take your time and take a few normal breaths between deep breaths. 9. The spirometer may include an indicator to show your best effort. Use the indicator as a goal to work toward during each repetition. 10. After each set of 10 deep breaths, practice coughing to be sure your lungs are clear. If you have an incision (the cut made at the time of surgery), support your incision when coughing by placing a pillow or rolled up towels firmly against it. Once you are able to get out of bed, walk around indoors and cough well. You may stop using the incentive spirometer when instructed by your caregiver.  RISKS AND COMPLICATIONS  Take your time so you do not get dizzy or light-headed.  If you are in pain, you may need to take or ask for pain medication before doing incentive spirometry. It is harder to take a deep breath if you are having pain. AFTER USE  Rest and breathe slowly and easily.  It can be helpful to keep track of a log of your progress. Your caregiver can provide you with a simple table to help with this. If you are using the spirometer at home, follow these instructions: [place] IF:   You are having difficultly using the spirometer.  You have trouble using the spirometer as often as instructed.  Your pain medication is not giving enough relief while using the spirometer.  You develop fever of 100.5 F (38.1 C) or higher. SEEK IMMEDIATE MEDICAL CARE IF:   You cough up bloody sputum that had not been present before.  You develop fever of 102 F (38.9 C) or greater.  You develop worsening pain at or near the incision site. MAKE SURE YOU:   Understand these instructions.  Will watch your condition.  Will get help right away if you are not doing well or get worse. Document Released:  01/12/2007 Document Revised: 11/24/2011 Document Reviewed: 03/15/2007 [facility] Patient Information 2014 [place], [place].   ________________________________________________________________________

## 2019-11-18 ENCOUNTER — Other Ambulatory Visit: Payer: Self-pay

## 2019-11-18 ENCOUNTER — Encounter (HOSPITAL_COMMUNITY)
Admission: RE | Admit: 2019-11-18 | Discharge: 2019-11-18 | Disposition: A | Discharge status: Home / Self Care | Payer: Medicare HMO | Source: Ambulatory Visit | Attending: Orthopaedic Surgery | Admitting: Orthopaedic Surgery

## 2019-11-18 ENCOUNTER — Other Ambulatory Visit: Payer: Self-pay | Admitting: Physician Assistant

## 2019-11-18 ENCOUNTER — Encounter (HOSPITAL_COMMUNITY): Payer: Self-pay

## 2019-11-18 DIAGNOSIS — Z01812 Encounter for preprocedural laboratory examination: Secondary | ICD-10-CM | POA: Diagnosis present

## 2019-11-18 HISTORY — DX: Anxiety disorder, unspecified: F41.9

## 2019-11-18 LAB — BASIC METABOLIC PANEL
Anion gap: 10 (ref 5–15)
BUN: 19 mg/dL (ref 8–23)
CO2: 24 mmol/L (ref 22–32)
Calcium: 9 mg/dL (ref 8.9–10.3)
Chloride: 109 mmol/L (ref 98–111)
Creatinine, Ser: 1.3 mg/dL — ABNORMAL HIGH (ref 0.44–1.00)
GFR calc Af Amer: 51 mL/min — ABNORMAL LOW (ref >60)
GFR calc non Af Amer: 44 mL/min — ABNORMAL LOW (ref >60)
Glucose, Bld: 80 mg/dL (ref 70–99)
Potassium: 4.5 mmol/L (ref 3.5–5.1)
Sodium: 143 mmol/L (ref 135–145)

## 2019-11-18 LAB — SURGICAL PCR SCREEN
MRSA, PCR: NEGATIVE
Staphylococcus aureus: POSITIVE — AB

## 2019-11-18 LAB — CBC
HCT: 37.5 % (ref 36.0–46.0)
Hemoglobin: 11.1 g/dL — ABNORMAL LOW (ref 12.0–15.0)
MCH: 25.9 pg — ABNORMAL LOW (ref 26.0–34.0)
MCHC: 29.6 g/dL — ABNORMAL LOW (ref 30.0–36.0)
MCV: 87.6 fL (ref 80.0–100.0)
Platelets: 330 10*3/uL (ref 150–400)
RBC: 4.28 MIL/uL (ref 3.87–5.11)
RDW: 17.4 % — ABNORMAL HIGH (ref 11.5–15.5)
WBC: 9.5 10*3/uL (ref 4.0–10.5)
nRBC: 0 % (ref 0.0–0.2)

## 2019-11-18 NOTE — Progress Notes (Signed)
PCP - Dr. Arita Miss Cardiologist - none  Chest x-ray - no EKG - no Stress Test - no ECHO - 2013 Cardiac Cath - no  Sleep Study - no CPAP -   Fasting Blood Sugar - NA Checks Blood Sugar _____ times a day  Blood Thinner Instructions:NA Aspirin Instructions: Last Dose:  Anesthesia review:   Patient denies shortness of breath, fever, cough and chest pain at PAT appointment  Yes Patient verbalized understanding of instructions that were given to them at the PAT appointment. Patient was also instructed that they will need to review over the PAT instructions again at home before surgery. yes

## 2019-11-18 NOTE — Patient Instructions (Signed)
DUE TO COVID-19 ONLY ONE VISITOR IS ALLOWED TO COME WITH YOU AND STAY IN THE WAITING ROOM ONLY DURING PRE OP AND PROCEDURE DAY OF SURGERY. THE 1 VISITOR MAY VISIT WITH YOU AFTER SURGERY IN YOUR PRIVATE ROOM DURING VISITING HOURS ONLY!  YOU NEED TO HAVE A COVID 19 TEST ON_3/9______ @__2 :30_____, THIS TEST MUST BE DONE BEFORE SURGERY, COME  [address], [place] , .  [facility]) ONCE YOUR COVID TEST IS COMPLETED, PLEASE BEGIN THE QUARANTINE INSTRUCTIONS AS OUTLINED IN YOUR HANDOUT.                Kaitlyn [name]    Your procedure is scheduled on: 11/25/19   Report to [facility] Main  Entrance   Report to admitting at 8:30 AM     Call this number if you have problems the morning of surgery [phone]    Remember: Do not eat food after Midnight               You can have clear liquids until 5:00 am. Then nothing by mouth.   CLEAR LIQUID DIET   Foods Allowed                                                                     Foods Excluded  Coffee and tea, regular and decaf                             liquids that you cannot  Plain Jell-O any favor except red or purple                                           see through such as: Fruit ices (not with fruit pulp)                                     milk, soups, orange juice  Iced Popsicles                                    All solid food Carbonated beverages, regular and diet                                    Cranberry, grape and apple juices Sports drinks like Gatorade Lightly seasoned clear broth or consume(fat free) Sugar, honey syrup      . BRUSH YOUR TEETH MORNING OF SURGERY AND RINSE YOUR MOUTH OUT, NO CHEWING GUM CANDY OR MINTS.     Take these medicines the morning of surgery with A SIP OF WATER: Levothyroxine, Pepsid                                 You may not have any metal on your body including hair pins and  piercings  Do not wear jewelry, make-up, lotions,  powders or perfumes, deodorant             Do not wear nail polish on your fingernails.  Do not shave  48 hours prior to surgery.                 Do not bring valuables to the hospital. [place].  Contacts, dentures or bridgework may not be worn into surgery.                   Please read over the following fact sheets you were given: _____________________________________________________________________             [facility] - Preparing for Surgery  Before surgery, you can play an important role.   Because skin is not sterile, your skin needs to be as free of germs as possible.   You can reduce the number of germs on your skin by washing with CHG (chlorahexidine gluconate) soap before surgery.   CHG is an antiseptic cleaner which kills germs and bonds with the skin to continue killing germs even after washing. Please DO NOT use if you have an allergy to CHG or antibacterial soaps.   If your skin becomes reddened/irritated stop using the CHG and inform your nurse when you arrive at Short Stay. Do not shave (including legs and underarms) for at least 48 hours prior to the first CHG shower.    Please follow these instructions carefully:  1.  Shower with CHG Soap the night before surgery and the  morning of Surgery.  2.  If you choose to wash your hair, wash your hair first as usual with your  normal  shampoo.  3.  After you shampoo, rinse your hair and body thoroughly to remove the  shampoo.                                        4.  Use CHG as you would any other liquid soap.  You can apply chg directly  to the skin and wash                       Gently with a scrungie or clean washcloth.  5.  Apply the CHG Soap to your body ONLY FROM THE NECK DOWN.   Do not use on face/ open                           Wound or open sores. Avoid contact with eyes, ears mouth and genitals (private parts).                       Wash face,  Genitals  (private parts) with your normal soap.             6.  Wash thoroughly, paying special attention to the area where your surgery  will be performed.  7.  Thoroughly rinse your body with warm water from the neck down.  8.  DO NOT shower/wash with your normal soap after using and rinsing off  the CHG Soap.             9.  Pat yourself dry with a  clean towel.            10.  Wear clean pajamas.            11.  Place clean sheets on your bed the night of your first shower and do not  sleep with pets. Day of Surgery : Do not apply any lotions/deodorants the morning of surgery.  Please wear clean clothes to the hospital/surgery center.  FAILURE TO FOLLOW THESE INSTRUCTIONS MAY RESULT IN THE CANCELLATION OF YOUR SURGERY PATIENT SIGNATURE_________________________________  NURSE SIGNATURE__________________________________  ________________________________________________________________________   Kaitlyn [name]  An incentive spirometer is a tool that can help keep your lungs clear and active. This tool measures how well you are filling your lungs with each breath. Taking long deep breaths may help reverse or decrease the chance of developing breathing (pulmonary) problems (especially infection) following:  A long period of time when you are unable to move or be active. BEFORE THE PROCEDURE   If the spirometer includes an indicator to show your best effort, your nurse or respiratory therapist will set it to a desired goal.  If possible, sit up straight or lean slightly forward. Try not to slouch.  Hold the incentive spirometer in an upright position. INSTRUCTIONS FOR USE  1. Sit on the edge of your bed if possible, or sit up as far as you can in bed or on a chair. 2. Hold the incentive spirometer in an upright position. 3. Breathe out normally. 4. Place the mouthpiece in your mouth and seal your lips tightly around it. 5. Breathe in slowly and as deeply as possible, raising the piston  or the ball toward the top of the column. 6. Hold your breath for 3-5 seconds or for as long as possible. Allow the piston or ball to fall to the bottom of the column. 7. Remove the mouthpiece from your mouth and breathe out normally. 8. Rest for a few seconds and repeat Steps 1 through 7 at least 10 times every 1-2 hours when you are awake. Take your time and take a few normal breaths between deep breaths. 9. The spirometer may include an indicator to show your best effort. Use the indicator as a goal to work toward during each repetition. 10. After each set of 10 deep breaths, practice coughing to be sure your lungs are clear. If you have an incision (the cut made at the time of surgery), support your incision when coughing by placing a pillow or rolled up towels firmly against it. Once you are able to get out of bed, walk around indoors and cough well. You may stop using the incentive spirometer when instructed by your caregiver.  RISKS AND COMPLICATIONS  Take your time so you do not get dizzy or light-headed.  If you are in pain, you may need to take or ask for pain medication before doing incentive spirometry. It is harder to take a deep breath if you are having pain. AFTER USE  Rest and breathe slowly and easily.  It can be helpful to keep track of a log of your progress. Your caregiver can provide you with a simple table to help with this. If you are using the spirometer at home, follow these instructions: SEEK MEDICAL CARE IF:   You are having difficultly using the spirometer.  You have trouble using the spirometer as often as instructed.  Your pain medication is not giving enough relief while using the spirometer.  You develop fever of 100.5 F (38.1 C) or  higher. SEEK IMMEDIATE MEDICAL CARE IF:   You cough up bloody sputum that had not been present before.  You develop fever of 102 F (38.9 C) or greater.  You develop worsening pain at or near the incision site. MAKE  SURE YOU:   Understand these instructions.  Will watch your condition.  Will get help right away if you are not doing well or get worse. Document Released: 01/12/2007 Document Revised: 11/24/2011 Document Reviewed: 03/15/2007 [facility] Patient Information 2014 [place], [place].   ________________________________________________________________________

## 2019-11-22 ENCOUNTER — Other Ambulatory Visit (HOSPITAL_COMMUNITY)
Admission: RE | Admit: 2019-11-22 | Discharge: 2019-11-22 | Disposition: A | Discharge status: Home / Self Care | Payer: Medicare HMO | Source: Ambulatory Visit | Attending: Orthopaedic Surgery | Admitting: Orthopaedic Surgery

## 2019-11-22 DIAGNOSIS — Z20822 Contact with and (suspected) exposure to covid-19: Secondary | ICD-10-CM | POA: Insufficient documentation

## 2019-11-22 DIAGNOSIS — Z01812 Encounter for preprocedural laboratory examination: Secondary | ICD-10-CM | POA: Diagnosis present

## 2019-11-23 LAB — SARS CORONAVIRUS 2 (TAT 6-24 HRS): SARS Coronavirus 2: NEGATIVE

## 2019-11-24 NOTE — Anesthesia Preprocedure Evaluation (Addendum)
Anesthesia Evaluation  Patient identified by MRN, date of birth, ID band  Reviewed: Allergy & Precautions, NPO status , Patient's Chart, lab work & pertinent test results  History of Anesthesia Complications (+) PONV and history of anesthetic complications  Airway Mallampati: I  TM Distance: >3 FB Neck ROM: Full    Dental no notable dental hx. (+) Upper Dentures, Dental Advisory Given   Pulmonary Current Smoker and Patient abstained from smoking.,    Pulmonary exam normal breath sounds clear to auscultation       Cardiovascular negative cardio ROS Normal cardiovascular exam Rhythm:Regular Rate:Normal     Neuro/Psych  Headaches,    GI/Hepatic Neg liver ROS, GERD  ,  Endo/Other  Hypothyroidism   Renal/GU K 4.5 Cr 1.30     Musculoskeletal  (+) Arthritis ,   Abdominal (+) + obese,   Peds  Hematology hgb 11.1 plt 330   Anesthesia Other Findings   Reproductive/Obstetrics                            Anesthesia Physical Anesthesia Plan  ASA: II  Anesthesia Plan: Spinal   Post-op Pain Management:  Regional for Post-op pain   Induction:   PONV Risk Score and Plan: Treatment may vary due to age or medical condition  Airway Management Planned: Natural Airway and Simple Face Mask  Additional Equipment: None  Intra-op Plan:   Post-operative Plan:   Informed Consent: I have reviewed the patients History and Physical, chart, labs and discussed the procedure including the risks, benefits and alternatives for the proposed anesthesia with the patient or authorized representative who has indicated his/her understanding and acceptance.     Dental advisory given  Plan Discussed with:   Anesthesia Plan Comments: (Spinal w L Adductor canal block)       Anesthesia Quick Evaluation

## 2019-11-25 ENCOUNTER — Encounter (HOSPITAL_COMMUNITY): Payer: Self-pay | Admitting: Orthopaedic Surgery

## 2019-11-25 ENCOUNTER — Observation Stay (HOSPITAL_COMMUNITY)
Admission: RE | Admit: 2019-11-25 | Discharge: 2019-11-26 | Disposition: A | Discharge status: Home / Self Care | Payer: Medicare HMO | Attending: Orthopaedic Surgery | Admitting: Orthopaedic Surgery

## 2019-11-25 ENCOUNTER — Other Ambulatory Visit: Payer: Self-pay

## 2019-11-25 ENCOUNTER — Ambulatory Visit (HOSPITAL_COMMUNITY): Payer: Medicare HMO | Admitting: Anesthesiology

## 2019-11-25 ENCOUNTER — Encounter (HOSPITAL_COMMUNITY)
Admission: RE | Disposition: A | Discharge status: Home / Self Care | Payer: Self-pay | Source: Home / Self Care | Attending: Orthopaedic Surgery

## 2019-11-25 ENCOUNTER — Observation Stay (HOSPITAL_COMMUNITY): Payer: Medicare HMO

## 2019-11-25 DIAGNOSIS — E039 Hypothyroidism, unspecified: Secondary | ICD-10-CM | POA: Insufficient documentation

## 2019-11-25 DIAGNOSIS — R262 Difficulty in walking, not elsewhere classified: Secondary | ICD-10-CM | POA: Insufficient documentation

## 2019-11-25 DIAGNOSIS — G4700 Insomnia, unspecified: Secondary | ICD-10-CM | POA: Insufficient documentation

## 2019-11-25 DIAGNOSIS — Z96652 Presence of left artificial knee joint: Secondary | ICD-10-CM

## 2019-11-25 DIAGNOSIS — M6281 Muscle weakness (generalized): Secondary | ICD-10-CM | POA: Diagnosis not present

## 2019-11-25 DIAGNOSIS — Z90710 Acquired absence of both cervix and uterus: Secondary | ICD-10-CM | POA: Insufficient documentation

## 2019-11-25 DIAGNOSIS — F329 Major depressive disorder, single episode, unspecified: Secondary | ICD-10-CM | POA: Diagnosis not present

## 2019-11-25 DIAGNOSIS — M1712 Unilateral primary osteoarthritis, left knee: Principal | ICD-10-CM

## 2019-11-25 DIAGNOSIS — Z79899 Other long term (current) drug therapy: Secondary | ICD-10-CM | POA: Insufficient documentation

## 2019-11-25 DIAGNOSIS — F17210 Nicotine dependence, cigarettes, uncomplicated: Secondary | ICD-10-CM | POA: Insufficient documentation

## 2019-11-25 DIAGNOSIS — E785 Hyperlipidemia, unspecified: Secondary | ICD-10-CM | POA: Diagnosis not present

## 2019-11-25 DIAGNOSIS — Z79890 Hormone replacement therapy (postmenopausal): Secondary | ICD-10-CM | POA: Insufficient documentation

## 2019-11-25 DIAGNOSIS — Z882 Allergy status to sulfonamides status: Secondary | ICD-10-CM | POA: Diagnosis not present

## 2019-11-25 DIAGNOSIS — F419 Anxiety disorder, unspecified: Secondary | ICD-10-CM | POA: Insufficient documentation

## 2019-11-25 DIAGNOSIS — K219 Gastro-esophageal reflux disease without esophagitis: Secondary | ICD-10-CM | POA: Diagnosis not present

## 2019-11-25 DIAGNOSIS — Z886 Allergy status to analgesic agent status: Secondary | ICD-10-CM | POA: Insufficient documentation

## 2019-11-25 DIAGNOSIS — Z96643 Presence of artificial hip joint, bilateral: Secondary | ICD-10-CM | POA: Diagnosis not present

## 2019-11-25 DIAGNOSIS — Z96611 Presence of right artificial shoulder joint: Secondary | ICD-10-CM | POA: Diagnosis not present

## 2019-11-25 DIAGNOSIS — Z96612 Presence of left artificial shoulder joint: Secondary | ICD-10-CM | POA: Diagnosis not present

## 2019-11-25 HISTORY — PX: TOTAL KNEE ARTHROPLASTY: SHX125

## 2019-11-25 LAB — TYPE AND SCREEN
ABO/RH(D): O POS
Antibody Screen: NEGATIVE

## 2019-11-25 SURGERY — ARTHROPLASTY, KNEE, TOTAL
Anesthesia: Spinal | Site: Knee | Laterality: Left

## 2019-11-25 MED ORDER — POVIDONE-IODINE 10 % EX SWAB
2.0000 "application " | Freq: Once | CUTANEOUS | Status: AC
  Administered 2019-11-25: 2 via TOPICAL

## 2019-11-25 MED ORDER — ONDANSETRON HCL 4 MG/2ML IJ SOLN
INTRAMUSCULAR | Status: DC | PRN
  Administered 2019-11-25: 4 mg via INTRAVENOUS

## 2019-11-25 MED ORDER — ONDANSETRON HCL 4 MG PO TABS: 4.0000 mg | ORAL_TABLET | Freq: Four times a day (QID) | ORAL | Status: DC | PRN

## 2019-11-25 MED ORDER — METHOCARBAMOL 500 MG IVPB - SIMPLE MED
500.0000 mg | Freq: Four times a day (QID) | INTRAVENOUS | Status: DC | PRN
  Filled 2019-11-25: qty 50

## 2019-11-25 MED ORDER — ACETAMINOPHEN 325 MG PO TABS: 325.0000 mg | ORAL_TABLET | Freq: Four times a day (QID) | ORAL | Status: DC | PRN

## 2019-11-25 MED ORDER — PROPOFOL 500 MG/50ML IV EMUL
INTRAVENOUS | Status: DC | PRN
  Administered 2019-11-25: 75 ug/kg/min via INTRAVENOUS

## 2019-11-25 MED ORDER — PROPOFOL 10 MG/ML IV BOLUS
INTRAVENOUS | Status: DC | PRN
  Administered 2019-11-25: 20 mg via INTRAVENOUS

## 2019-11-25 MED ORDER — DIVALPROEX SODIUM 250 MG PO DR TAB
500.0000 mg | DELAYED_RELEASE_TABLET | Freq: Every day | ORAL | Status: DC
  Administered 2019-11-25: 21:00:00 500 mg via ORAL
  Filled 2019-11-25: qty 2

## 2019-11-25 MED ORDER — ONDANSETRON HCL 4 MG/2ML IJ SOLN: 4.0000 mg | Freq: Once | INTRAMUSCULAR | Status: DC | PRN

## 2019-11-25 MED ORDER — PANTOPRAZOLE SODIUM 40 MG PO TBEC
40.0000 mg | DELAYED_RELEASE_TABLET | Freq: Every day | ORAL | Status: DC
  Administered 2019-11-25 – 2019-11-26 (×2): 40 mg via ORAL
  Filled 2019-11-25 (×2): qty 1

## 2019-11-25 MED ORDER — METOCLOPRAMIDE HCL 5 MG/ML IJ SOLN: 5.0000 mg | Freq: Three times a day (TID) | INTRAMUSCULAR | Status: DC | PRN

## 2019-11-25 MED ORDER — LEVOTHYROXINE SODIUM 100 MCG PO TABS
100.0000 ug | ORAL_TABLET | Freq: Every day | ORAL | Status: DC
  Administered 2019-11-26: 06:00:00 100 ug via ORAL
  Filled 2019-11-25: qty 1

## 2019-11-25 MED ORDER — EPHEDRINE 5 MG/ML INJ
INTRAVENOUS | Status: AC
  Filled 2019-11-25: qty 10

## 2019-11-25 MED ORDER — ALUM & MAG HYDROXIDE-SIMETH 200-200-20 MG/5ML PO SUSP: 30.0000 mL | ORAL | Status: DC | PRN

## 2019-11-25 MED ORDER — ESCITALOPRAM OXALATE 20 MG PO TABS
20.0000 mg | ORAL_TABLET | Freq: Every day | ORAL | Status: DC
  Administered 2019-11-25: 21:00:00 20 mg via ORAL
  Filled 2019-11-25: qty 1

## 2019-11-25 MED ORDER — CEFAZOLIN SODIUM-DEXTROSE 1-4 GM/50ML-% IV SOLN
1.0000 g | Freq: Four times a day (QID) | INTRAVENOUS | Status: AC
  Administered 2019-11-25 – 2019-11-26 (×2): 1 g via INTRAVENOUS
  Filled 2019-11-25 (×2): qty 50

## 2019-11-25 MED ORDER — ONDANSETRON HCL 4 MG/2ML IJ SOLN
INTRAMUSCULAR | Status: AC
  Filled 2019-11-25: qty 2

## 2019-11-25 MED ORDER — DOCUSATE SODIUM 100 MG PO CAPS
100.0000 mg | ORAL_CAPSULE | Freq: Two times a day (BID) | ORAL | Status: DC
  Administered 2019-11-25 – 2019-11-26 (×2): 100 mg via ORAL
  Filled 2019-11-25 (×2): qty 1

## 2019-11-25 MED ORDER — TRANEXAMIC ACID-NACL 1000-0.7 MG/100ML-% IV SOLN
1000.0000 mg | INTRAVENOUS | Status: AC
  Administered 2019-11-25: 12:00:00 1000 mg via INTRAVENOUS
  Filled 2019-11-25: qty 100

## 2019-11-25 MED ORDER — HYDROMORPHONE HCL 1 MG/ML IJ SOLN: 0.2500 mg | INTRAMUSCULAR | Status: DC | PRN

## 2019-11-25 MED ORDER — POLYETHYLENE GLYCOL 3350 17 G PO PACK: 17.0000 g | PACK | Freq: Every day | ORAL | Status: DC | PRN

## 2019-11-25 MED ORDER — CHLORHEXIDINE GLUCONATE 4 % EX LIQD: 60.0000 mL | Freq: Once | CUTANEOUS | Status: DC

## 2019-11-25 MED ORDER — MEPERIDINE HCL 50 MG/ML IJ SOLN: 6.2500 mg | INTRAMUSCULAR | Status: DC | PRN

## 2019-11-25 MED ORDER — DEXAMETHASONE SODIUM PHOSPHATE 10 MG/ML IJ SOLN
INTRAMUSCULAR | Status: AC
  Filled 2019-11-25: qty 1

## 2019-11-25 MED ORDER — HYDROMORPHONE HCL 1 MG/ML IJ SOLN
0.5000 mg | INTRAMUSCULAR | Status: DC | PRN
  Administered 2019-11-25: 0.5 mg via INTRAVENOUS
  Filled 2019-11-25: qty 1

## 2019-11-25 MED ORDER — OXYCODONE HCL 5 MG PO TABS
10.0000 mg | ORAL_TABLET | ORAL | Status: DC | PRN
  Administered 2019-11-25 – 2019-11-26 (×4): 15 mg via ORAL
  Filled 2019-11-25 (×4): qty 3

## 2019-11-25 MED ORDER — SODIUM CHLORIDE 0.9 % IV SOLN: INTRAVENOUS | Status: DC

## 2019-11-25 MED ORDER — QUETIAPINE FUMARATE 50 MG PO TABS
200.0000 mg | ORAL_TABLET | Freq: Every day | ORAL | Status: DC
  Administered 2019-11-25: 21:00:00 200 mg via ORAL
  Filled 2019-11-25: qty 8

## 2019-11-25 MED ORDER — PHENOL 1.4 % MT LIQD: 1.0000 | OROMUCOSAL | Status: DC | PRN

## 2019-11-25 MED ORDER — GABAPENTIN 300 MG PO CAPS
300.0000 mg | ORAL_CAPSULE | Freq: Two times a day (BID) | ORAL | Status: DC
  Administered 2019-11-25 – 2019-11-26 (×2): 300 mg via ORAL
  Filled 2019-11-25 (×2): qty 1

## 2019-11-25 MED ORDER — LACTATED RINGERS IV SOLN: INTRAVENOUS | Status: DC

## 2019-11-25 MED ORDER — ACETAMINOPHEN 10 MG/ML IV SOLN: 1000.0000 mg | Freq: Once | INTRAVENOUS | Status: DC | PRN

## 2019-11-25 MED ORDER — OXYCODONE HCL 5 MG PO TABS: 5.0000 mg | ORAL_TABLET | Freq: Once | ORAL | Status: DC | PRN

## 2019-11-25 MED ORDER — MENTHOL 3 MG MT LOZG: 1.0000 | LOZENGE | OROMUCOSAL | Status: DC | PRN

## 2019-11-25 MED ORDER — OXYCODONE HCL 5 MG PO TABS
5.0000 mg | ORAL_TABLET | ORAL | Status: DC | PRN
  Administered 2019-11-25: 17:00:00 10 mg via ORAL
  Filled 2019-11-25: qty 2

## 2019-11-25 MED ORDER — LIDOCAINE 2% (20 MG/ML) 5 ML SYRINGE
INTRAMUSCULAR | Status: AC
  Filled 2019-11-25: qty 15

## 2019-11-25 MED ORDER — OXYCODONE HCL 5 MG/5ML PO SOLN: 5.0000 mg | Freq: Once | ORAL | Status: DC | PRN

## 2019-11-25 MED ORDER — SODIUM CHLORIDE 0.9 % IR SOLN
Status: DC | PRN
  Administered 2019-11-25: 1000 mL

## 2019-11-25 MED ORDER — ONDANSETRON HCL 4 MG/2ML IJ SOLN: 4.0000 mg | Freq: Four times a day (QID) | INTRAMUSCULAR | Status: DC | PRN

## 2019-11-25 MED ORDER — METHOCARBAMOL 500 MG PO TABS
500.0000 mg | ORAL_TABLET | Freq: Four times a day (QID) | ORAL | Status: DC | PRN
  Administered 2019-11-25 – 2019-11-26 (×3): 500 mg via ORAL
  Filled 2019-11-25 (×3): qty 1

## 2019-11-25 MED ORDER — FENTANYL CITRATE (PF) 100 MCG/2ML IJ SOLN
50.0000 ug | INTRAMUSCULAR | Status: DC
  Administered 2019-11-25: 10:00:00 50 ug via INTRAVENOUS
  Filled 2019-11-25: qty 2

## 2019-11-25 MED ORDER — METOCLOPRAMIDE HCL 5 MG PO TABS
5.0000 mg | ORAL_TABLET | Freq: Three times a day (TID) | ORAL | Status: DC | PRN
Start: 2019-11-25 — End: 2019-11-26

## 2019-11-25 MED ORDER — CEFAZOLIN SODIUM-DEXTROSE 2-4 GM/100ML-% IV SOLN
2.0000 g | INTRAVENOUS | Status: AC
  Administered 2019-11-25: 12:00:00 2 g via INTRAVENOUS
  Filled 2019-11-25: qty 100

## 2019-11-25 MED ORDER — LACTATED RINGERS IV SOLN: INTRAVENOUS | Status: DC | PRN

## 2019-11-25 MED ORDER — FAMOTIDINE 20 MG PO TABS
40.0000 mg | ORAL_TABLET | Freq: Every day | ORAL | Status: DC
  Administered 2019-11-25 – 2019-11-26 (×2): 40 mg via ORAL
  Filled 2019-11-25 (×2): qty 2

## 2019-11-25 MED ORDER — DIPHENHYDRAMINE HCL 12.5 MG/5ML PO ELIX
12.5000 mg | ORAL_SOLUTION | ORAL | Status: DC | PRN
Start: 2019-11-25 — End: 2019-11-26

## 2019-11-25 MED ORDER — BUPIVACAINE IN DEXTROSE 0.75-8.25 % IT SOLN
INTRATHECAL | Status: DC | PRN
  Administered 2019-11-25: 1.4 mL via INTRATHECAL

## 2019-11-25 MED ORDER — 0.9 % SODIUM CHLORIDE (POUR BTL) OPTIME
TOPICAL | Status: DC | PRN
  Administered 2019-11-25: 1000 mL

## 2019-11-25 MED ORDER — DEXAMETHASONE SODIUM PHOSPHATE 10 MG/ML IJ SOLN
INTRAMUSCULAR | Status: DC | PRN
  Administered 2019-11-25: 10 mg via INTRAVENOUS

## 2019-11-25 MED ORDER — STERILE WATER FOR IRRIGATION IR SOLN
Status: DC | PRN
  Administered 2019-11-25: 2000 mL

## 2019-11-25 MED ORDER — MIDAZOLAM HCL 2 MG/2ML IJ SOLN
1.0000 mg | INTRAMUSCULAR | Status: DC
  Administered 2019-11-25: 10:00:00 2 mg via INTRAVENOUS
  Filled 2019-11-25: qty 2

## 2019-11-25 MED ORDER — PROPOFOL 1000 MG/100ML IV EMUL
INTRAVENOUS | Status: AC
  Filled 2019-11-25: qty 100

## 2019-11-25 MED ORDER — ASPIRIN 81 MG PO CHEW
81.0000 mg | CHEWABLE_TABLET | Freq: Two times a day (BID) | ORAL | Status: DC
  Administered 2019-11-25 – 2019-11-26 (×2): 81 mg via ORAL
  Filled 2019-11-25 (×2): qty 1

## 2019-11-25 MED ORDER — PHENYLEPHRINE HCL-NACL 10-0.9 MG/250ML-% IV SOLN
INTRAVENOUS | Status: DC | PRN
  Administered 2019-11-25: 35 ug/min via INTRAVENOUS

## 2019-11-25 MED ORDER — LIDOCAINE HCL (CARDIAC) PF 100 MG/5ML IV SOSY
PREFILLED_SYRINGE | INTRAVENOUS | Status: DC | PRN
  Administered 2019-11-25: 60 mg via INTRAVENOUS

## 2019-11-25 MED ORDER — LORAZEPAM 0.5 MG PO TABS
0.5000 mg | ORAL_TABLET | Freq: Every day | ORAL | Status: DC | PRN
Start: 2019-11-25 — End: 2019-11-26

## 2019-11-25 MED ORDER — ROPIVACAINE HCL 7.5 MG/ML IJ SOLN
INTRAMUSCULAR | Status: DC | PRN
  Administered 2019-11-25: 20 mL via PERINEURAL

## 2019-11-25 MED ORDER — BUPIVACAINE-EPINEPHRINE (PF) 0.25% -1:200000 IJ SOLN
INTRAMUSCULAR | Status: AC
  Filled 2019-11-25: qty 30

## 2019-11-25 SURGICAL SUPPLY — 57 items
BAG ZIPLOCK 12X15 (MISCELLANEOUS) IMPLANT
BASEPLATE TIBIAL TRIATHALON 3 (Plate) ×3 IMPLANT
BENZOIN TINCTURE PRP APPL 2/3 (GAUZE/BANDAGES/DRESSINGS) IMPLANT
BLADE SAG 18X100X1.27 (BLADE) ×3 IMPLANT
BLADE SURG SZ10 CARB STEEL (BLADE) ×6 IMPLANT
BNDG ELASTIC 6X5.8 VLCR STR LF (GAUZE/BANDAGES/DRESSINGS) ×3 IMPLANT
BOWL SMART MIX CTS (DISPOSABLE) ×3 IMPLANT
CEMENT BONE SIMPLEX SPEEDSET (Cement) ×6 IMPLANT
CLOSURE WOUND 1/2 X4 (GAUZE/BANDAGES/DRESSINGS)
COVER SURGICAL LIGHT HANDLE (MISCELLANEOUS) ×3 IMPLANT
COVER WAND RF STERILE (DRAPES) ×3 IMPLANT
CUFF TOURN SGL QUICK 34 (TOURNIQUET CUFF) ×3
CUFF TRNQT CYL 34X4.125X (TOURNIQUET CUFF) ×1 IMPLANT
DECANTER SPIKE VIAL GLASS SM (MISCELLANEOUS) IMPLANT
DRAPE U-SHAPE 47X51 STRL (DRAPES) ×3 IMPLANT
DRSG ADAPTIC 3X8 NADH LF (GAUZE/BANDAGES/DRESSINGS) ×3 IMPLANT
DRSG PAD ABDOMINAL 8X10 ST (GAUZE/BANDAGES/DRESSINGS) ×6 IMPLANT
DURAPREP 26ML APPLICATOR (WOUND CARE) ×3 IMPLANT
ELECT REM PT RETURN 15FT ADLT (MISCELLANEOUS) ×3 IMPLANT
FEMORAL PEG DISTAL FIXATION (Orthopedic Implant) ×3 IMPLANT
FEMORAL TRIATH POST STAB  SZ3 (Orthopedic Implant) ×3 IMPLANT
FEMORAL TRIATH POST STAB SZ3 (Orthopedic Implant) ×1 IMPLANT
GAUZE SPONGE 4X4 12PLY STRL (GAUZE/BANDAGES/DRESSINGS) ×3 IMPLANT
GAUZE XEROFORM 1X8 LF (GAUZE/BANDAGES/DRESSINGS) IMPLANT
GLOVE BIO SURGEON STRL SZ7.5 (GLOVE) ×3 IMPLANT
GLOVE BIOGEL PI IND STRL 8 (GLOVE) ×2 IMPLANT
GLOVE BIOGEL PI INDICATOR 8 (GLOVE) ×4
GLOVE ECLIPSE 8.0 STRL XLNG CF (GLOVE) ×3 IMPLANT
GOWN STRL REUS W/TWL XL LVL3 (GOWN DISPOSABLE) ×6 IMPLANT
HANDPIECE INTERPULSE COAX TIP (DISPOSABLE) ×3
HOLDER FOLEY CATH W/STRAP (MISCELLANEOUS) ×3 IMPLANT
IMMOBILIZER KNEE 20 (SOFTGOODS) ×3
IMMOBILIZER KNEE 20 THIGH 36 (SOFTGOODS) ×1 IMPLANT
INSERT TIBIA BEAR SZ3 9 KNEE (Miscellaneous) ×3 IMPLANT
KIT TURNOVER KIT A (KITS) IMPLANT
NS IRRIG 1000ML POUR BTL (IV SOLUTION) ×3 IMPLANT
PACK TOTAL KNEE CUSTOM (KITS) ×3 IMPLANT
PADDING CAST COTTON 6X4 STRL (CAST SUPPLIES) ×3 IMPLANT
PATELLA TRIATHALON (Knees) ×3 IMPLANT
PENCIL SMOKE EVACUATOR (MISCELLANEOUS) IMPLANT
PIN FLUTED HEDLESS FIX 3.5X1/8 (PIN) ×3 IMPLANT
PROTECTOR NERVE ULNAR (MISCELLANEOUS) ×3 IMPLANT
SET HNDPC FAN SPRY TIP SCT (DISPOSABLE) ×1 IMPLANT
SET PAD KNEE POSITIONER (MISCELLANEOUS) ×3 IMPLANT
STAPLER VISISTAT 35W (STAPLE) IMPLANT
STRIP CLOSURE SKIN 1/2X4 (GAUZE/BANDAGES/DRESSINGS) IMPLANT
SUT MNCRL AB 4-0 PS2 18 (SUTURE) IMPLANT
SUT VIC AB 0 CT1 27 (SUTURE) ×3
SUT VIC AB 0 CT1 27XBRD ANTBC (SUTURE) ×1 IMPLANT
SUT VIC AB 1 CT1 36 (SUTURE) ×6 IMPLANT
SUT VIC AB 2-0 CT1 27 (SUTURE) ×6
SUT VIC AB 2-0 CT1 TAPERPNT 27 (SUTURE) ×2 IMPLANT
TRAY FOLEY MTR SLVR 14FR STAT (SET/KITS/TRAYS/PACK) ×3 IMPLANT
TRAY FOLEY MTR SLVR 16FR STAT (SET/KITS/TRAYS/PACK) IMPLANT
WATER STERILE IRR 1000ML POUR (IV SOLUTION) ×6 IMPLANT
WRAP KNEE MAXI GEL POST OP (GAUZE/BANDAGES/DRESSINGS) ×3 IMPLANT
YANKAUER SUCT BULB TIP 10FT TU (MISCELLANEOUS) ×3 IMPLANT

## 2019-11-25 NOTE — Anesthesia Postprocedure Evaluation (Signed)
Anesthesia Post Note  Patient: Kaitlyn [name]  Procedure(s) Performed: LEFT TOTAL KNEE ARTHROPLASTY (Left Knee)     Patient location during evaluation: Nursing Unit Anesthesia Type: Spinal Level of consciousness: oriented and awake and alert Pain management: pain level controlled Vital Signs Assessment: post-procedure vital signs reviewed and stable Respiratory status: spontaneous breathing and respiratory function stable Cardiovascular status: blood pressure returned to baseline and stable Postop Assessment: no headache, no backache, no apparent nausea or vomiting and patient able to bend at knees Anesthetic complications: no    Last Vitals:  Vitals:   11/25/19 1626 11/25/19 1720  BP: 121/73 121/82  Pulse: 82 97  Resp: 14 16  Temp: (!) 36.4 C 36.5 C  SpO2: 96% 92%    Last Pain:  Vitals:   11/25/19 1635  TempSrc:   PainSc: 6                  Trevor Iha

## 2019-11-25 NOTE — Anesthesia Procedure Notes (Signed)
Spinal  Patient location during procedure: OR Start time: 11/25/2019 11:32 AM End time: 11/25/2019 11:35 AM Staffing Performed: anesthesiologist  Anesthesiologist: Trevor Iha, MD Preanesthetic Checklist Completed: patient identified, IV checked, site marked, risks and benefits discussed, surgical consent, monitors and equipment checked, pre-op evaluation and timeout performed Spinal Block Patient position: sitting Prep: DuraPrep Patient monitoring: heart rate, cardiac monitor, continuous pulse ox and blood pressure Approach: midline Location: L3-4 Injection technique: single-shot Needle Needle type: Sprotte  Needle gauge: 24 G Needle length: 9 cm Needle insertion depth: 5 cm Assessment Sensory level: T4 Additional Notes 1 attempt pt tolerated procedure well

## 2019-11-25 NOTE — Transfer of Care (Addendum)
Immediate Anesthesia Transfer of Care Note  Patient: Kaitlyn [name]  Procedure(s) Performed: LEFT TOTAL KNEE ARTHROPLASTY (Left Knee)  Patient Location: PACU  Anesthesia Type:Spinal  Level of Consciousness: awake, alert , oriented and patient cooperative  Airway & Oxygen Therapy: Patient Spontanous Breathing and Patient connected to face mask oxygen  Post-op Assessment: Report given to RN and Post -op Vital signs reviewed and stable  Post vital signs: Reviewed and stable  Last Vitals:  Vitals Value Taken Time  BP 133/77 11/25/19 1440  Temp 36.7 C 11/25/19 1430  Pulse 71 11/25/19 1440  Resp 11 11/25/19 1440  SpO2 98 % 11/25/19 1440    Last Pain:  Vitals:   11/25/19 1440  TempSrc:   PainSc: 0-No pain      Patients Stated Pain Goal: 4 (11/25/19 0850)  Complications: No apparent anesthesia complications

## 2019-11-25 NOTE — Evaluation (Signed)
Physical Therapy Evaluation Patient Details Name: Kaitlyn [name] MRN: [id number] DOB: Feb 11, 1956 Today's Date: 11/25/2019   History of Present Illness  Patient is 64 y.o. female s/p Lt TKA on 11/25/19 with PMH significant for HLD, hypothyroidism, GERD, OA, depression, anxiety, bil THA, bil TSA.     Clinical Impression  Kaitlyn [name] is a 64 y.o. female POD 0 s/p Lt TKA. Patient reports modified independence with SPC for mobility at baseline. Patient is now limited by functional impairments (see PT problem list below) and requires min assist/guard for transfers and gait with RW. Patient was able to ambulate ~80 feet with RW and min guard assist. Patient instructed in exercise to facilitate ROM and circulation. Patient will benefit from continued skilled PT interventions to address impairments and progress towards PLOF. Acute PT will follow to progress mobility and stair training in preparation for safe discharge home.     Follow Up Recommendations Follow surgeon's recommendation for DC plan and follow-up therapies    Equipment Recommendations  None recommended by PT    Recommendations for Other Services       Precautions / Restrictions Precautions Precautions: Fall Restrictions Weight Bearing Restrictions: No      Mobility  Bed Mobility Overal bed mobility: Needs Assistance Bed Mobility: Supine to Sit         Transfers Overall transfer level: Needs assistance Equipment used: Rolling walker (2 wheeled) Transfers: Sit to/from Stand Sit to Stand: Min assist         General transfer comment: cues for and placement and technique with RW, light assist required to rise, pt steady in standing  Ambulation/Gait Ambulation/Gait assistance: Min guard;Min assist Gait Distance (Feet): 80 Feet Assistive device: Rolling walker (2 wheeled) Gait Pattern/deviations: Step-to pattern;Decreased weight shift to left Gait velocity: fair   General Gait Details: cues for sequencing step pattern  and start and pt maitained throughout. no overt LOB noted and pt maintained safe proximity to RW.  Stairs            Wheelchair Mobility    Modified Rankin (Stroke Patients Only)       Balance Overall balance assessment: Needs assistance Sitting-balance support: Feet supported Sitting balance-Leahy Scale: Good     Standing balance support: Bilateral upper extremity supported;During functional activity Standing balance-Leahy Scale: Poor           Pertinent Vitals/Pain Pain Assessment: No/denies pain    Home Living Family/patient expects to be discharged to:: Private residence Living Arrangements: Spouse/significant other Available Help at Discharge: Family Type of Home: House Home Access: Stairs to enter Entrance Stairs-Rails: Right Entrance Stairs-Number of Steps: 3 Home Layout: One level Home Equipment: Grab bars - tub/shower;Walker - 2 wheels;Cane - single point;Hand held shower head;Shower seat      Prior Function Level of Independence: Independent with assistive device(s)         Comments: pt has been using SPC to mobilize prior to surgery. pt's husband performing grocery shopping.     Hand Dominance   Dominant Hand: Left    Extremity/Trunk Assessment   Upper Extremity Assessment Upper Extremity Assessment: Overall WFL for tasks assessed    Lower Extremity Assessment Lower Extremity Assessment: LLE deficits/detail LLE Deficits / Details: good quad activation, no extensor lag with SLR, no buckling noted in standing LLE Sensation: (pt reports LE's still numb) LLE Coordination: WNL    Cervical / Trunk Assessment Cervical / Trunk Assessment: Normal  Communication   Communication: No difficulties  Cognition Arousal/Alertness: Awake/alert Behavior During  Therapy: WFL for tasks assessed/performed Overall Cognitive Status: Within Functional Limits for tasks assessed             General Comments      Exercises Total Joint  Exercises Ankle Circles/Pumps: AROM;Both;10 reps;Seated Quad Sets: AROM;Left;5 reps;Seated Heel Slides: AROM;Left;5 reps;Seated   Assessment/Plan    PT Assessment Patient needs continued PT services  PT Problem List Decreased strength;Decreased range of motion;Decreased activity tolerance;Decreased balance;Decreased mobility       PT Treatment Interventions DME instruction;Gait training;Stair training;Therapeutic activities;Functional mobility training;Therapeutic exercise;Balance training;Patient/family education    PT Goals (Current goals can be found in the Care Plan section)  Acute Rehab PT Goals Patient Stated Goal: to be able to do work around the house PT Goal Formulation: With patient Time For Goal Achievement: 12/02/19 Potential to Achieve Goals: Good    Frequency 7X/week    AM-PAC PT "6 Clicks" Mobility  Outcome Measure Help needed turning from your back to your side while in a flat bed without using bedrails?: None Help needed moving from lying on your back to sitting on the side of a flat bed without using bedrails?: A Little Help needed moving to and from a bed to a chair (including a wheelchair)?: A Little Help needed standing up from a chair using your arms (e.g., wheelchair or bedside chair)?: A Little Help needed to walk in hospital room?: A Little Help needed climbing 3-5 steps with a railing? : A Little 6 Click Score: 19    End of Session Equipment Utilized During Treatment: Gait belt Activity Tolerance: Patient tolerated treatment well Patient left: in chair;with call bell/phone within reach;with chair alarm set;with family/visitor present Nurse Communication: Mobility status PT Visit Diagnosis: Muscle weakness (generalized) (M62.81);Difficulty in walking, not elsewhere classified (R26.2)    Time: 1610-9604 PT Time Calculation (min) (ACUTE ONLY): 24 min   Charges:   PT Evaluation $PT Eval Low Complexity: 1 Low PT Treatments $Therapeutic Exercise:  8-22 mins       Verner Mould, DPT Physical Therapist with [facility] [phone]  11/25/2019 4:14 PM

## 2019-11-25 NOTE — Anesthesia Procedure Notes (Addendum)
Anesthesia Regional Block: Adductor canal block   Pre-Anesthetic Checklist: ,, timeout performed, Correct Patient, Correct Site, Correct Laterality, Correct Procedure, Correct Position, site marked, Risks and benefits discussed,  Surgical consent,  Pre-op evaluation,  At surgeon's request and post-op pain management  Laterality: Lower and Left  Prep: chloraprep       Needles:  Injection technique: Single-shot  Needle Type: Echogenic Needle     Needle Length: 9cm  Needle Gauge: 22     Additional Needles:   Procedures:,,,, ultrasound used (permanent image in chart),,,,  Narrative:  Start time: 11/25/2019 10:24 AM End time: 11/25/2019 10:32 AM Injection made incrementally with aspirations every 5 mL.  Performed by: Personally  Anesthesiologist: Trevor Iha, MD  Additional Notes: Block assessed prior to surgery. Pt tolerated procedure well.

## 2019-11-25 NOTE — Brief Op Note (Signed)
11/25/2019  1:00 PM  PATIENT:  Kaitlyn [name]  64 y.o. female  PRE-OPERATIVE DIAGNOSIS:  endstage arthritis left knee  POST-OPERATIVE DIAGNOSIS:  endstage arthritis left knee  PROCEDURE:  Procedure(s): LEFT TOTAL KNEE ARTHROPLASTY (Left)  SURGEON:  Surgeon(s) and Role:    Kathryne Hitch, MD - Primary  PHYSICIAN ASSISTANT:  Rexene Edison, PA-C  ANESTHESIA:   regional and spinal  COUNTS:  YES  TOURNIQUET:   Total Tourniquet Time Documented: Thigh (Left) - 53 minutes Total: Thigh (Left) - 53 minutes   DICTATION: .Other Dictation: Dictation Number [id number]  PLAN OF CARE: Admit to inpatient   PATIENT DISPOSITION:  PACU - hemodynamically stable.   Delay start of Pharmacological VTE agent (>24hrs) due to surgical blood loss or risk of bleeding: no

## 2019-11-25 NOTE — Op Note (Signed)
NAME: Kaitlyn [name], [facility] MEDICAL RECORD ZO:[id number] ACCOUNT [id number] DATE OF BIRTH:02/25/1956 FACILITY: [facility] LOCATION: [facility] PH[facility]ICIAN:Canio Winokur [place], MD  OPERATIVE REPORT  DATE OF PROCEDURE:  11/25/2019  PREOPERATIVE DIAGNOSIS:  Primary osteoarthritis and degenerative joint disease, left knee.  POSTOPERATIVE DIAGNOSIS:  Primary osteoarthritis and degenerative joint disease, left knee.  PROCEDURE:  Left total knee arthroplasty.  IMPLANTS:  Stryker Triathlon cemented knee system with size 3 femur, size 3 tibial tray, size 29 patellar button, size 3 mm fixed-bearing polyethylene insert.  SURGEON:  Lind Guest. Ninfa Linden, MD  ASSISTANT:  Erskine Emery, PA-C  ANESTHESIA:   1.  Left lower extremity adductor canal block. 2.  Spinal.  TIME:  Less than 1 hour.  ANTIBIOTICS:  Two grams IV Ancef.  ESTIMATED BLOOD LOSS:  Less than 100 mL.  COMPLICATIONS:  None.  INDICATIONS:  The patient is a 64 year old patient well known to me.  She does have more of a rheumatologic disease with multiple joints that have had arthritis.  She has had bilateral shoulder replacements and bilateral hip replacements.  Both of her  knees show severe end-stage arthritis.  Today, she is presenting for her left knee to be replaced.  X-rays show significant loss of joint space with varus malalignment.  She has had recurrent effusions from her left knee.  At this point, she has tried  and failed all forms of conservative treatment and does wish to proceed with surgery.  Having had total joint surgery before she is fully aware of the risk of acute blood loss anemia, nerve or vessel injury, fracture, infection, implant failure and DVT.   She understands our goals are to decrease pain, improve mobility, and overall improve quality of life.  DESCRIPTION OF PROCEDURE:  After informed consent was obtained and appropriate left knee was marked, anesthesia obtained an adductor canal block in the holding room.   She was then brought to the operating room and sat up on the operating table where  spinal anesthesia was obtained.  She was laid in supine position on the operating table.  Foley catheter was placed and nonsterile tourniquet was placed around her upper left thigh.  Her left thigh, knee, leg, ankle and foot were prepped and draped with  DuraPrep and sterile drapes.  A time-out was called.  She was identified as correct patient, correct left knee.  I then used an Esmarch to wrap that leg and tourniquet was inflated to 300 mm of pressure.  I then made an incision over the knee, carried  this proximally and distally.  We dissected down the knee joint and carried out a medial parapatellar arthrotomy finding very large joint effusion and significant synovitis and arthritis throughout the knee.  We removed synovium from the knee and then  with the knee in a flexed position, we removed remnants of the ACL, PCL, medial and lateral meniscus as well as osteophytes from around the knee.  Using the extramedullary cutting guide for the tibia we chose to make our tibia cut, taking 9 mm off the  high side, correcting varus and valgus and neutral slope.  We made this cut without difficulty.  We then used an intramedullary guide for the femur for setting our distal femoral cut for a left knee at 5 degrees externally rotated and an 8 mm distal  femoral cut.  We again made this cut without difficulty.  We brought the knee back down to full extension with a 9 mm extension block and achieved full extension.  We  then went back to the femur and put our femoral sizing guide based off the epicondylar  axis for a left knee at 3 degrees.  We set this based off the epicondylar axis and Whiteside line.  We based off this, we chose a size 3 femur.  We put a 4-in-1 cutting block for size 3 femur.  We made our anterior and posterior cuts, followed by our  chamfer cuts.  We then went back to the tibia and chose a size 3 for tibial coverage.   We set the rotation off of the tibial tubercle and the femur and made our keel cut for a size 3 tibia tray.  With the 3 tibial trial tray in place followed by the 3  left femur trial, we tried a 9 mm fixed bearing polyethylene insert and put the knee through flexion and extension.  We were pleased with the range of motion of the knee and stability.  We then made our patellar cut and drilled 3 holes for a central  symmetric patellar button.  With all trial instrumentation of the knee again, I was pleased with range of motion and stability.  We then removed all trial instrumentation from the knee.  We irrigated the knee with normal saline solution using pulsatile  lavage.  We dried the knee real well and mixed our cement.  We then cemented our Stryker Triathlon tibial tray size 3 followed by our size 3 left femur.  We placed our 9 mm fixed bearing polyethylene insert and cemented our size 29 symmetric patellar  button.  With the knee in full extension, we removed all cement debris from the knee and let the cement cure.  Once it had hardened, we put the knee through range of motion.  I was pleased with stability.  We then let the tourniquet down.  Hemostasis  obtained with electrocautery.  We closed the arthrotomy with interrupted #1 Vicryl suture followed by 0 Vicryl to close deep tissue and 2-0 Vicryl was used to close the subcutaneous tissue.  The skin was reapproximated with staples.  Xeroform well-padded  sterile dressing was applied.  She was taken to recovery room in stable condition.  All final counts were correct.  There were no complications noted.  Of note, Rexene Edison, PA-C did assist during the entire case and his assistance was crucial for  facilitating all aspects of this case.  CN/NUANCE  D:11/25/2019 T:11/25/2019 JOB:[id number]

## 2019-11-25 NOTE — H&P (Signed)
TOTAL KNEE ADMISSION H&P  Patient is being admitted for left total knee arthroplasty.  Subjective:  Chief Complaint:left knee pain.  HPI: Kaitlyn [name], 64 y.o. female, has a history of pain and functional disability in the left knee due to arthritis and has failed non-surgical conservative treatments for greater than 12 weeks to includeNSAID's and/or analgesics, corticosteriod injections, viscosupplementation injections, flexibility and strengthening excercises, use of assistive devices, weight reduction as appropriate and activity modification.  Onset of symptoms was gradual, starting 2 years ago with gradually worsening course since that time. The patient noted no past surgery on the left knee(s).  Patient currently rates pain in the left knee(s) at 10 out of 10 with activity. Patient has night pain, worsening of pain with activity and weight bearing, pain that interferes with activities of daily living, pain with passive range of motion, crepitus and joint swelling.  Patient has evidence of subchondral sclerosis, periarticular osteophytes and joint space narrowing by imaging studies. There is no active infection.  Patient Active Problem List   Diagnosis Date Noted  . Unilateral primary osteoarthritis, left knee 11/25/2019  . S/P shoulder replacement, left 08/05/2018  . Status post total shoulder arthroplasty, right 04/15/2018  . Cellulitis of right hip 01/01/2018  . Avascular necrosis of bones of both hips ([place]) 11/06/2017  . Status post bilateral total hip replacement 11/06/2017  . Bilateral hip pain 08/31/2017  . Avascular necrosis of hip, left ([place]) 08/31/2017  . Avascular necrosis of hip, right ([place]) 08/31/2017   Past Medical History:  Diagnosis Date  . Anxiety   . Arthritis    oa  . Depression   . Frequency of urination   . GERD (gastroesophageal reflux disease)    CONTROLLED W/ PRILOSEC  . Headache    hx of migraines  . Hyperlipemia   . Hypothyroidism   . Incontinence of  urine   . Insomnia   . Interstitial cystitis   . Pelvic pain syndrome I.C.  . PONV (postoperative nausea and vomiting)   . Urgency of urination   . Wears dentures    UPPER  . Wears glasses     Past Surgical History:  Procedure Laterality Date  . BILATERAL ANTERIOR TOTAL HIP ARTHROPLASTY Bilateral 11/06/2017   Procedure: BILATERAL TOTAL HIP ARTHROPLASTY ANTERIOR APPROACH;  Surgeon: Mcarthur Rossetti, MD;  Location: [facility];  Service: Orthopedics;  Laterality: Bilateral;  . CYSTO WITH HYDRODISTENSION  09/18/2011   Procedure: CYSTOSCOPY/HYDRODISTENSION;  Surgeon: Malka So;  Location: [facility];  Service: Urology;  Laterality: N/A;  Instillation of Marcaine & Pyridium  . CYSTO WITH HYDRODISTENSION N/A 05/31/2013   Procedure: CYSTOSCOPY/HYDRODISTENSION INSTALLATION OF MARCAINE/PYRIDIUM;  Surgeon: Fredricka Bonine, MD;  Location: [facility];  Service: Urology;  Laterality: N/A;  . CYSTO WITH HYDRODISTENSION N/A 01/24/2014   Procedure: CYSTOSCOPY/HYDRODISTENSION INSTALLATION OF MARCAINE AND PYRIDIUM;  Surgeon: Malka So, MD;  Location: [facility];  Service: Urology;  Laterality: N/A;  . CYSTO WITH HYDRODISTENSION N/A 08/03/2014   Procedure: CYSTO/HYDRODISTENSION OF BLADDER/INSTILL  PYRIDIUM/MARCAINE;  Surgeon: Malka So, MD;  Location: [facility];  Service: Urology;  Laterality: N/A;  . CYSTO WITH HYDRODISTENSION N/A 11/08/2015   Procedure: CYSTOSCOPY/HYDRODISTENSION;  Surgeon: Irine Seal, MD;  Location: [facility];  Service: Urology;  Laterality: N/A;  . CYSTO WITH HYDRODISTENSION N/A 12/11/2016   Procedure: CYSTOSCOPY/HYDRODISTENSION;  Surgeon: Irine Seal, MD;  Location: [facility];  Service: Urology;  Laterality: N/A;  . CYSTO/  HOD/ INSTILLATION THERAPY  LAST ONE 08-27-2010   MULTIPLE TIMES  . CYSTOSCOPY WITH HYDRODISTENSION AND BIOPSY N/A 07/05/2019   Procedure: CYSTOSCOPY,  HYDRODISTENSION/ INSTILATION MARCAINE AND PYRIDIUM;  Surgeon: Bjorn Pippin, MD;  Location: [facility];  Service: Urology;  Laterality: N/A;  . INCISION AND DRAINAGE HIP Right 01/01/2018   Procedure: IRRIGATION AND DEBRIDEMENT RIGHT HIP INCISION;  Surgeon: Kathryne Hitch, MD;  Location: [facility];  Service: Orthopedics;  Laterality: Right;  . JOINT REPLACEMENT     bilateral hip c. Janyce Ellinger 11-06-17  . TONSILLECTOMY    . TOTAL SHOULDER ARTHROPLASTY Right 04/15/2018  . TOTAL SHOULDER ARTHROPLASTY Right 04/15/2018   Procedure: TOTAL SHOULDER ARTHROPLASTY;  Surgeon: Jones Broom, MD;  Location: [facility];  Service: Orthopedics;  Laterality: Right;  . TOTAL SHOULDER ARTHROPLASTY Left 08/05/2018   Procedure: LEFT TOTAL SHOULDER ARTHROPLASTY;  Surgeon: Jones Broom, MD;  Location: [facility];  Service: Orthopedics;  Laterality: Left;  . TRANSTHORACIC ECHOCARDIOGRAM  07-06-2012   mild LVH, ef 55-65%, grade 1 diastolic dysfunction/  mild AR/  trivial MR and TR  . VAGINAL HYSTERECTOMY  1980'S   complete    No current facility-administered medications for this encounter.   Current Outpatient Medications  Medication Sig Dispense Refill Last Dose  . divalproex (DEPAKOTE) 500 MG DR tablet Take 500 mg by mouth at bedtime.  3   . escitalopram (LEXAPRO) 20 MG tablet Take 20 mg by mouth at bedtime.      . famotidine (PEPCID) 40 MG tablet Take 40 mg by mouth daily.     Marland Kitchen gabapentin (NEURONTIN) 300 MG capsule Take 300 mg by mouth 2 (two) times daily. at bedtime.  1   . levothyroxine (SYNTHROID) 100 MCG tablet Take 100 mcg by mouth daily before breakfast.      . LORazepam (ATIVAN) 0.5 MG tablet Take 0.5 mg by mouth daily as needed for anxiety.      Marland Kitchen omeprazole (PRILOSEC) 20 MG capsule Take 20 mg by mouth every morning.      Marland Kitchen QUEtiapine (SEROQUEL) 200 MG tablet Take 200 mg by mouth at bedtime.     . rizatriptan (MAXALT) 10 MG tablet Take 10 mg by mouth as needed for migraine.   3   .  HYDROcodone-acetaminophen (NORCO) 5-325 MG tablet Take 1 tablet by mouth every 6 (six) hours as needed for moderate pain. (Patient not taking: Reported on 11/14/2019) 6 tablet 0 Not Taking at Unknown time   Facility-Administered Medications Ordered in Other Encounters  Medication Dose Route Frequency Provider Last Rate Last Admin  . bupivacaine (MARCAINE) 0.5 % 15 mL, phenazopyridine (PYRIDIUM) 400 mg bladder mixture   Bladder Instillation Once Bjorn Pippin, MD       Allergies  Allergen Reactions  . Aspirin Other (See Comments)    Gi upset  . Sulfa Antibiotics Rash  . Trilafon [Perphenazine] Rash    TRILA    Social History   Tobacco Use  . Smoking status: Current Every Day Smoker    Packs/day: 0.50    Years: 14.00    Pack years: 7.00    Types: Cigarettes  . Smokeless tobacco: Never Used  Substance Use Topics  . Alcohol use: No    No family history on file.   Review of Systems  Musculoskeletal: Positive for joint swelling.  All other systems reviewed and are negative.   Objective:  Physical Exam  Constitutional: She is oriented to person, place, and time. She appears well-developed and well-nourished.  HENT:  Head: Normocephalic and atraumatic.  Eyes: Pupils are equal, round, and reactive to light. EOM are normal.  Cardiovascular: Normal rate.  Respiratory: Effort normal.  GI: Soft.  Musculoskeletal:     Cervical back: Normal range of motion and neck supple.     Left knee: Effusion and bony tenderness present. Decreased range of motion. Tenderness present over the medial joint line and lateral joint line. Abnormal alignment and abnormal meniscus.  Neurological: She is alert and oriented to person, place, and time.  Skin: Skin is warm and dry.  Psychiatric: She has a normal mood and affect.    Vital signs in last 24 hours:    Labs:   Estimated body mass index is 31.15 kg/m as calculated from the following:   Height as of 11/18/19: 4\' 10"  (1.473 m).   Weight as of  11/18/19: 67.6 kg.   Imaging Review Plain radiographs demonstrate severe degenerative joint disease of the left knee(s). The overall alignment isneutral. The bone quality appears to be good for age and reported activity level.      Assessment/Plan:  End stage arthritis, left knee   The patient history, physical examination, clinical judgment of the provider and imaging studies are consistent with end stage degenerative joint disease of the left knee(s) and total knee arthroplasty is deemed medically necessary. The treatment options including medical management, injection therapy arthroscopy and arthroplasty were discussed at length. The risks and benefits of total knee arthroplasty were presented and reviewed. The risks due to aseptic loosening, infection, stiffness, patella tracking problems, thromboembolic complications and other imponderables were discussed. The patient acknowledged the explanation, agreed to proceed with the plan and consent was signed. Patient is being admitted for inpatient treatment for surgery, pain control, PT, OT, prophylactic antibiotics, VTE prophylaxis, progressive ambulation and ADL's and discharge planning. The patient is planning to be discharged home with home health services

## 2019-11-26 DIAGNOSIS — M1712 Unilateral primary osteoarthritis, left knee: Secondary | ICD-10-CM | POA: Diagnosis not present

## 2019-11-26 LAB — BASIC METABOLIC PANEL
Anion gap: 7 (ref 5–15)
BUN: 19 mg/dL (ref 8–23)
CO2: 25 mmol/L (ref 22–32)
Calcium: 8.9 mg/dL (ref 8.9–10.3)
Chloride: 107 mmol/L (ref 98–111)
Creatinine, Ser: 1 mg/dL (ref 0.44–1.00)
GFR calc Af Amer: >60 mL/min (ref >60)
GFR calc non Af Amer: 60 mL/min — ABNORMAL LOW (ref >60)
Glucose, Bld: 120 mg/dL — ABNORMAL HIGH (ref 70–99)
Potassium: 4.3 mmol/L (ref 3.5–5.1)
Sodium: 139 mmol/L (ref 135–145)

## 2019-11-26 LAB — CBC
HCT: 32.4 % — ABNORMAL LOW (ref 36.0–46.0)
Hemoglobin: 9.3 g/dL — ABNORMAL LOW (ref 12.0–15.0)
MCH: 25.8 pg — ABNORMAL LOW (ref 26.0–34.0)
MCHC: 28.7 g/dL — ABNORMAL LOW (ref 30.0–36.0)
MCV: 90 fL (ref 80.0–100.0)
Platelets: 288 10*3/uL (ref 150–400)
RBC: 3.6 MIL/uL — ABNORMAL LOW (ref 3.87–5.11)
RDW: 17.2 % — ABNORMAL HIGH (ref 11.5–15.5)
WBC: 13.5 10*3/uL — ABNORMAL HIGH (ref 4.0–10.5)
nRBC: 0 % (ref 0.0–0.2)

## 2019-11-26 MED ORDER — OXYCODONE HCL 5 MG PO TABS: 5.0000 mg | ORAL_TABLET | ORAL | 0 refills | Status: DC | PRN

## 2019-11-26 MED ORDER — METHOCARBAMOL 500 MG PO TABS: 500.0000 mg | ORAL_TABLET | Freq: Four times a day (QID) | ORAL | 0 refills | Status: DC | PRN

## 2019-11-26 MED ORDER — ONDANSETRON 4 MG PO TBDP: 4.0000 mg | ORAL_TABLET | Freq: Three times a day (TID) | ORAL | 0 refills | Status: AC | PRN

## 2019-11-26 MED ORDER — ASPIRIN 81 MG PO CHEW: 81.0000 mg | CHEWABLE_TABLET | Freq: Two times a day (BID) | ORAL | 0 refills | Status: DC

## 2019-11-26 NOTE — Discharge Summary (Signed)
Patient ID: Kaitlyn [name] MRN: [id number] DOB/AGE: 1956/04/07 64 y.o.  Admit date: 11/25/2019 Discharge date: 11/26/2019  Admission Diagnoses:  Principal Problem:   Unilateral primary osteoarthritis, left knee Active Problems:   Status post total left knee replacement   Discharge Diagnoses:  Same  Past Medical History:  Diagnosis Date  . Anxiety   . Arthritis    oa  . Depression   . Frequency of urination   . GERD (gastroesophageal reflux disease)    CONTROLLED W/ PRILOSEC  . Headache    hx of migraines  . Hyperlipemia   . Hypothyroidism   . Incontinence of urine   . Insomnia   . Interstitial cystitis   . Pelvic pain syndrome I.C.  . PONV (postoperative nausea and vomiting)   . Urgency of urination   . Wears dentures    UPPER  . Wears glasses     Surgeries: Procedure(s): LEFT TOTAL KNEE ARTHROPLASTY on 11/25/2019   Consultants:   Discharged Condition: Improved  Hospital Course: Kaitlyn [name] is an 64 y.o. female who was admitted 11/25/2019 for operative treatment ofUnilateral primary osteoarthritis, left knee. Patient has severe unremitting pain that affects sleep, daily activities, and work/hobbies. After pre-op clearance the patient was taken to the operating room on 11/25/2019 and underwent  Procedure(s): LEFT TOTAL KNEE ARTHROPLASTY.    Patient was given perioperative antibiotics:  Anti-infectives (From admission, onward)   Start     Dose/Rate Route Frequency Ordered Stop   11/25/19 1800  ceFAZolin (ANCEF) IVPB 1 g/50 mL premix     1 g 100 mL/hr over 30 Minutes Intravenous Every 6 hours 11/25/19 1449 11/26/19 0146   11/25/19 0845  ceFAZolin (ANCEF) IVPB 2g/100 mL premix     2 g 200 mL/hr over 30 Minutes Intravenous On call to O.R. 11/25/19 4944 11/25/19 1138       Patient was given sequential compression devices, early ambulation, and chemoprophylaxis to prevent DVT.  Patient benefited maximally from hospital stay and there were no complications.     Recent vital signs:  Patient Vitals for the past 24 hrs:  BP Temp Temp src Pulse Resp SpO2  11/26/19 0946 116/70 98.4 F (36.9 C) Oral 94 18 93 %  11/26/19 0545 (!) 129/93 97.8 F (36.6 C) Oral 94 14 96 %  11/26/19 0154 108/78 97.7 F (36.5 C) Oral 88 16 96 %  11/25/19 2138 140/85 98.3 F (36.8 C) Oral 93 14 97 %  11/25/19 1753 109/74 98.3 F (36.8 C) -- 92 18 93 %  11/25/19 1720 121/82 97.7 F (36.5 C) -- 97 16 92 %  11/25/19 1626 121/73 (!) 97.5 F (36.4 C) -- 82 14 96 %  11/25/19 1451 131/79 97.8 F (36.6 C) -- 68 18 100 %  11/25/19 1440 133/77 -- -- 71 11 98 %  11/25/19 1430 132/83 98.1 F (36.7 C) -- 68 15 97 %  11/25/19 1415 133/74 -- -- 73 16 98 %  11/25/19 1400 130/78 -- -- 70 14 98 %  11/25/19 1345 126/77 -- -- 72 10 100 %  11/25/19 1330 125/78 -- -- 72 10 100 %  11/25/19 1322 113/68 (!) 97.5 F (36.4 C) -- 74 13 99 %     Recent laboratory studies:  Recent Labs    11/26/19 0416  WBC 13.5*  HGB 9.3*  HCT 32.4*  PLT 288  NA 139  K 4.3  CL 107  CO2 25  BUN 19  CREATININE 1.00  GLUCOSE 120*  CALCIUM 8.9     Discharge Medications:   Allergies as of 11/26/2019      Reactions   Aspirin Other (See Comments)   Gi upset   Sulfa Antibiotics Rash   Trilafon [perphenazine] Rash   TRILA      Medication List    STOP taking these medications   HYDROcodone-acetaminophen 5-325 MG tablet Commonly known as: Norco     TAKE these medications   aspirin 81 MG chewable tablet Chew 1 tablet (81 mg total) by mouth 2 (two) times daily.   divalproex 500 MG DR tablet Commonly known as: DEPAKOTE Take 500 mg by mouth at bedtime.   escitalopram 20 MG tablet Commonly known as: LEXAPRO Take 20 mg by mouth at bedtime.   famotidine 40 MG tablet Commonly known as: PEPCID Take 40 mg by mouth daily.   gabapentin 300 MG capsule Commonly known as: NEURONTIN Take 300 mg by mouth 2 (two) times daily. at bedtime.   levothyroxine 100 MCG tablet Commonly known as:  SYNTHROID Take 100 mcg by mouth daily before breakfast.   LORazepam 0.5 MG tablet Commonly known as: ATIVAN Take 0.5 mg by mouth daily as needed for anxiety.   methocarbamol 500 MG tablet Commonly known as: ROBAXIN Take 1 tablet (500 mg total) by mouth every 6 (six) hours as needed for muscle spasms.   omeprazole 20 MG capsule Commonly known as: PRILOSEC Take 20 mg by mouth every morning.   ondansetron 4 MG disintegrating tablet Commonly known as: Zofran ODT Take 1 tablet (4 mg total) by mouth every 8 (eight) hours as needed for nausea or vomiting.   oxyCODONE 5 MG immediate release tablet Commonly known as: Oxy IR/ROXICODONE Take 1-2 tablets (5-10 mg total) by mouth every 4 (four) hours as needed for moderate pain (pain score 4-6).   QUEtiapine 200 MG tablet Commonly known as: SEROQUEL Take 200 mg by mouth at bedtime.   rizatriptan 10 MG tablet Commonly known as: MAXALT Take 10 mg by mouth as needed for migraine.            Durable Medical Equipment  (From admission, onward)         Start     Ordered   11/25/19 1449  DME 3 n 1  Once     11/25/19 1449   11/25/19 1449  DME Walker rolling  Once    Question Answer Comment  Walker: With 5 Inch Wheels   Patient needs a walker to treat with the following condition Status post total left knee replacement      11/25/19 1449          Diagnostic Studies: DG Knee Left Port  Result Date: 11/25/2019 CLINICAL DATA:  Status post left knee total arthroplasty EXAM: PORTABLE LEFT KNEE - 1-2 VIEW COMPARISON:  10/19/2019 FINDINGS: Status post left knee total arthroplasty with expected overlying postoperative findings. No evidence of perihardware fracture or component malpositioning. IMPRESSION: Status post left knee total arthroplasty with expected overlying postoperative findings. No evidence of perihardware fracture or component malpositioning. Electronically Signed   By: Lauralyn Primes M.D.   On: 11/25/2019 13:50     Disposition: Discharge disposition: 01-Home or Self Care         Follow-up Information    Kathryne Hitch, MD Follow up in 2 week(s).   Specialty: Orthopedic Surgery Contact information: [address] [place] [address] [phone]            Signed: Kathryne Hitch 11/26/2019, 11:50 AM

## 2019-11-26 NOTE — Progress Notes (Signed)
Pt provided with d/c instructions. Husband present. After discussing the pt's plan of care upon d/c home, the pt denied any further questions or concerns.

## 2019-11-26 NOTE — Progress Notes (Signed)
  Subjective: Kaitlyn [name] is a 64 y.o. female s/p left TKA.  They are POD1.  Pt's pain is controlled.  Pt denies numbness/tingling/weakness.  Pt has ambulated with some difficulty.  Has not worked with PT yet.     Objective: Vital signs in last 24 hours: Temp:  [97.5 F (36.4 C)-98.4 F (36.9 C)] 98.4 F (36.9 C) (03/13 0946) Pulse Rate:  [68-97] 94 (03/13 0946) Resp:  [8-18] 18 (03/13 0946) BP: (108-144)/(68-93) 116/70 (03/13 0946) SpO2:  [92 %-100 %] 93 % (03/13 0946)  Intake/Output from previous day: 03/12 0701 - 03/13 0700 In: 3749.9 [P.O.:540; I.V.:2909.9; IV Piggyback:300] Out: 1475 [Urine:1450; Blood:25] Intake/Output this shift: Total I/O In: 469.6 [P.O.:240; I.V.:229.6] Out: -   Exam:  No gross blood or drainage overlying the dressing 2+ DP pulse Sensation intact distally in the left foot Able to dorsiflex and plantarflex the left foot   Labs: Recent Labs    11/26/19 0416  HGB 9.3*   Recent Labs    11/26/19 0416  WBC 13.5*  RBC 3.60*  HCT 32.4*  PLT 288   Recent Labs    11/26/19 0416  NA 139  K 4.3  CL 107  CO2 25  BUN 19  CREATININE 1.00  GLUCOSE 120*  CALCIUM 8.9   No results for input(s): LABPT, INR in the last 72 hours.  Assessment/Plan: Pt is POD1 s/p left TKA.    -Postoperative soft dressing replaced with aquacel bandage  -Plan to discharge to home today or tomorrow pending patient's pain and PT eval  -WBAT with a walker  -Okay to shower, dressing is waterproof.  Cautioned patient against soaking dressing in bath/pool/body of water  -Encouraged the use of the blue cradle boot to work on extension.  Cautioned patient against using a pillow under their knee.     Kaitlyn [name] 11/26/2019, 9:51 AM

## 2019-11-26 NOTE — Progress Notes (Signed)
Patient ID: Kaitlyn [name], female   DOB: Dec 01, 1955, 64 y.o.   MRN: [id number] She wants to go home this afternoon if she does well with PT.

## 2019-11-26 NOTE — Progress Notes (Signed)
Physical Therapy Treatment Patient Details Name: Kaitlyn [name] MRN: [id number] DOB: May 11, 1956 Today's Date: 11/26/2019    History of Present Illness Patient is 64 y.o. female s/p Lt TKA on 11/25/19 with PMH significant for HLD, hypothyroidism, GERD, OA, depression, anxiety, bil THA, bil TSA.     PT Comments    Progressing with mobility. Reviewed/practiced exercises, gait training, and stair training. Issued HEP for pt to perform 2x/day until HHPT begins. Instructed husband to use gait belt and to assist pt will any and all mobility tasks. Pt met her PT goals but she is drowsy. All education completed. Okay to d/c from PT standpoint.     Follow Up Recommendations  Follow surgeon's recommendation for DC plan and follow-up therapies; 24 hour supervision/assist     Equipment Recommendations  None recommended by PT    Recommendations for Other Services       Precautions / Restrictions Precautions Precautions: Fall Restrictions Weight Bearing Restrictions: No LLE Weight Bearing: Weight bearing as tolerated    Mobility  Bed Mobility Overal bed mobility: Needs Assistance Bed Mobility: Supine to Sit     Supine to sit: Min guard;HOB elevated     General bed mobility comments: increased time. cues for safety.  Transfers Overall transfer level: Needs assistance Equipment used: Rolling walker (2 wheeled) Transfers: Sit to/from Stand Sit to Stand: Min assist         General transfer comment: Assist to rise, stabilize, control descent. VCs safety, hand placement.  Ambulation/Gait Ambulation/Gait assistance: Min assist Gait Distance (Feet): 115 Feet Assistive device: Rolling walker (2 wheeled) Gait Pattern/deviations: Step-to pattern;Step-through pattern;Decreased stride length     General Gait Details: Assist to steady throughout distance. VCs safety, sequence.   Stairs Stairs: Yes Stairs assistance: Min assist Stair Management: Forwards;One rail Right;With cane;Step  to pattern Number of Stairs: 2 General stair comments: up and over portable stairs. Vcs safety, technique, sequence. Used 1 rail + cane. Assist to stabilize. Husband present to observe/assist.   Wheelchair Mobility    Modified Rankin (Stroke Patients Only)       Balance Overall balance assessment: Needs assistance         Standing balance support: Bilateral upper extremity supported Standing balance-Leahy Scale: Poor                              Cognition Arousal/Alertness: Awake/alert Behavior During Therapy: WFL for tasks assessed/performed Overall Cognitive Status: Within Functional Limits for tasks assessed                                        Exercises Total Joint Exercises Ankle Circles/Pumps: AROM;Both;10 reps Quad Sets: AROM;Both;10 reps Heel Slides: AAROM;Left;10 reps Hip ABduction/ADduction: AAROM;Left;10 reps Straight Leg Raises: AAROM;Left;10 reps;AROM Goniometric ROM: ~10-70 degrees    General Comments        Pertinent Vitals/Pain Pain Assessment: 0-10 Pain Score: 7  Pain Location: L knee Pain Descriptors / Indicators: Sore;Aching Pain Intervention(s): Monitored during session;Repositioned    Home Living                      Prior Function            PT Goals (current goals can now be found in the care plan section) Progress towards PT goals: Progressing toward goals    Frequency  7X/week      PT Plan Current plan remains appropriate    Co-evaluation              AM-PAC PT "6 Clicks" Mobility   Outcome Measure  Help needed turning from your back to your side while in a flat bed without using bedrails?: A Little Help needed moving from lying on your back to sitting on the side of a flat bed without using bedrails?: A Little Help needed moving to and from a bed to a chair (including a wheelchair)?: A Little Help needed standing up from a chair using your arms (e.g., wheelchair or  bedside chair)?: A Little Help needed to walk in hospital room?: A Little Help needed climbing 3-5 steps with a railing? : A Little 6 Click Score: 18    End of Session Equipment Utilized During Treatment: Gait belt Activity Tolerance: Patient tolerated treatment well Patient left: in bed;with call bell/phone within reach;with family/visitor present   PT Visit Diagnosis: Other abnormalities of gait and mobility (R26.89);Pain Pain - Right/Left: Left Pain - part of body: Knee     Time: 1324-1350 PT Time Calculation (min) (ACUTE ONLY): 26 min  Charges:  $Gait Training: 8-22 mins $Therapeutic Exercise: 8-22 mins                         Doreatha Massed, PT Acute Rehabilitation

## 2019-11-26 NOTE — TOC Progression Note (Signed)
Transition of Care [facility]) - Progression Note    Patient Details  Name: Kaitlyn [name] MRN: [id number] Date of Birth: 1956/07/26  Transition of Care [facility]) CM/SW Contact  Armanda Heritage, RN Phone Number: 11/26/2019, 1:54 PM  Clinical Narrative:  CM spoke with patient at bedside. Patient set up with [facility] for HHPT. Patient reports she has rolling walker and 3in1 at home.       Expected Discharge Plan: Home w Home Health Services Barriers to Discharge: No Barriers Identified  Expected Discharge Plan and Services Expected Discharge Plan: Home w Home Health Services   Discharge Planning Services: CM Consult Post Acute Care Choice: Home Health Living arrangements for the past 2 months: Single Family Home Expected Discharge Date: 11/26/19               DME Arranged: N/A DME Agency: NA       HH Arranged: PT HH Agency: [facility] Date [facility] Agency Contacted: 11/26/19 Time HH Agency Contacted: 1354 Representative spoke with at [facility] Agency: Denyse Amass   Social Determinants of Health (SDOH) Interventions    Readmission Risk Interventions No flowsheet data found.

## 2019-11-26 NOTE — Discharge Instructions (Signed)

## 2019-11-28 ENCOUNTER — Encounter: Payer: Self-pay | Admitting: *Deleted

## 2019-12-02 ENCOUNTER — Telehealth: Payer: Self-pay

## 2019-12-02 ENCOUNTER — Other Ambulatory Visit: Payer: Self-pay | Admitting: Orthopaedic Surgery

## 2019-12-02 MED ORDER — OXYCODONE HCL 5 MG PO TABS: 5.0000 mg | ORAL_TABLET | ORAL | 0 refills | Status: DC | PRN

## 2019-12-02 NOTE — Telephone Encounter (Signed)
I called patient to advise, no answer. Voicemail did not specify whose phone. Did not leave message.

## 2019-12-02 NOTE — Telephone Encounter (Signed)
Please advise 

## 2019-12-02 NOTE — Telephone Encounter (Signed)
I sent some in 

## 2019-12-02 NOTE — Telephone Encounter (Signed)
HHPT called and states that the pt has 3 oxycodone 5 mg left and was wanting to know if she could have a few days worth of a refill due to the pt having some increased pain. Advised to call the pt and discuss if this is possible .

## 2019-12-08 ENCOUNTER — Encounter: Payer: Self-pay | Admitting: Physician Assistant

## 2019-12-08 ENCOUNTER — Ambulatory Visit (INDEPENDENT_AMBULATORY_CARE_PROVIDER_SITE_OTHER): Payer: Medicare HMO | Admitting: Physician Assistant

## 2019-12-08 ENCOUNTER — Other Ambulatory Visit: Payer: Self-pay

## 2019-12-08 DIAGNOSIS — Z96652 Presence of left artificial knee joint: Secondary | ICD-10-CM

## 2019-12-08 MED ORDER — OXYCODONE HCL 5 MG PO TABS: 5.0000 mg | ORAL_TABLET | ORAL | 0 refills | Status: DC | PRN

## 2019-12-08 NOTE — Progress Notes (Signed)
     HPI: Kaitlyn [name] returns 2-week status post left total knee arthroplasty.  She is overall doing well.  She states she has more pain in the morning night.  Feels therapy is going well.  Ambulating with a cane.  She has had no fevers, chills, shortness of breath, or chest pain.  Physical exam: Left knee full extension flexion to 105 to 110 degrees.  No instability valgus varus stressing.  Surgical incisions well approximated with staples no signs of dehiscence or infection.  Left calf supple nontender.  Dorsiflexion plantarflexion left ankle intact.  Ambulates with a slight antalgic gait with use cane in the right hand.  Impression: Status post left total knee arthroplasty 11/25/2019  Plan: Staples removed Steri-Strips applied.  She is given a prescription for outpatient physical therapy.  She will continue with her aspirin for DVT prophylaxis once daily for another week and then discontinue.  Questions were encouraged and answered.  Follow-up in 4 weeks sooner if there is any questions or concerns.

## 2019-12-18 ENCOUNTER — Other Ambulatory Visit: Payer: Self-pay | Admitting: Orthopaedic Surgery

## 2019-12-19 NOTE — Telephone Encounter (Signed)
Continue

## 2019-12-27 ENCOUNTER — Other Ambulatory Visit: Payer: Self-pay | Admitting: Orthopaedic Surgery

## 2019-12-27 ENCOUNTER — Telehealth: Payer: Self-pay | Admitting: Orthopaedic Surgery

## 2019-12-27 MED ORDER — OXYCODONE HCL 5 MG PO TABS: 5.0000 mg | ORAL_TABLET | ORAL | 0 refills | Status: DC | PRN

## 2019-12-27 NOTE — Telephone Encounter (Signed)
Patient called requesting an RX refill on her Oxycodone.  [facility] [place]  CB#[phone].  Thank you.

## 2019-12-27 NOTE — Telephone Encounter (Signed)
Please advise 

## 2020-01-05 ENCOUNTER — Ambulatory Visit: Payer: Medicare HMO | Admitting: Physician Assistant

## 2020-01-09 ENCOUNTER — Other Ambulatory Visit: Payer: Self-pay

## 2020-01-09 ENCOUNTER — Encounter: Payer: Self-pay | Admitting: Physician Assistant

## 2020-01-09 ENCOUNTER — Ambulatory Visit (INDEPENDENT_AMBULATORY_CARE_PROVIDER_SITE_OTHER): Payer: Medicare HMO | Admitting: Physician Assistant

## 2020-01-09 DIAGNOSIS — Z96652 Presence of left artificial knee joint: Secondary | ICD-10-CM

## 2020-01-09 MED ORDER — OXYCODONE HCL 5 MG PO TABS: 5.0000 mg | ORAL_TABLET | ORAL | 0 refills | Status: DC | PRN

## 2020-01-09 MED ORDER — METHOCARBAMOL 500 MG PO TABS: 500.0000 mg | ORAL_TABLET | Freq: Four times a day (QID) | ORAL | 0 refills | Status: DC | PRN

## 2020-01-09 NOTE — Progress Notes (Signed)
HPI: [name] returns today 6 weeks status post left total knee arthroplasty.  She is overall doing well.  She is having pain particularly after therapy.  She is asking for refill on her oxycodone and her muscle relaxant.  She is ambulating with a cane.  She states she has had no shortness of breath fevers chills.  Physical exam left knee full extension flexion to 110 out of 15 degrees.  No instability valgus varus stressing.  No calf pain calf supple.  Surgical incisions healing well no signs of infection.  Impression: Status post left total knee arthroplasty 11/25/2019  Plan: She will continue work with therapy on range of motion strengthening.  Refill on oxycodone and Robaxin was given.  She will follow-up with [place] in 1 month at that time she is wanting to schedule right total knee arthroplasty.  Questions were encouraged and answered

## 2020-01-11 ENCOUNTER — Ambulatory Visit: Payer: Medicare HMO | Admitting: Physician Assistant

## 2020-01-25 ENCOUNTER — Telehealth: Payer: Self-pay | Admitting: Orthopaedic Surgery

## 2020-01-25 MED ORDER — OXYCODONE HCL 5 MG PO TABS: 5.0000 mg | ORAL_TABLET | Freq: Four times a day (QID) | ORAL | 0 refills | Status: DC | PRN

## 2020-01-25 MED ORDER — METHOCARBAMOL 500 MG PO TABS: 500.0000 mg | ORAL_TABLET | Freq: Four times a day (QID) | ORAL | 0 refills | Status: DC | PRN

## 2020-01-25 NOTE — Telephone Encounter (Signed)
Pt called stating she needed a refill of her oxycodone prescription and her muscle relaxers. Pt would like a call back when this has been completed.    [phone]

## 2020-01-25 NOTE — Telephone Encounter (Signed)
Please advise 

## 2020-01-25 NOTE — Telephone Encounter (Signed)
I sent in some more medication for her.

## 2020-02-06 ENCOUNTER — Ambulatory Visit: Payer: Medicare HMO | Admitting: Physician Assistant

## 2020-02-06 ENCOUNTER — Other Ambulatory Visit: Payer: Self-pay

## 2020-02-06 ENCOUNTER — Encounter: Payer: Self-pay | Admitting: Physician Assistant

## 2020-02-06 VITALS — Ht 60.24 in | Wt 146.8 lb

## 2020-02-06 DIAGNOSIS — M1711 Unilateral primary osteoarthritis, right knee: Secondary | ICD-10-CM

## 2020-02-06 NOTE — Progress Notes (Signed)
HPI: [name] returns today to discuss undergoing right total knee arthroplasty.  She is now 10 weeks status post left total knee arthroplasty.  He is overall doing well some soreness in the posterior aspect of the knee.  No calf swelling no shortness of breath chest pain.  She unfortunately has been found to have some blood in her stool and she is anemic she is needing to have this worked up prior to undergoing right knee surgery.  Physical exam: Left knee good range of motion.  Surgical incisions healing well no signs of infection.  Left popliteal region with some slight soreness there is no significant fullness.  No erythema no effusion abnormal warmth about the left knee.  Right knee full extension full flexion no instability valgus varus stressing.  Tenderness along medial joint line.  Impression: Right knee tricompartmental arthritis Status post left total knee arthroplasty 11/25/2019  Plan: She will continue work on range of motion strengthening left knee.  In regards to scheduling for right total knee arthroplasty will give her Domenica Reamer card when she has clearance from her gastroenterologist we will proceed with surgery.  Questions were encouraged and answered at length.  She will follow-up with [place] 2 weeks postop.

## 2020-02-08 ENCOUNTER — Telehealth: Payer: Self-pay | Admitting: Orthopaedic Surgery

## 2020-02-08 MED ORDER — OXYCODONE HCL 5 MG PO TABS: 5.0000 mg | ORAL_TABLET | Freq: Four times a day (QID) | ORAL | 0 refills | Status: DC | PRN

## 2020-02-08 NOTE — Telephone Encounter (Signed)
Please advise 

## 2020-02-08 NOTE — Telephone Encounter (Signed)
Patient called needing Rx refilled Oxycodone. The number to contact patient is [phone] 

## 2020-02-13 ENCOUNTER — Other Ambulatory Visit: Payer: Self-pay | Admitting: Orthopaedic Surgery

## 2020-02-14 ENCOUNTER — Other Ambulatory Visit: Payer: Self-pay | Admitting: Orthopaedic Surgery

## 2020-02-14 NOTE — Telephone Encounter (Signed)
Please advise 

## 2020-02-24 ENCOUNTER — Telehealth: Payer: Self-pay | Admitting: Orthopaedic Surgery

## 2020-02-24 ENCOUNTER — Telehealth: Payer: Self-pay | Admitting: Physician Assistant

## 2020-02-24 ENCOUNTER — Other Ambulatory Visit: Payer: Self-pay | Admitting: Orthopaedic Surgery

## 2020-02-24 MED ORDER — CYCLOBENZAPRINE HCL 5 MG PO TABS: 5.0000 mg | ORAL_TABLET | Freq: Three times a day (TID) | ORAL | 0 refills | Status: DC | PRN

## 2020-02-24 MED ORDER — OXYCODONE HCL 5 MG PO TABS: 5.0000 mg | ORAL_TABLET | Freq: Four times a day (QID) | ORAL | 0 refills | Status: DC | PRN

## 2020-02-24 MED ORDER — METHOCARBAMOL 500 MG PO TABS: 500.0000 mg | ORAL_TABLET | Freq: Four times a day (QID) | ORAL | 1 refills | Status: DC | PRN

## 2020-02-24 NOTE — Telephone Encounter (Signed)
Please advise 

## 2020-02-24 NOTE — Telephone Encounter (Signed)
Patient called.   She needs a refill on her oxycodone and muscle refill.   Call back: [phone]

## 2020-02-24 NOTE — Telephone Encounter (Signed)
Patient called advised she need Rx sent to her pharmacy (Methocarbamol) The Flexeril did not work. The number to contact patient is [phone]

## 2020-03-27 ENCOUNTER — Other Ambulatory Visit: Payer: Self-pay | Admitting: Orthopaedic Surgery

## 2020-03-30 ENCOUNTER — Telehealth: Payer: Self-pay | Admitting: Orthopaedic Surgery

## 2020-03-30 ENCOUNTER — Other Ambulatory Visit: Payer: Self-pay | Admitting: Orthopaedic Surgery

## 2020-03-30 MED ORDER — HYDROCODONE-ACETAMINOPHEN 5-325 MG PO TABS: 1.0000 | ORAL_TABLET | Freq: Four times a day (QID) | ORAL | 0 refills | Status: DC | PRN

## 2020-03-30 NOTE — Telephone Encounter (Signed)
Pls advise.  

## 2020-03-30 NOTE — Telephone Encounter (Signed)
Patient called. She would like a refill on hydrocodone called in. Her call back number is [phone]

## 2020-04-05 ENCOUNTER — Other Ambulatory Visit: Payer: Self-pay | Admitting: Physician Assistant

## 2020-04-05 ENCOUNTER — Telehealth: Payer: Self-pay | Admitting: Orthopaedic Surgery

## 2020-04-05 MED ORDER — HYDROCODONE-ACETAMINOPHEN 5-325 MG PO TABS: 1.0000 | ORAL_TABLET | Freq: Four times a day (QID) | ORAL | 0 refills | Status: DC | PRN

## 2020-04-05 NOTE — Telephone Encounter (Signed)
Rx refilled.

## 2020-04-05 NOTE — Telephone Encounter (Signed)
Patient called requesting a refill of hydrocodone. Patient says pharmacy never received prescription. Please contact pharmacy and [facility] Please call patient about this matter at [phone].

## 2020-04-05 NOTE — Telephone Encounter (Signed)
Can you please see message below. Pt was given rx on 03/30/20 but this was not filled pt is asking if you can resend?

## 2020-04-18 ENCOUNTER — Other Ambulatory Visit: Payer: Self-pay | Admitting: Orthopaedic Surgery

## 2020-05-04 ENCOUNTER — Emergency Department (HOSPITAL_COMMUNITY)
Admission: EM | Admit: 2020-05-04 | Discharge: 2020-05-04 | Disposition: A | Discharge status: Home / Self Care | Payer: Medicare HMO | Attending: Emergency Medicine | Admitting: Emergency Medicine

## 2020-05-04 ENCOUNTER — Other Ambulatory Visit: Payer: Self-pay

## 2020-05-04 ENCOUNTER — Emergency Department (HOSPITAL_COMMUNITY): Payer: Medicare HMO

## 2020-05-04 DIAGNOSIS — G2402 Drug induced acute dystonia: Secondary | ICD-10-CM | POA: Insufficient documentation

## 2020-05-04 DIAGNOSIS — I10 Essential (primary) hypertension: Secondary | ICD-10-CM | POA: Diagnosis not present

## 2020-05-04 DIAGNOSIS — F17210 Nicotine dependence, cigarettes, uncomplicated: Secondary | ICD-10-CM | POA: Insufficient documentation

## 2020-05-04 DIAGNOSIS — Z7982 Long term (current) use of aspirin: Secondary | ICD-10-CM | POA: Insufficient documentation

## 2020-05-04 DIAGNOSIS — Z96652 Presence of left artificial knee joint: Secondary | ICD-10-CM | POA: Diagnosis not present

## 2020-05-04 DIAGNOSIS — Y999 Unspecified external cause status: Secondary | ICD-10-CM | POA: Diagnosis not present

## 2020-05-04 DIAGNOSIS — R6884 Jaw pain: Secondary | ICD-10-CM | POA: Insufficient documentation

## 2020-05-04 DIAGNOSIS — Z79890 Hormone replacement therapy (postmenopausal): Secondary | ICD-10-CM | POA: Diagnosis not present

## 2020-05-04 DIAGNOSIS — Z96643 Presence of artificial hip joint, bilateral: Secondary | ICD-10-CM | POA: Diagnosis not present

## 2020-05-04 DIAGNOSIS — S0993XA Unspecified injury of face, initial encounter: Secondary | ICD-10-CM | POA: Diagnosis present

## 2020-05-04 DIAGNOSIS — E039 Hypothyroidism, unspecified: Secondary | ICD-10-CM | POA: Insufficient documentation

## 2020-05-04 DIAGNOSIS — X58XXXA Exposure to other specified factors, initial encounter: Secondary | ICD-10-CM | POA: Insufficient documentation

## 2020-05-04 DIAGNOSIS — Y929 Unspecified place or not applicable: Secondary | ICD-10-CM | POA: Diagnosis not present

## 2020-05-04 DIAGNOSIS — Y939 Activity, unspecified: Secondary | ICD-10-CM | POA: Insufficient documentation

## 2020-05-04 DIAGNOSIS — Z79899 Other long term (current) drug therapy: Secondary | ICD-10-CM | POA: Diagnosis not present

## 2020-05-04 MED ORDER — BENZTROPINE MESYLATE 1 MG/ML IJ SOLN
1.0000 mg | Freq: Once | INTRAMUSCULAR | Status: AC
  Administered 2020-05-04: 1 mg via INTRAVENOUS
  Filled 2020-05-04: qty 2

## 2020-05-04 MED ORDER — IBUPROFEN 600 MG PO TABS: 600.0000 mg | ORAL_TABLET | Freq: Four times a day (QID) | ORAL | 0 refills | Status: DC | PRN

## 2020-05-04 MED ORDER — HYDROMORPHONE HCL 1 MG/ML IJ SOLN
0.5000 mg | Freq: Once | INTRAMUSCULAR | Status: AC
  Administered 2020-05-04: 0.5 mg via INTRAVENOUS
  Filled 2020-05-04: qty 1

## 2020-05-04 MED ORDER — CYCLOBENZAPRINE HCL 10 MG PO TABS: 10.0000 mg | ORAL_TABLET | Freq: Two times a day (BID) | ORAL | 0 refills | Status: DC | PRN

## 2020-05-04 MED ORDER — HYDROMORPHONE HCL 1 MG/ML IJ SOLN
1.0000 mg | Freq: Once | INTRAMUSCULAR | Status: AC
  Administered 2020-05-04: 1 mg via INTRAVENOUS
  Filled 2020-05-04: qty 1

## 2020-05-04 MED ORDER — LORAZEPAM 2 MG/ML IJ SOLN
0.5000 mg | Freq: Once | INTRAMUSCULAR | Status: AC
  Administered 2020-05-04: 0.5 mg via INTRAVENOUS
  Filled 2020-05-04: qty 1

## 2020-05-04 MED ORDER — DIPHENHYDRAMINE HCL 50 MG/ML IJ SOLN
50.0000 mg | Freq: Once | INTRAMUSCULAR | Status: AC
  Administered 2020-05-04: 50 mg via INTRAVENOUS
  Filled 2020-05-04: qty 1

## 2020-05-04 NOTE — ED Provider Notes (Signed)
[facility]-EMERGENCY DEPT Provider Note   CSN: [id number] Arrival date & time: 05/04/20  1818     History Chief Complaint  Patient presents with  . Facial Injury    "lock jaw"  . Dislocation    CATILYN [name] is a 64 y.o. female.  The history is provided by the patient. No language interpreter was used.  Facial Injury    64 year old female with history of prior jaw dislocation presenting complaining of jaw pain.  Patient states she has been yawning a lot today and have heard a lot of popping in her jaw.  Approximately 45 minutes ago, she felt her jaw "locked up" since then, she is having difficulty opening her jaw and endorse significant pain.  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  She denies any recent injury.  No fever no trouble swallowing no neck pain no chest pain.  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  She was noted to have high blood pressure, patient denies history of hypertension.  No specific treatment tried.  Patient has not been vaccinated for Covid.  She is unable to recall her last tetanus status.  Past Medical History:  Diagnosis Date  . Anxiety   . Arthritis    oa  . Depression   . Frequency of urination   . GERD (gastroesophageal reflux disease)    CONTROLLED W/ PRILOSEC  . Headache    hx of migraines  . Hyperlipemia   . Hypothyroidism   . Incontinence of urine   . Insomnia   . Interstitial cystitis   . Pelvic pain syndrome I.C.  . PONV (postoperative nausea and vomiting)   . Urgency of urination   . Wears dentures    UPPER  . Wears glasses     Patient Active Problem List   Diagnosis Date Noted  . Unilateral primary osteoarthritis, left knee 11/25/2019  . Status post total left knee replacement 11/25/2019  . S/P shoulder replacement, left 08/05/2018  . Status post total shoulder  arthroplasty, right 04/15/2018  . Cellulitis of right hip 01/01/2018  . Avascular necrosis of bones of both hips (HCC) 11/06/2017  . Status post bilateral total hip replacement 11/06/2017  . Bilateral hip pain 08/31/2017  . Avascular necrosis of hip, left (HCC) 08/31/2017  . Avascular necrosis of hip, right (HCC) 08/31/2017    Past Surgical History:  Procedure Laterality Date  . BILATERAL ANTERIOR TOTAL HIP ARTHROPLASTY Bilateral 11/06/2017   Procedure: BILATERAL TOTAL HIP ARTHROPLASTY ANTERIOR APPROACH;  Surgeon: Kathryne Hitch, MD;  Location: [facility];  Service: Orthopedics;  Laterality: Bilateral;  . CYSTO WITH HYDRODISTENSION  09/18/2011   Procedure: CYSTOSCOPY/HYDRODISTENSION;  Surgeon: Anner Crete;  Location: [facility];  Service: Urology;  Laterality: N/A;  Instillation of Marcaine & Pyridium  . CYSTO WITH HYDRODISTENSION N/A 05/31/2013   Procedure: CYSTOSCOPY/HYDRODISTENSION INSTALLATION OF MARCAINE/PYRIDIUM;  Surgeon: Antony Haste, MD;  Location: [facility];  Service: Urology;  Laterality: N/A;  . CYSTO WITH HYDRODISTENSION N/A 01/24/2014   Procedure: CYSTOSCOPY/HYDRODISTENSION INSTALLATION OF MARCAINE AND PYRIDIUM;  Surgeon: Anner Crete, MD;  Location: [facility];  Service: Urology;  Laterality: N/A;  . CYSTO WITH HYDRODISTENSION N/A 08/03/2014   Procedure: CYSTO/HYDRODISTENSION OF BLADDER/INSTILL  PYRIDIUM/MARCAINE;  Surgeon: Anner Crete, MD;  Location: [facility];  Service: Urology;  Laterality: N/A;  .  CYSTO WITH HYDRODISTENSION N/A 11/08/2015   Procedure: CYSTOSCOPY/HYDRODISTENSION;  Surgeon: Bjorn Pippin, MD;  Location: [facility];  Service: Urology;  Laterality: N/A;  . CYSTO WITH HYDRODISTENSION N/A 12/11/2016   Procedure: CYSTOSCOPY/HYDRODISTENSION;  Surgeon: Bjorn Pippin, MD;  Location: [facility];  Service: Urology;  Laterality: N/A;  . CYSTO/ HOD/ INSTILLATION  THERAPY  LAST ONE 08-27-2010   MULTIPLE TIMES  . CYSTOSCOPY WITH HYDRODISTENSION AND BIOPSY N/A 07/05/2019   Procedure: CYSTOSCOPY, HYDRODISTENSION/ INSTILATION MARCAINE AND PYRIDIUM;  Surgeon: Bjorn Pippin, MD;  Location[facility];  Service: Urology;  Laterality: N/A;  . INCISION AND DRAINAGE HIP Right 01/01/2018   Procedure: IRRIGATION AND DEBRIDEMENT RIGHT HIP INCISION;  Surgeon: Kathryne Hitch, MD;  Location: [facility];  Service: Orthopedics;  Laterality: Right;  . JOINT REPLACEMENT     bilateral hip c. blackman 11-06-17  . TONSILLECTOMY    . TOTAL KNEE ARTHROPLASTY Left 11/25/2019   Procedure: LEFT TOTAL KNEE ARTHROPLASTY;  Surgeon: Kathryne Hitch, MD;  Location: [facility];  Service: Orthopedics;  Laterality: Left;  . TOTAL SHOULDER ARTHROPLASTY Right 04/15/2018  . TOTAL SHOULDER ARTHROPLASTY Right 04/15/2018   Procedure: TOTAL SHOULDER ARTHROPLASTY;  Surgeon: Jones Broom, MD;  Location: [facility];  Service: Orthopedics;  Laterality: Right;  . TOTAL SHOULDER ARTHROPLASTY Left 08/05/2018   Procedure: LEFT TOTAL SHOULDER ARTHROPLASTY;  Surgeon: Jones Broom, MD;  Location: [facility];  Service: Orthopedics;  Laterality: Left;  . TRANSTHORACIC ECHOCARDIOGRAM  07-06-2012   mild LVH, ef 55-65%, grade 1 diastolic dysfunction/  mild AR/  trivial MR and TR  . VAGINAL HYSTERECTOMY  1980'S   complete     OB History   No obstetric history on file.     No family history on file.  Social History   Tobacco Use  . Smoking status: Current Every Day Smoker    Packs/day: 0.50    Years: 14.00    Pack years: 7.00    Types: Cigarettes  . Smokeless tobacco: Never Used  Vaping Use  . Vaping Use: Former  . Quit date: 11/17/2017  Substance Use Topics  . Alcohol use: No  . Drug use: No    Home Medications Prior to Admission medications   Medication Sig Start Date End Date Taking? Authorizing Provider  [facility] ASPIRIN ADULT LOW DOSE 81 MG chewable tablet CHEW 1 TABLET (81  MG TOTAL) BY MOUTH 2 (TWO) TIMES DAILY. 12/19/19   Kathryne Hitch, MD  cyclobenzaprine (FLEXERIL) 5 MG tablet Take 1 tablet (5 mg total) by mouth 3 (three) times daily as needed for muscle spasms. 02/24/20   Kathryne Hitch, MD  divalproex (DEPAKOTE) 500 MG DR tablet Take 500 mg by mouth at bedtime. 09/15/17   [provider]  escitalopram (LEXAPRO) 20 MG tablet Take 20 mg by mouth at bedtime.     [provider]  famotidine (PEPCID) 40 MG tablet Take 40 mg by mouth daily.    [provider]  gabapentin (NEURONTIN) 300 MG capsule Take 300 mg by mouth 2 (two) times daily. at bedtime. 10/08/17   [provider]  HYDROcodone-acetaminophen (NORCO/VICODIN) 5-325 MG tablet Take 1-2 tablets by mouth every 6 (six) hours as needed for moderate pain. 04/05/20   Persons, [place], PA  levothyroxine (SYNTHROID) 100 MCG tablet Take 100 mcg by mouth daily before breakfast.     [provider]  LORazepam (ATIVAN) 0.5 MG tablet Take 0.5 mg by mouth daily as needed for anxiety.  01/03/17  [provider]  methocarbamol (ROBAXIN) 500 MG tablet TAKE 1 TABLET (500 MG TOTAL) BY MOUTH EVERY 6 (SIX) HOURS AS NEEDED FOR MUSCLE SPASMS. 04/18/20   Kathryne Hitch, MD  omeprazole (PRILOSEC) 20 MG capsule Take 20 mg by mouth every morning.     [provider]  ondansetron (ZOFRAN ODT) 4 MG disintegrating tablet Take 1 tablet (4 mg total) by mouth every 8 (eight) hours as needed for nausea or vomiting. 11/26/19   Kathryne Hitch, MD  QUEtiapine (SEROQUEL) 200 MG tablet Take 200 mg by mouth at bedtime.    [provider]  rizatriptan (MAXALT) 10 MG tablet Take 10 mg by mouth as needed for migraine.  08/18/17   [provider]    Allergies    Aspirin, Sulfa antibiotics, and Trilafon [perphenazine]  Review of Systems   Review of Systems  All other systems reviewed and are negative.   Physical Exam Updated Vital Signs BP  (!) 220/106 (BP Location: Right Arm)   Pulse 95   Temp (!) 96.9 F (36.1 C) (Axillary)   Resp 16   Ht 4\' 10"  (1.473 m)   Wt 63.5 kg   SpO2 100%   BMI 29.26 kg/m   Physical Exam Vitals and nursing note reviewed.  Constitutional:      General: She is not in acute distress.    Appearance: She is well-developed.  HENT:     Head: Atraumatic.     Mouth/Throat:     Comments: Mouth: upper partial denture noted.  There is a no appreciable upper overbite as the teeth are not lining up appropriately.  Tenderness along the mandible more significant to the left mandible without overlying skin changes.  Decreased jaw movement. Eyes:     Conjunctiva/sclera: Conjunctivae normal.  Musculoskeletal:     Cervical back: Neck supple.  Skin:    Findings: No rash.  Neurological:     Mental Status: She is alert.     ED Results / Procedures / Treatments   Labs (all labs ordered are listed, but only abnormal results are displayed) Labs Reviewed - No data to display  EKG None  Radiology CT Maxillofacial Wo Contrast  Result Date: 05/04/2020 CLINICAL DATA:  64 year old female with dislocated jaw. EXAM: CT MAXILLOFACIAL WITHOUT CONTRAST TECHNIQUE: Multidetector CT imaging of the maxillofacial structures was performed. Multiplanar CT image reconstructions were also generated. COMPARISON:  Facial bone CT dated 11/06/2016. FINDINGS: Osseous: No acute fracture.  No mandibular subluxation. Orbits: Negative. No traumatic or inflammatory finding. Sinuses: Postsurgical changes of the right maxillary sinus. No fluid collection. The mastoid air cells are clear. Soft tissues: Negative. Limited intracranial: No significant or unexpected finding. IMPRESSION: No acute facial bone fractures. Electronically Signed   By: 11/08/2016 M.D.   On: 05/04/2020 19:33    Procedures Procedures (including critical care time)  Medications Ordered in ED Medications  HYDROmorphone (DILAUDID) injection 1 mg (1 mg  Intravenous Given 05/04/20 1952)  diphenhydrAMINE (BENADRYL) injection 50 mg (50 mg Intravenous Given 05/04/20 1952)  benztropine mesylate (COGENTIN) injection 1 mg (1 mg Intravenous Given 05/04/20 2050)  benztropine mesylate (COGENTIN) injection 1 mg (1 mg Intravenous Given 05/04/20 2132)  LORazepam (ATIVAN) injection 0.5 mg (0.5 mg Intravenous Given 05/04/20 2216)    ED Course  I have reviewed the triage vital signs and the nursing notes.  Pertinent labs & imaging results that were available during my care of the patient were reviewed by me and considered in my medical decision making (  see chart for details).    MDM Rules/Calculators/A&P                          BP (!) 169/97   Pulse 92   Temp (!) 96.9 F (36.1 C) (Axillary)   Resp 15   Ht 4\' 10"  (1.473 m)   Wt 63.5 kg   SpO2 95%   BMI 29.26 kg/m   Final Clinical Impression(s) / ED Diagnoses Final diagnoses:  Dystonic drug reaction    Rx / DC Orders ED Discharge Orders         Ordered    ibuprofen (ADVIL) 600 MG tablet  Every 6 hours PRN        05/04/20 2216    cyclobenzaprine (FLEXERIL) 10 MG tablet  2 times daily PRN        05/04/20 2216         6:54 PM Patient presents with lockjaw concerning for potential jaw dislocation vs. Dystonic reaction.  Examination revealed an overbite as well as decreased jaw movement.  Her mouth is seen in close position.  Patient is on multiple psychiatric medication which include Depakote, Lexapro, Neurontin, Seroquel, Maxalt which potentially can use to her dystonic reaction.  Care discussed with Dr. Freida BusmanAllen.  Will give Benadryl, obtain maxillofacial CT to assess for potential dislocation.  7:46 PM Maxillofacial CT scan without any acute facial fracture and no evidence of jaw dislocation or subluxation.  Therefore her symptom is likely dystonia secondary to suspect her medications.  Will give Benadryl as well as pain medication.  10:07 PM Patient have received a total of 50 mg of  Benadryl via IV.  She also received 2 separate doses of benztropine 1 mg each.  She report no significant improvement, additional medication include Ativan was given.  10:14 PM Appreciate consultation from maxillofacial specialist Dr. Kenney Housemanrab who recommend d/c pt home to f/u outpt in his office next week. He recommend muscle relaxant and non steroidal medications.  He also recommend pt to move jaw side to side to help loosen the muscle.  Warm compress recommended as well.  I discussed this with pt and will d/c.  Return precaution given.     Fayrene Helperran, Kelvyn Schunk, PA-C 05/04/20 2218    Lorre NickAllen, Anthony, MD 05/08/20 [phone]

## 2020-05-04 NOTE — ED Triage Notes (Signed)
Patient reports her jaw  "locked up" about 45 minutes ago. Patient reports pain is 10/10. Says this has happened before but doesn't remember why. Patient says she was yawning a lot today and she heard a lot of "popping"

## 2020-05-04 NOTE — ED Provider Notes (Signed)
Medical screening examination/treatment/procedure(s) were conducted as a shared visit with non-physician practitioner(s) and myself.  I personally evaluated the patient during the encounter.    65 year old female presents with inability to open her mouth.  Patient has a history of jaw dislocations.  On exam her mouth is closed at this time.  She is tender at the left condyle.  Possibly this could be from medication reaction such as dystonia.  We will treat for that as well as CT of her maxillofacial   Lorre Nick, MD 05/04/20 1914

## 2020-05-04 NOTE — Discharge Instructions (Addendum)
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  Fortunately no evidence of jaw dislocation or jaw fracture were noted.  We recommend for you to apply warm compress to your jaw several times daily to help loosen the muscle.  Try to move your jaw side to side as often as possible to help loosen it.  Take anti-inflammatory medication and muscle relaxant as needed.  Call and follow up with Dr. Jill Alexanders Drab early next week for further management.  Return if you have any concerns.

## 2020-05-22 ENCOUNTER — Other Ambulatory Visit: Payer: Self-pay | Admitting: Physician Assistant

## 2020-05-22 ENCOUNTER — Telehealth: Payer: Self-pay

## 2020-05-22 MED ORDER — HYDROCODONE-ACETAMINOPHEN 5-325 MG PO TABS: 1.0000 | ORAL_TABLET | Freq: Four times a day (QID) | ORAL | 0 refills | Status: DC | PRN

## 2020-05-22 NOTE — Telephone Encounter (Signed)
Please advise 

## 2020-05-22 NOTE — Telephone Encounter (Signed)
Patient called she needs oxycodone refilled call back:[phone]

## 2020-05-22 NOTE — Telephone Encounter (Signed)
Done

## 2020-05-26 ENCOUNTER — Other Ambulatory Visit: Payer: Self-pay | Admitting: Orthopaedic Surgery

## 2020-05-28 NOTE — Telephone Encounter (Signed)
Refill

## 2020-06-05 ENCOUNTER — Other Ambulatory Visit: Payer: Self-pay | Admitting: Orthopaedic Surgery

## 2020-06-05 NOTE — Telephone Encounter (Signed)
Please advise 

## 2020-06-15 DIAGNOSIS — U071 COVID-19: Secondary | ICD-10-CM

## 2020-06-15 HISTORY — DX: COVID-19: U07.1

## 2020-06-28 ENCOUNTER — Other Ambulatory Visit: Payer: Self-pay | Admitting: Orthopaedic Surgery

## 2020-07-09 ENCOUNTER — Other Ambulatory Visit: Payer: Self-pay | Admitting: Physician Assistant

## 2020-07-09 ENCOUNTER — Telehealth: Payer: Self-pay | Admitting: Orthopedic Surgery

## 2020-07-09 MED ORDER — HYDROCODONE-ACETAMINOPHEN 5-325 MG PO TABS: 1.0000 | ORAL_TABLET | Freq: Four times a day (QID) | ORAL | 0 refills | Status: DC | PRN

## 2020-07-09 NOTE — Telephone Encounter (Signed)
Kaitlyn [name] states that she needs a refill of hydrocodone called into the pharmacy.  She uses the [facility] in [place].  Her call back # is [phone].

## 2020-07-09 NOTE — Telephone Encounter (Signed)
Please advise 

## 2020-07-09 NOTE — Telephone Encounter (Signed)
Done

## 2020-07-20 ENCOUNTER — Other Ambulatory Visit: Payer: Self-pay | Admitting: Orthopaedic Surgery

## 2020-07-20 MED ORDER — HYDROCODONE-ACETAMINOPHEN 5-325 MG PO TABS: 1.0000 | ORAL_TABLET | Freq: Four times a day (QID) | ORAL | 0 refills | Status: DC | PRN

## 2020-07-20 NOTE — Telephone Encounter (Signed)
Pt called asking for a refill of her oxycodone rx and she would like to be notified when it's been called in.   [phone]

## 2020-07-20 NOTE — Telephone Encounter (Signed)
I called and lmom for pt that her medication was sent to her pharmacy

## 2020-11-14 ENCOUNTER — Telehealth: Payer: Self-pay | Admitting: Orthopaedic Surgery

## 2020-11-14 NOTE — Telephone Encounter (Signed)
Pt dentist called and states that the pt will have a procedure done in two weeks and was wondering if pt needed antibiotics. I told her no because its been over 3 months since surgery, so she wondering if you could fax some to Adele Dan (her dentist) stating she does not need it! Fax number is [phone]

## 2020-11-14 NOTE — Telephone Encounter (Signed)
Okay for no antibiotics from my standpoint.

## 2020-11-14 NOTE — Telephone Encounter (Signed)
Faxed to provided fax number

## 2020-11-14 NOTE — Telephone Encounter (Signed)
11/25/19 THA Ok with no antibiotics even for procedure?

## 2021-01-08 ENCOUNTER — Other Ambulatory Visit: Payer: Self-pay | Admitting: Urology

## 2021-01-14 ENCOUNTER — Other Ambulatory Visit: Payer: Self-pay

## 2021-01-14 ENCOUNTER — Encounter (HOSPITAL_BASED_OUTPATIENT_CLINIC_OR_DEPARTMENT_OTHER): Payer: Self-pay | Admitting: Urology

## 2021-01-14 NOTE — Progress Notes (Addendum)
Spoke w/ via phone for pre-op interview---pt Lab needs dos----   none            Lab results------chest xray 07-10-2020 epic Echo 07-06-2012 epic COVID test ------01-15-2021 1200 Arrive at -------530 am 56-2022 NPO after MN NO Solid Food.  Clear liquids from MN until---430 am then npo Med rec completed Medications to take morning of surgery -----depakote, pantaprazole, gabapentin, levothyroxine, lorazepam prn Diabetic medication -----n/a Patient instructed to bring photo id and insurance card day of surgery Patient aware to have Driver (ride ) / caregiver  Spouse ray will stay   for 24 hours after surgery  Patient Special Instructions -----no smoking 24 hours before surgery Pre-Op special Istructions -----none Patient verbalized understanding of instructions that were given at this phone interview. Patient denies shortness of breath, chest pain, fever, cough at this phone interview.

## 2021-01-15 ENCOUNTER — Other Ambulatory Visit (HOSPITAL_COMMUNITY)
Admission: RE | Admit: 2021-01-15 | Discharge: 2021-01-15 | Disposition: A | Discharge status: Home / Self Care | Payer: Medicare HMO | Source: Ambulatory Visit | Attending: Urology | Admitting: Urology

## 2021-01-15 DIAGNOSIS — Z20822 Contact with and (suspected) exposure to covid-19: Secondary | ICD-10-CM | POA: Diagnosis not present

## 2021-01-15 DIAGNOSIS — Z01812 Encounter for preprocedural laboratory examination: Secondary | ICD-10-CM | POA: Diagnosis present

## 2021-01-16 LAB — SARS CORONAVIRUS 2 (TAT 6-24 HRS): SARS Coronavirus 2: NEGATIVE

## 2021-01-17 NOTE — Anesthesia Preprocedure Evaluation (Addendum)
Anesthesia Evaluation  Patient identified by MRN, date of birth, ID band Patient awake    Reviewed: Allergy & Precautions, Patient's Chart, lab work & pertinent test results  History of Anesthesia Complications (+) PONV and history of anesthetic complications  Airway Mallampati: I  TM Distance: >3 FB Neck ROM: Full    Dental  (+) Upper Dentures, Dental Advisory Given   Pulmonary Current Smoker,    Pulmonary exam normal        Cardiovascular negative cardio ROS Normal cardiovascular exam     Neuro/Psych  Headaches, PSYCHIATRIC DISORDERS Anxiety Depression    GI/Hepatic Neg liver ROS, GERD  ,  Endo/Other  Hypothyroidism   Renal/GU K 4.5 Cr 1.30     Musculoskeletal  (+) Arthritis ,   Abdominal (+) - obese,   Peds  Hematology hgb 11.1 plt 330   Anesthesia Other Findings   Reproductive/Obstetrics                            Anesthesia Physical  Anesthesia Plan  ASA: II  Anesthesia Plan: General   Post-op Pain Management:    Induction: Intravenous  PONV Risk Score and Plan: 4 or greater and Ondansetron, Dexamethasone, Midazolam and Scopolamine patch - Pre-op  Airway Management Planned: LMA  Additional Equipment: None  Intra-op Plan:   Post-operative Plan: Extubation in OR  Informed Consent: I have reviewed the patients History and Physical, chart, labs and discussed the procedure including the risks, benefits and alternatives for the proposed anesthesia with the patient or authorized representative who has indicated his/her understanding and acceptance.     Dental advisory given  Plan Discussed with: Anesthesiologist  Anesthesia Plan Comments:        Anesthesia Quick Evaluation

## 2021-01-17 NOTE — H&P (Signed)
I have pain or burning with urination.  HPI: Kaitlyn [name] is a 65 year-old female established patient who is here for pain or burning while urinating.  She does have pain or burning when she urinates.   She does urinate more frequently than once every 4 hours in the daytime. She does not have to strain or bear down to start her urinary stream. She does not dribble at the end of urination. She does have an abnormal sensation when needing to urinate. She usually gets up at night to urinate 2 times.   06/2019: Kaitlyn [name] returns today in fu with the complaint of bladder pain. She has a long history of IC and last had HOD in 2018. She was supposed to have a cystoscopy with HOD earlier this year but that got cancelled because of a medical issue with her husband. She pain in the bladder with urgency and after voiding she has stranguria. She has had no hematuria. She has had no fever. She has some incontinence with coughing. She has some increased frequency and nocturia 3-4x. The symptoms are similar to her prior IC symptoms. She had Mx species on her last UA in June.   07/21/2019: Pt underwent hydrodistension with Dr Annabell Howells on 10/20. Marcaine and Pyridium were instilled into the bladder at conclusion of procedure. Bladder capacity noted to be 550 mL and 600 mL under subsequent fillings. Some mild trabeculations were noted as well as a few glomerular hemorrhages consistent with previously known interstitial cystitis diagnosis.   She returns today for follow-up. Painful and burning voiding have improved significantly. She also states her incontinence has improved, she is only voiding 2x/nightly presently. She still occasionally uses pyridium but not as frequently before her HOD. She denies any visible blood in the urine or trouble starting her stream. No post-operative f/c, n/v.   06/13/2020: Patient presents with one-week history of increased bladder pain and pressure. Also associated with painful and burning  urination. No associated gross hematuria. She has mixed incontinence which is stable. Nocturia continues at her baseline x3. No associated fevers or chills, lower back or flank pain/discomfort, nausea/vomiting. She denies interval treatment for UTI since last office visit. She is managing her symptoms currently with Pyridium which is only mild to moderately effective.   11/09/2020: When I saw her in September she had a positive urine culture for Klebsiella. She was treated with Macrobid but with failure of symptoms to improve Cipro was prescribed by another provider. She tells me today at that time symptoms did resolve with return to baseline symptomatology. Since then she has had intermittent flare ups of her typical presentation with increased bladder pain, painful and burning urination as well as increased nocturia and associated intermittent urge incontinence. Her flares have not been significant enough to seek treatment. However on Monday of this week she had a severe exacerbation. She started taking Cipro which she had left over from a previous prescription as 1/2 tablet daily. This has helped some with the burning but she still complains of bladder pain and urgency as well as continued nocturia and urge incontinence. Denies visible blood in the urine. She feels like she is emptying well enough without any new or worsening obstructive signs. Symptoms not associated with fevers or chills, nausea/vomiting.   01/04/21: Kaitlyn [name] returns today in f/u for bladder pain and frequency with some dysuria but no hematuria. She has nocturia x 2-3. She has some intermittency. She has no urgency. Her UA and culture and culture were negative in  February and she has not required an HOD in 2 years.     ALLERGIES: Sulfa Drugs - Skin Rash    MEDICATIONS: Levothyroxine Sodium 100 mcg tablet  Depakote  Iron  Lexapro 20 mg tablet  Pyridium 200 mg tablet 1 tablet PO TID PRN  Seroquel 200 mg tablet     GU PSH: Cystoscopy  Hydrodistention - 07/05/2019, 2018, 2017, 2015, 2015, 2014, 2013, 2011, 2010, 2009 Hysterectomy Unilat SO - 2009     NON-GU PSH: Anesth, Shoulder Replacement Hip Replacement, Bilateral Remove Tonsils - 2009     GU PMH: Interstitial Cystitis (w/o hematuria) - 11/09/2020, (Worsening), She has increased pain from her IC and feels she needs another dilation. She has actually gone a fairly long time for her. I will get her set up for the procedure but sent a culture to make sure she doesn't have infection. I reviewed the risks of the procedure in detail. , - 2018, Chronic interstitial cystitis without hematuria, - 2017, Chronic Interstitial Cystitis, - 2014 Painful micturition, Unspec - 11/09/2020, - 06/13/2020 (Worsening), She has recurrent symptoms from her IC and would like to proceed with cystoscopy and HOD. She is well aware of the risks. , - 06/27/2019, She has symptoms that could be from cystitis or recurrent IC. I will send a culture and start macrobid pending the culture. If the culture is negative, she will probably need anesthetic cocktails or a repeat HOD. , - 2020 Mixed incontinence - 06/13/2020, (Stable), She continues to have mild incontinence. , - 2018 Subacute and chronic vaginitis, She has vaginal itching with prior improvement with fluconazole. I have sent a script for that. She has tolerated it in the past while on her current antidepressants. - 2020 Microscopic hematuria (Stable, Chronic), minimal stable and chronic. - 2018 Other microscopic hematuria, Microscopic hematuria - 2017 Oth GU systems Signs/Symptoms, Bladder pain - 2017 Urge incontinence, Urge incontinence of urine - 2017 Urinary Retention, Unspec, Acute Urinary Retention - 2014      PMH Notes:  2006-11-06 15:32:12 - Note: Arthritis   NON-GU PMH: Encounter for general adult medical examination without abnormal findings, Encounter for preventive health examination - 2017 Anxiety, Anxiety - 2014 Personal history of  other diseases of the digestive system, History of esophageal reflux - 2014 Personal history of other endocrine, nutritional and metabolic disease, History of hypercholesterolemia - 2014, History of hypothyroidism, - 2014 Personal history of other mental and behavioral disorders, History of depression - 2014    FAMILY HISTORY: Breast Cancer - Runs In Family Family Health Status Number - Runs In Family Lung Cancer - Mother, Father nephrolithiasis - Brother   SOCIAL HISTORY: Marital Status: Married Current Smoking Status: Patient smokes. Smokes 1/2 pack per day.   Tobacco Use Assessment Completed: Used Tobacco in last 30 days?    REVIEW OF SYSTEMS:    GU Review Female:   Patient reports burning /pain with urination. Patient denies frequent urination, hard to postpone urination, get up at night to urinate, leakage of urine, stream starts and stops, trouble starting your stream, have to strain to urinate, and being pregnant.  Gastrointestinal (Upper):   Patient denies nausea, vomiting, and indigestion/ heartburn.  Gastrointestinal (Lower):   Patient denies diarrhea and constipation.  Constitutional:   Patient denies fever, weight loss, night sweats, and fatigue.  Skin:   Patient denies skin rash/ lesion and itching.  Eyes:   Patient denies blurred vision and double vision.  Ears/ Nose/ Throat:   Patient denies sore throat and  sinus problems.  Hematologic/Lymphatic:   Patient denies swollen glands and easy bruising.  Cardiovascular:   Patient denies leg swelling and chest pains.  Respiratory:   Patient denies cough and shortness of breath.  Endocrine:   Patient denies excessive thirst.  Musculoskeletal:   Patient denies back pain and joint pain.  Neurological:   Patient denies headaches and dizziness.  Psychologic:   Patient denies depression and anxiety.   VITAL SIGNS:      01/04/2021 02:54 PM  BP 168/100 mmHg  Pulse 78 /min  Temperature 97.5 F / 36.3 C   MULTI-SYSTEM PHYSICAL  EXAMINATION:    Constitutional: Well-nourished. No physical deformities. Normally developed. Good grooming.  Respiratory: Normal breath sounds. No labored breathing, no use of accessory muscles.   Cardiovascular: Regular rate and rhythm. No murmur, no gallop.      Complexity of Data:  Records Review:   Previous Patient Records  Urine Test Review:   Urinalysis   PROCEDURES:          Urinalysis Dipstick Dipstick Cont'd  Color: Yellow Bilirubin: Neg mg/dL  Appearance: Clear Ketones: Neg mg/dL  Specific Gravity: 6.237 Blood: Neg ery/uL  pH: 7.0 Protein: Trace mg/dL  Glucose: Neg mg/dL Urobilinogen: 1.0 mg/dL    Nitrites: Neg    Leukocyte Esterase: Neg leu/uL    ASSESSMENT:      ICD-10 Details  1 GU:   Painful micturition, Unspec - R30.9 Chronic, Worsening - She has recurrent IC symptoms and generally responds well to cystoscopy with HOD. I will get her scheduled and she is well aware of the risks and benefits.   2   Interstitial Cystitis (w/o hematuria) - N30.10 Chronic, Worsening   PLAN:           Schedule Return Visit/Planned Activity: Next Available Appointment - Schedule Surgery  Procedure: Unspecified Date - Cystoscopy Hydrodistention - 62831 Notes: Next available.           Document Letter(s):  Created for Patient: Clinical Summary         Notes:   CC: Dr. Burnell Blanks.

## 2021-01-18 ENCOUNTER — Encounter (HOSPITAL_BASED_OUTPATIENT_CLINIC_OR_DEPARTMENT_OTHER)
Admission: RE | Disposition: A | Discharge status: Home / Self Care | Payer: Self-pay | Source: Ambulatory Visit | Attending: Urology

## 2021-01-18 ENCOUNTER — Encounter (HOSPITAL_BASED_OUTPATIENT_CLINIC_OR_DEPARTMENT_OTHER): Payer: Self-pay | Admitting: Urology

## 2021-01-18 ENCOUNTER — Other Ambulatory Visit: Payer: Self-pay

## 2021-01-18 ENCOUNTER — Ambulatory Visit (HOSPITAL_BASED_OUTPATIENT_CLINIC_OR_DEPARTMENT_OTHER)
Admission: RE | Admit: 2021-01-18 | Discharge: 2021-01-18 | Disposition: A | Discharge status: Home / Self Care | Payer: Medicare HMO | Source: Ambulatory Visit | Attending: Urology | Admitting: Urology

## 2021-01-18 ENCOUNTER — Ambulatory Visit (HOSPITAL_BASED_OUTPATIENT_CLINIC_OR_DEPARTMENT_OTHER): Payer: Medicare HMO | Admitting: Anesthesiology

## 2021-01-18 DIAGNOSIS — Z79899 Other long term (current) drug therapy: Secondary | ICD-10-CM | POA: Diagnosis not present

## 2021-01-18 DIAGNOSIS — Z96619 Presence of unspecified artificial shoulder joint: Secondary | ICD-10-CM | POA: Diagnosis not present

## 2021-01-18 DIAGNOSIS — F17210 Nicotine dependence, cigarettes, uncomplicated: Secondary | ICD-10-CM | POA: Insufficient documentation

## 2021-01-18 DIAGNOSIS — Z882 Allergy status to sulfonamides status: Secondary | ICD-10-CM | POA: Insufficient documentation

## 2021-01-18 DIAGNOSIS — R309 Painful micturition, unspecified: Secondary | ICD-10-CM | POA: Diagnosis not present

## 2021-01-18 DIAGNOSIS — Z96643 Presence of artificial hip joint, bilateral: Secondary | ICD-10-CM | POA: Insufficient documentation

## 2021-01-18 DIAGNOSIS — Z79890 Hormone replacement therapy (postmenopausal): Secondary | ICD-10-CM | POA: Diagnosis not present

## 2021-01-18 DIAGNOSIS — N3010 Interstitial cystitis (chronic) without hematuria: Secondary | ICD-10-CM | POA: Insufficient documentation

## 2021-01-18 HISTORY — PX: CYSTO WITH HYDRODISTENSION: SHX5453

## 2021-01-18 HISTORY — DX: Anemia, unspecified: D64.9

## 2021-01-18 SURGERY — CYSTOSCOPY, WITH BLADDER HYDRODISTENSION
Anesthesia: General

## 2021-01-18 MED ORDER — MIDAZOLAM HCL 5 MG/5ML IJ SOLN
INTRAMUSCULAR | Status: DC | PRN
  Administered 2021-01-18: 1 mg via INTRAVENOUS

## 2021-01-18 MED ORDER — OXYCODONE HCL 5 MG PO TABS
5.0000 mg | ORAL_TABLET | ORAL | Status: DC | PRN
  Administered 2021-01-18: 5 mg via ORAL

## 2021-01-18 MED ORDER — OXYCODONE HCL 5 MG PO TABS
ORAL_TABLET | ORAL | Status: AC
  Filled 2021-01-18: qty 1

## 2021-01-18 MED ORDER — PROPOFOL 10 MG/ML IV BOLUS
INTRAVENOUS | Status: AC
  Filled 2021-01-18: qty 20

## 2021-01-18 MED ORDER — ONDANSETRON HCL 4 MG/2ML IJ SOLN
INTRAMUSCULAR | Status: DC | PRN
  Administered 2021-01-18: 4 mg via INTRAVENOUS

## 2021-01-18 MED ORDER — FENTANYL CITRATE (PF) 100 MCG/2ML IJ SOLN
INTRAMUSCULAR | Status: DC | PRN
  Administered 2021-01-18: 100 ug via INTRAVENOUS

## 2021-01-18 MED ORDER — LACTATED RINGERS IV SOLN: INTRAVENOUS | Status: DC

## 2021-01-18 MED ORDER — DEXAMETHASONE SODIUM PHOSPHATE 10 MG/ML IJ SOLN
INTRAMUSCULAR | Status: DC | PRN
  Administered 2021-01-18: 10 mg via INTRAVENOUS

## 2021-01-18 MED ORDER — SODIUM CHLORIDE 0.9% FLUSH: 3.0000 mL | Freq: Two times a day (BID) | INTRAVENOUS | Status: DC

## 2021-01-18 MED ORDER — ONDANSETRON HCL 4 MG/2ML IJ SOLN
INTRAMUSCULAR | Status: AC
  Filled 2021-01-18: qty 2

## 2021-01-18 MED ORDER — SCOPOLAMINE 1 MG/3DAYS TD PT72
MEDICATED_PATCH | TRANSDERMAL | Status: AC
  Filled 2021-01-18: qty 1

## 2021-01-18 MED ORDER — BUPIVACAINE HCL 0.5 % IJ SOLN: INTRAMUSCULAR | Status: DC | PRN

## 2021-01-18 MED ORDER — FENTANYL CITRATE (PF) 100 MCG/2ML IJ SOLN
INTRAMUSCULAR | Status: AC
  Filled 2021-01-18: qty 2

## 2021-01-18 MED ORDER — ACETAMINOPHEN 500 MG PO TABS
1000.0000 mg | ORAL_TABLET | Freq: Once | ORAL | Status: AC
  Administered 2021-01-18: 1000 mg via ORAL

## 2021-01-18 MED ORDER — ACETAMINOPHEN 500 MG PO TABS
ORAL_TABLET | ORAL | Status: AC
  Filled 2021-01-18: qty 2

## 2021-01-18 MED ORDER — DEXAMETHASONE SODIUM PHOSPHATE 10 MG/ML IJ SOLN
INTRAMUSCULAR | Status: AC
  Filled 2021-01-18: qty 1

## 2021-01-18 MED ORDER — FENTANYL CITRATE (PF) 100 MCG/2ML IJ SOLN
25.0000 ug | INTRAMUSCULAR | Status: DC | PRN
  Administered 2021-01-18 (×2): 25 ug via INTRAVENOUS

## 2021-01-18 MED ORDER — SODIUM CHLORIDE 0.9 % IV SOLN: 250.0000 mL | INTRAVENOUS | Status: DC | PRN

## 2021-01-18 MED ORDER — CEFAZOLIN SODIUM-DEXTROSE 2-4 GM/100ML-% IV SOLN
INTRAVENOUS | Status: AC
  Filled 2021-01-18: qty 100

## 2021-01-18 MED ORDER — SCOPOLAMINE 1 MG/3DAYS TD PT72
1.0000 | MEDICATED_PATCH | TRANSDERMAL | Status: DC
  Administered 2021-01-18: 1.5 mg via TRANSDERMAL

## 2021-01-18 MED ORDER — ACETAMINOPHEN 325 MG PO TABS: 650.0000 mg | ORAL_TABLET | ORAL | Status: DC | PRN

## 2021-01-18 MED ORDER — PROPOFOL 10 MG/ML IV BOLUS
INTRAVENOUS | Status: DC | PRN
  Administered 2021-01-18: 140 mg via INTRAVENOUS

## 2021-01-18 MED ORDER — LIDOCAINE 2% (20 MG/ML) 5 ML SYRINGE
INTRAMUSCULAR | Status: AC
  Filled 2021-01-18: qty 5

## 2021-01-18 MED ORDER — HYDROCODONE-ACETAMINOPHEN 5-325 MG PO TABS: 1.0000 | ORAL_TABLET | Freq: Four times a day (QID) | ORAL | 0 refills | Status: DC | PRN

## 2021-01-18 MED ORDER — LIDOCAINE 2% (20 MG/ML) 5 ML SYRINGE
INTRAMUSCULAR | Status: DC | PRN
  Administered 2021-01-18: 50 mg via INTRAVENOUS

## 2021-01-18 MED ORDER — SODIUM CHLORIDE 0.9% FLUSH: 3.0000 mL | INTRAVENOUS | Status: DC | PRN

## 2021-01-18 MED ORDER — STERILE WATER FOR IRRIGATION IR SOLN
Status: DC | PRN
  Administered 2021-01-18: 3000 mL

## 2021-01-18 MED ORDER — CEFAZOLIN SODIUM-DEXTROSE 2-4 GM/100ML-% IV SOLN
2.0000 g | INTRAVENOUS | Status: AC
  Administered 2021-01-18: 2 g via INTRAVENOUS

## 2021-01-18 MED ORDER — PHENAZOPYRIDINE HCL 200 MG PO TABS: 200.0000 mg | ORAL_TABLET | Freq: Three times a day (TID) | ORAL | 1 refills | Status: DC | PRN

## 2021-01-18 MED ORDER — MIDAZOLAM HCL 2 MG/2ML IJ SOLN
INTRAMUSCULAR | Status: AC
  Filled 2021-01-18: qty 2

## 2021-01-18 MED ORDER — PHENAZOPYRIDINE HCL 200 MG PO TABS: ORAL | Status: DC | PRN

## 2021-01-18 MED ORDER — ACETAMINOPHEN 325 MG RE SUPP: 650.0000 mg | RECTAL | Status: DC | PRN

## 2021-01-18 SURGICAL SUPPLY — 18 items
BAG DRAIN URO-CYSTO SKYTR STRL (DRAIN) ×2 IMPLANT
BAG DRN UROCATH (DRAIN) ×1
CATH ROBINSON RED A/P 16FR (CATHETERS) ×2 IMPLANT
CLOTH BEACON ORANGE TIMEOUT ST (SAFETY) ×2 IMPLANT
ELECT REM PT RETURN 9FT ADLT (ELECTROSURGICAL) ×2
ELECTRODE REM PT RTRN 9FT ADLT (ELECTROSURGICAL) ×1 IMPLANT
GLOVE SURG POLYISO LF SZ8 (GLOVE) ×2 IMPLANT
GOWN STRL REUS W/TWL XL LVL3 (GOWN DISPOSABLE) ×2 IMPLANT
KIT TURNOVER CYSTO (KITS) ×2 IMPLANT
MANIFOLD NEPTUNE II (INSTRUMENTS) ×2 IMPLANT
NDL SAFETY ECLIPSE 18X1.5 (NEEDLE) IMPLANT
NEEDLE HYPO 18GX1.5 SHARP (NEEDLE)
NS IRRIG 500ML POUR BTL (IV SOLUTION) IMPLANT
PACK CYSTO (CUSTOM PROCEDURE TRAY) ×2 IMPLANT
PLUG CATH AND CAP STER (CATHETERS) IMPLANT
SYR 30ML LL (SYRINGE) ×2 IMPLANT
TUBE CONNECTING 12X1/4 (SUCTIONS) IMPLANT
WATER STERILE IRR 3000ML UROMA (IV SOLUTION) ×2 IMPLANT

## 2021-01-18 NOTE — Transfer of Care (Signed)
Immediate Anesthesia Transfer of Care Note  Patient: Kaitlyn [name]  Procedure(s) Performed: CYSTOSCOPY/HYDRODISTENSION OF BLADDER INSTILL MARCAINE AND PYRIDIUM (N/A )  Patient Location: PACU  Anesthesia Type:General  Level of Consciousness: awake, alert , oriented and patient cooperative  Airway & Oxygen Therapy: Patient Spontanous Breathing and Patient connected to face mask oxygen  Post-op Assessment: Report given to RN and Post -op Vital signs reviewed and stable  Post vital signs: Reviewed and stable  Last Vitals:  Vitals Value Taken Time  BP 120/68 01/18/21 0745  Temp    Pulse 91 01/18/21 0745  Resp 9 01/18/21 0745  SpO2 92 % 01/18/21 0745  Vitals shown include unvalidated device data.  Last Pain:  Vitals:   01/18/21 0605  TempSrc: Oral  PainSc: 4       Patients Stated Pain Goal: 5 (01/18/21 1937)  Complications: No complications documented.

## 2021-01-18 NOTE — Interval H&P Note (Signed)
History and Physical Interval Note:  01/18/2021 7:07 AM  Kaitlyn [name]  has presented today for surgery, with the diagnosis of interstitial cystitis.  The various methods of treatment have been discussed with the patient and family. After consideration of risks, benefits and other options for treatment, the patient has consented to  Procedure(s): CYSTOSCOPY/HYDRODISTENSION OF BLADDER INSTILL MARCAINE AND PYRIDIUM (N/A) as a surgical intervention.  The patient's history has been reviewed, patient examined, no change in status, stable for surgery.  I have reviewed the patient's chart and labs.  Questions were answered to the patient's satisfaction.     Bjorn Pippin

## 2021-01-18 NOTE — Discharge Instructions (Signed)
CYSTOSCOPY HOME CARE INSTRUCTIONS  Activity: Rest for the remainder of the day.  Do not drive or operate equipment today.  You may resume normal activities in one to two days as instructed by your physician.   Meals: Drink plenty of liquids and eat light foods such as gelatin or soup this evening.  You may return to a normal meal plan tomorrow.  Return to Work: You may return to work in one to two days or as instructed by your physician.  **NO acetaminophen or Tylenol until after 12pm today if needed.  Special Instructions / Symptoms: Call your physician if any of these symptoms occur:   -persistent or heavy bleeding  -bleeding which continues after first few urination  -large blood clots that are difficult to pass  -urine stream diminishes or stops completely  -fever equal to or higher than 101 degrees Farenheit.  -cloudy urine with a strong, foul odor  -severe pain  Females should always wipe from front to back after elimination.  You may feel some burning pain when you urinate.  This should disappear with time.  Applying moist heat to the lower abdomen or a hot tub bath may help relieve the pain. \    Post Anesthesia Home Care Instructions  Activity: Get plenty of rest for the remainder of the day. A responsible individual must stay with you for 24 hours following the procedure.  For the next 24 hours, DO NOT: -Drive a car -Advertising copywriter -Drink alcoholic beverages -Take any medication unless instructed by your physician -Make any legal decisions or sign important papers.  Meals: Start with liquid foods such as gelatin or soup. Progress to regular foods as tolerated. Avoid greasy, spicy, heavy foods. If nausea and/or vomiting occur, drink only clear liquids until the nausea and/or vomiting subsides. Call your physician if vomiting continues.  Special Instructions/Symptoms: Your throat may feel dry or sore from the anesthesia or the breathing tube placed in your throat  during surgery. If this causes discomfort, gargle with warm salt water. The discomfort should disappear within 24 hours.  If you had a scopolamine patch placed behind your ear for the management of post- operative nausea and/or vomiting:  1. The medication in the patch is effective for 72 hours, after which it should be removed.  Wrap patch in a tissue and discard in the trash. Wash hands thoroughly with soap and water. 2. You may remove the patch earlier than 72 hours if you experience unpleasant side effects which may include dry mouth, dizziness or visual disturbances. 3. Avoid touching the patch. Wash your hands with soap and water after contact with the patch.

## 2021-01-18 NOTE — Anesthesia Postprocedure Evaluation (Signed)
Anesthesia Post Note  Patient: Kaitlyn [name]  Procedure(s) Performed: CYSTOSCOPY/HYDRODISTENSION OF BLADDER INSTILL MARCAINE AND PYRIDIUM (N/A )     Patient location during evaluation: PACU Anesthesia Type: General Level of consciousness: sedated Pain management: pain level controlled Vital Signs Assessment: post-procedure vital signs reviewed and stable Respiratory status: spontaneous breathing and respiratory function stable Cardiovascular status: stable Postop Assessment: no apparent nausea or vomiting Anesthetic complications: no   No complications documented.  Last Vitals:  Vitals:   01/18/21 0830 01/18/21 0845  BP: 130/66 124/76  Pulse: 88 85  Resp: 12 (!) 24  Temp: 36.8 C 36.8 C  SpO2: 98% 95%    Last Pain:  Vitals:   01/18/21 0815  TempSrc:   PainSc: 6                  Amanada Philbrick DANIEL

## 2021-01-18 NOTE — Op Note (Signed)
Procedure: Cystoscopy with hydrodistention of the bladder and instillation of Pyridium and Marcaine.  Preop diagnosis: Interstitial cystitis.  Postop diagnosis: Same.  Surgeon: Dr. Bjorn Pippin.  Anesthesia: General.  Specimen: None.  EBL: None.  Drain: None.  Complications none.  Indications: The patient is a 65 year old female with a long history of interstitial cystitis requiring intermittent hydrodistention of the bladder with the last being in 2020.  Procedure: She was given 2 g of Ancef.  She was taken operating room where general anesthetic was induced.  She was placed in lithotomy position and fitted with PAS hose.  Her perineum and genitalia were prepped with Betadine solution she was draped in usual sterile fashion.  Cystoscopy was performed using a 21 [place] scope and 30 degree lens.  Examination revealed a normal urethra.  The bladder wall was smooth and pale without tumors, stones or inflammation.  Ureteral orifices were unremarkable.  The bladder was dilated to capacity under 80 cm water pressure.  Her capacity was 575 mL.  Inspection of the bladder after dilation and drainage demonstrated scattered glomeruli with the increased density on the left lateral wall.  No tears or ulcers were identified.  The bladder was drained and a 16 French red rubber catheter was passed.  15 mL of 0.5% Marcaine and 400 mg of crushed Pyridium were instilled and the catheter was removed.  She was taken down from lithotomy position, her anesthetic was reversed and she was moved recovery in stable condition.  There were no complications.

## 2021-01-18 NOTE — Anesthesia Procedure Notes (Signed)
Procedure Name: LMA Insertion Date/Time: 01/18/2021 7:23 AM Performed by: Bishop Limbo, CRNA Pre-anesthesia Checklist: Patient identified, Emergency Drugs available, Suction available and Patient being monitored Patient Re-evaluated:Patient Re-evaluated prior to induction Oxygen Delivery Method: Circle System Utilized Preoxygenation: Pre-oxygenation with 100% oxygen Induction Type: IV induction Ventilation: Mask ventilation without difficulty LMA: LMA inserted LMA Size: 4.0 Number of attempts: 1 Placement Confirmation: positive ETCO2 Tube secured with: Tape Dental Injury: Teeth and Oropharynx as per pre-operative assessment

## 2021-01-21 ENCOUNTER — Encounter (HOSPITAL_BASED_OUTPATIENT_CLINIC_OR_DEPARTMENT_OTHER): Payer: Self-pay | Admitting: Urology

## 2021-05-03 ENCOUNTER — Other Ambulatory Visit: Payer: Self-pay | Admitting: Orthopaedic Surgery

## 2021-05-09 ENCOUNTER — Ambulatory Visit: Payer: Self-pay

## 2021-05-09 ENCOUNTER — Ambulatory Visit: Payer: Medicare HMO | Admitting: Physician Assistant

## 2021-05-09 ENCOUNTER — Ambulatory Visit (INDEPENDENT_AMBULATORY_CARE_PROVIDER_SITE_OTHER): Payer: Medicare HMO

## 2021-05-09 ENCOUNTER — Encounter: Payer: Self-pay | Admitting: Physician Assistant

## 2021-05-09 VITALS — Ht 60.5 in | Wt 134.4 lb

## 2021-05-09 DIAGNOSIS — M1711 Unilateral primary osteoarthritis, right knee: Secondary | ICD-10-CM | POA: Diagnosis not present

## 2021-05-09 DIAGNOSIS — M25562 Pain in left knee: Secondary | ICD-10-CM | POA: Diagnosis not present

## 2021-05-09 DIAGNOSIS — Z96652 Presence of left artificial knee joint: Secondary | ICD-10-CM | POA: Diagnosis not present

## 2021-05-09 NOTE — Progress Notes (Signed)
HPI: Kaitlyn [name] returns today due to right knee pain.  She wants to discuss possible knee surgery.  Since she was last seen she fell down some stairs on 05/04/2021.  Falling onto wooden floor.  She reports swelling in the right knee.  She feels as if the knee may give way on her at times.  Pain awakens her at night.  She has problems going up and down stairs due to the pain in the knee.  History of left total knee arthroplasty with sore but overall doing well.  She is taking NSAIDs with no real relief.  Right knee pain awakens her at night.  She has been seeing her gastroenterologist due to some blood in her stool she is unsure of what her hemoglobin level is but reports she still on iron.  She denies any chest pain shortness of breath fevers chills.  Physical exam: General well-developed well-nourished female no acute distress mood affect appropriate. Psych: Alert and oriented x3 Bilateral knees she has good range of motion of both knees.  Right knee with slight effusion no abnormal warmth erythema.  No gross instability valgus varus stressing of either knee.  Left knee with well-healed surgical incision.  Calves are supple nontender bilaterally. Right knee is prepped with Betadine and aspirations performed 20 cc of blood was obtained.  Radiographs: Right knee 2 views: Near bone-on-bone medial compartment.  Slight narrowing lateral compartment.  Moderate patellofemoral changes.  Knee is well located.  No bony abnormalities otherwise. Left knee AP lateral views: No acute fractures.  Knee is well located.  No evidence of hardware failure or loosening. Right knee 2 views: Near bone-on-bone medial compartment.  Slight narrowing lateral compartment.  Moderate patellofemoral changes.  Knee is well located.  No bony abnormalities otherwise.  Impression: Acute bilateral knee pain End-stage arthritis right knee  Plan: Discussed with her total knee surgery she is aware of the risk benefits of surgery she has had  left total knee in the past.  She also is aware of the postop protocol.  Risk include but are not limited to DVT/PE, blood loss, infection, nerve vessel injury and prolonged pain.  She will discuss with her gastroenterologist if she is cleared for surgery and once she has clearance will call Domenica Reamer to schedule surgery.  Domenica Reamer card was given.  Questions were encouraged and answered at length today.  We will see her back 1 week postop.

## 2021-08-03 ENCOUNTER — Other Ambulatory Visit: Payer: Self-pay | Admitting: Orthopaedic Surgery

## 2021-09-30 ENCOUNTER — Other Ambulatory Visit: Payer: Self-pay | Admitting: Urology

## 2021-10-01 ENCOUNTER — Other Ambulatory Visit: Payer: Self-pay

## 2021-10-01 ENCOUNTER — Encounter (HOSPITAL_COMMUNITY)
Admission: RE | Admit: 2021-10-01 | Discharge: 2021-10-01 | Disposition: A | Discharge status: Home / Self Care | Payer: Medicare HMO | Source: Ambulatory Visit | Attending: Urology | Admitting: Urology

## 2021-10-01 ENCOUNTER — Encounter (HOSPITAL_COMMUNITY): Payer: Self-pay

## 2021-10-01 NOTE — Progress Notes (Addendum)
COVID swab appointment:  N/A  COVID Vaccine Completed: Yes x2 Date COVID Vaccine completed: Has received booster: COVID vaccine manufacturer: [employer]   Date of COVID positive in last 90 days: No  PCP - Janace Litten, MD Cardiologist - N/A  Chest x-ray - N/A EKG - N/A Stress Test - N/A ECHO - greater than 2 years Epic Cardiac Cath - N/A Pacemaker/ICD device last checked: Spinal Cord Stimulator:  Sleep Study - N/A CPAP -   Fasting Blood Sugar - N/A Checks Blood Sugar _____ times a day  Blood Thinner Instructions:N/A Aspirin Instructions: Last Dose:  Activity level:   Can go up a flight of stairs and perform activities of daily living without stopping and without symptoms of chest pain or shortness of breath.    Anesthesia review: N/A  Patient denies shortness of breath, fever, cough and chest pain at PAT appointment  Patient verbalized understanding of instructions that were given to them at the PAT appointment. Patient was also instructed that they will need to review over the PAT instructions again at home before surgery.

## 2021-10-03 NOTE — Anesthesia Preprocedure Evaluation (Addendum)
Anesthesia Evaluation  Patient identified by MRN, date of birth, ID band Patient awake    Reviewed: Allergy & Precautions, NPO status , Patient's Chart, lab work & pertinent test results  History of Anesthesia Complications (+) PONV and history of anesthetic complications  Airway Mallampati: II  TM Distance: >3 FB Neck ROM: Full    Dental  (+) Edentulous Upper   Pulmonary Current Smoker and Patient abstained from smoking.,    Pulmonary exam normal breath sounds clear to auscultation       Cardiovascular Exercise Tolerance: Good negative cardio ROS Normal cardiovascular exam Rhythm:Regular Rate:Normal     Neuro/Psych  Headaches, PSYCHIATRIC DISORDERS Anxiety Depression    GI/Hepatic Neg liver ROS, GERD  ,  Endo/Other  Hypothyroidism   Renal/GU negative Renal ROS   Interstitial cystitis    Musculoskeletal  (+) Arthritis ,   Abdominal   Peds  Hematology negative hematology ROS (+)   Anesthesia Other Findings   Reproductive/Obstetrics negative OB ROS                           Anesthesia Physical Anesthesia Plan  ASA: 2  Anesthesia Plan: General   Post-op Pain Management:    Induction: Intravenous  PONV Risk Score and Plan: 3 and Midazolam, Treatment may vary due to age or medical condition, Ondansetron and Dexamethasone  Airway Management Planned: LMA  Additional Equipment:   Intra-op Plan:   Post-operative Plan: Extubation in OR  Informed Consent: I have reviewed the patients History and Physical, chart, labs and discussed the procedure including the risks, benefits and alternatives for the proposed anesthesia with the patient or authorized representative who has indicated his/her understanding and acceptance.     Dental advisory given  Plan Discussed with: Anesthesiologist and CRNA  Anesthesia Plan Comments:        Anesthesia Quick Evaluation

## 2021-10-03 NOTE — H&P (Signed)
I have pain or burning with urination.  HPI: Kaitlyn [name] is a 66 year-old female established patient who is here for pain or burning while urinating.  She does have pain or burning when she urinates.   She does urinate more frequently than once every 4 hours in the daytime. She does not have to strain or bear down to start her urinary stream. She does not dribble at the end of urination. She does have an abnormal sensation when needing to urinate. She usually gets up at night to urinate 2 times.   09/30/21: Kaitlyn [name] returns today with recurrent symptoms with burning, back pain and nocturia 2-3x. Her UA is clear. Her symptoms are similar to her prior IC flairs and generally require HOD and cysto. She has a history of MUI and has frequency with urgency and a little bit of incontinence. She has had no hematuria.   02/01/2021: 66 year old female who underwent hydro distension with instillation of Pyridium and Marcaine into the bladder. She reports the procedure went well and she felt very good for a short time. However, last night she developed dysuria and has progressed until this morning. She denies inability to empty her bladder. She denies fevers and chills. She denies any bothersome urgency. She also denies gross hematuria, flank pain.   06/2019: Kaitlyn [name] returns today in fu with the complaint of bladder pain. She has a long history of IC and last had HOD in 2018. She was supposed to have a cystoscopy with HOD earlier this year but that got cancelled because of a medical issue with her husband. She pain in the bladder with urgency and after voiding she has stranguria. She has had no hematuria. She has had no fever. She has some incontinence with coughing. She has some increased frequency and nocturia 3-4x. The symptoms are similar to her prior IC symptoms. She had Mx species on her last UA in June.   07/21/2019: Pt underwent hydrodistension with Dr Jeffie Pollock on 10/20. Marcaine and Pyridium were instilled into the  bladder at conclusion of procedure. Bladder capacity noted to be 550 mL and 600 mL under subsequent fillings. Some mild trabeculations were noted as well as a few glomerular hemorrhages consistent with previously known interstitial cystitis diagnosis.   She returns today for follow-up. Painful and burning voiding have improved significantly. She also states her incontinence has improved, she is only voiding 2x/nightly presently. She still occasionally uses pyridium but not as frequently before her HOD. She denies any visible blood in the urine or trouble starting her stream. No post-operative f/c, n/v.   06/13/2020: Patient presents with one-week history of increased bladder pain and pressure. Also associated with painful and burning urination. No associated gross hematuria. She has mixed incontinence which is stable. Nocturia continues at her baseline x3. No associated fevers or chills, lower back or flank pain/discomfort, nausea/vomiting. She denies interval treatment for UTI since last office visit. She is managing her symptoms currently with Pyridium which is only mild to moderately effective.   11/09/2020: When I saw her in September she had a positive urine culture for Klebsiella. She was treated with Macrobid but with failure of symptoms to improve Cipro was prescribed by another provider. She tells me today at that time symptoms did resolve with return to baseline symptomatology. Since then she has had intermittent flare ups of her typical presentation with increased bladder pain, painful and burning urination as well as increased nocturia and associated intermittent urge incontinence. Her flares have not been significant  enough to seek treatment. However on Monday of this week she had a severe exacerbation. She started taking Cipro which she had left over from a previous prescription as 1/2 tablet daily. This has helped some with the burning but she still complains of bladder pain and urgency as well  as continued nocturia and urge incontinence. Denies visible blood in the urine. She feels like she is emptying well enough without any new or worsening obstructive signs. Symptoms not associated with fevers or chills, nausea/vomiting.   01/04/21: Kaitlyn [name] returns today in f/u for bladder pain and frequency with some dysuria but no hematuria. She has nocturia x 2-3. She has some intermittency. She has no urgency. Her UA and culture and culture were negative in February and she has not required an HOD in 2 years.     ALLERGIES: Sulfa Drugs - Skin Rash    MEDICATIONS: Levothyroxine Sodium 100 mcg tablet  Depakote  Iron  Lexapro 20 mg tablet  Pyridium 200 mg tablet 1 tablet PO TID PRN  Seroquel 200 mg tablet     GU PSH: Cystoscopy Hydrodistention - 01/18/2021, 07/05/2019, 2018, 2017, 2015, 2015, 2014, 2013, 2011, 2010, 2009 Hysterectomy Unilat SO - 2009     NON-GU PSH: Anesth, Shoulder Replacement Hip Replacement, Bilateral Remove Tonsils - 2009     GU PMH: Painful micturition, Unspec - 02/01/2021, She has recurrent IC symptoms and generally responds well to cystoscopy with HOD. I will get her scheduled and she is well aware of the risks and benefits. , - 01/04/2021, - 11/09/2020, - 06/13/2020 (Worsening), She has recurrent symptoms from her IC and would like to proceed with cystoscopy and HOD. She is well aware of the risks. , - 06/27/2019, She has symptoms that could be from cystitis or recurrent IC. I will send a culture and start macrobid pending the culture. If the culture is negative, she will probably need anesthetic cocktails or a repeat HOD. , - 2020 Interstitial Cystitis (w/o hematuria) - 01/04/2021, - 11/09/2020 (Worsening), She has increased pain from her IC and feels she needs another dilation. She has actually gone a fairly long time for her. I will get her set up for the procedure but sent a culture to make sure she doesn't have infection. I reviewed the risks of the procedure in detail. ,  - 2018, Chronic interstitial cystitis without hematuria, - 2017, Chronic Interstitial Cystitis, - 2014 Mixed incontinence - 06/13/2020, (Stable), She continues to have mild incontinence. , - 2018 Subacute and chronic vaginitis, She has vaginal itching with prior improvement with fluconazole. I have sent a script for that. She has tolerated it in the past while on her current antidepressants. - 2020 Microscopic hematuria (Stable, Chronic), minimal stable and chronic. - 2018 Other microscopic hematuria, Microscopic hematuria - 2017 Oth GU systems Signs/Symptoms, Bladder pain - 2017 Urge incontinence, Urge incontinence of urine - 2017 Urinary Retention, Unspec, Acute Urinary Retention - 2014      PMH Notes:  2006-11-06 15:32:12 - Note: Arthritis   NON-GU PMH: Encounter for general adult medical examination without abnormal findings, Encounter for preventive health examination - 2017 Anxiety, Anxiety - 2014 Personal history of other diseases of the digestive system, History of esophageal reflux - 2014 Personal history of other endocrine, nutritional and metabolic disease, History of hypercholesterolemia - 2014, History of hypothyroidism, - 2014 Personal history of other mental and behavioral disorders, History of depression - 2014    FAMILY HISTORY: Breast Cancer - [place] Status Number -  Runs In Family Lung Cancer - Mother, Father nephrolithiasis - Brother   SOCIAL HISTORY: Marital Status: Married Current Smoking Status: Patient smokes. Smokes 1/2 pack per day.   Tobacco Use Assessment Completed: Used Tobacco in last 30 days?    REVIEW OF SYSTEMS:    GU Review Female:   Patient reports burning /pain with urination, get up at night to urinate, leakage of urine, and stream starts and stops. Patient denies frequent urination, hard to postpone urination, trouble starting your stream, have to strain to urinate, and being pregnant.  Gastrointestinal (Upper):   Patient  denies nausea, vomiting, and indigestion/ heartburn.  Gastrointestinal (Lower):   Patient denies diarrhea and constipation.  Constitutional:   Patient denies fever, night sweats, weight loss, and fatigue.  Skin:   Patient denies skin rash/ lesion and itching.  Eyes:   Patient denies blurred vision and double vision.  Ears/ Nose/ Throat:   Patient denies sore throat and sinus problems.  Hematologic/Lymphatic:   Patient denies swollen glands and easy bruising.  Cardiovascular:   Patient denies leg swelling and chest pains.  Respiratory:   Patient denies shortness of breath and cough.  Endocrine:   Patient denies excessive thirst.  Musculoskeletal:   Patient denies back pain and joint pain.  Neurological:   Patient denies headaches and dizziness.  Psychologic:   Patient denies depression and anxiety.   VITAL SIGNS: None   MULTI-SYSTEM PHYSICAL EXAMINATION:    Constitutional: Well-nourished. No physical deformities. Normally developed. Good grooming.  Respiratory: Normal breath sounds. No labored breathing, no use of accessory muscles.   Cardiovascular: Regular rate and rhythm. No murmur, no gallop. , normal extremity pulses, no swelling, no varicosities.      Complexity of Data:  Records Review:   Previous Patient Records  Urine Test Review:   Urinalysis   PROCEDURES:          Urinalysis Dipstick Dipstick Cont'd  Color: Yellow Bilirubin: Neg mg/dL  Appearance: Clear Ketones: Neg mg/dL  Specific Gravity: 1.025 Blood: Neg ery/uL  pH: 6.0 Protein: Trace mg/dL  Glucose: Neg mg/dL Urobilinogen: 1.0 mg/dL    Nitrites: Neg    Leukocyte Esterase: Neg leu/uL    ASSESSMENT:      ICD-10 Details  1 GU:   Painful micturition, Unspec - R30.9 Chronic, Worsening - She has recurrent IC symptoms with a clear UA and has responded well to cystoscopy and HOD and she would like to proceed with that procedure. She is aware of the risks. I will get that set up ASAP.   2   Interstitial Cystitis (w/o  hematuria) - N30.10 Chronic, Worsening  3   Mixed incontinence - N39.46 Minor - She has frequency and urgency but only mild leakage.    PLAN:           Schedule Return Visit/Planned Activity: ASAP - Schedule Surgery  Procedure: Unspecified Date - Cystoscopy Hydrodistention [id number] Notes: asap

## 2021-10-04 ENCOUNTER — Encounter (HOSPITAL_COMMUNITY): Payer: Self-pay | Admitting: Urology

## 2021-10-04 ENCOUNTER — Ambulatory Visit (HOSPITAL_COMMUNITY)
Admission: RE | Admit: 2021-10-04 | Discharge: 2021-10-04 | Disposition: A | Discharge status: Home / Self Care | Payer: Medicare HMO | Attending: Urology | Admitting: Urology

## 2021-10-04 ENCOUNTER — Encounter (HOSPITAL_COMMUNITY)
Admission: RE | Disposition: A | Discharge status: Home / Self Care | Payer: Self-pay | Source: Home / Self Care | Attending: Urology

## 2021-10-04 ENCOUNTER — Ambulatory Visit (HOSPITAL_COMMUNITY): Payer: Medicare HMO | Admitting: Certified Registered"

## 2021-10-04 DIAGNOSIS — M1990 Unspecified osteoarthritis, unspecified site: Secondary | ICD-10-CM | POA: Insufficient documentation

## 2021-10-04 DIAGNOSIS — E039 Hypothyroidism, unspecified: Secondary | ICD-10-CM | POA: Insufficient documentation

## 2021-10-04 DIAGNOSIS — F17200 Nicotine dependence, unspecified, uncomplicated: Secondary | ICD-10-CM | POA: Insufficient documentation

## 2021-10-04 DIAGNOSIS — N3010 Interstitial cystitis (chronic) without hematuria: Secondary | ICD-10-CM | POA: Diagnosis not present

## 2021-10-04 DIAGNOSIS — K219 Gastro-esophageal reflux disease without esophagitis: Secondary | ICD-10-CM | POA: Insufficient documentation

## 2021-10-04 DIAGNOSIS — F32A Depression, unspecified: Secondary | ICD-10-CM | POA: Diagnosis not present

## 2021-10-04 DIAGNOSIS — F419 Anxiety disorder, unspecified: Secondary | ICD-10-CM | POA: Insufficient documentation

## 2021-10-04 DIAGNOSIS — D649 Anemia, unspecified: Secondary | ICD-10-CM

## 2021-10-04 HISTORY — PX: CYSTOSCOPY WITH HYDRODISTENSION AND BIOPSY: SHX5127

## 2021-10-04 LAB — CBC
HCT: 44.3 % (ref 36.0–46.0)
Hemoglobin: 14.4 g/dL (ref 12.0–15.0)
MCH: 32.1 pg (ref 26.0–34.0)
MCHC: 32.5 g/dL (ref 30.0–36.0)
MCV: 98.9 fL (ref 80.0–100.0)
Platelets: 226 10*3/uL (ref 150–400)
RBC: 4.48 MIL/uL (ref 3.87–5.11)
RDW: 12.6 % (ref 11.5–15.5)
WBC: 6.8 10*3/uL (ref 4.0–10.5)
nRBC: 0 % (ref 0.0–0.2)

## 2021-10-04 SURGERY — CYSTOSCOPY, WITH BLADDER HYDRODISTENSION AND BIOPSY
Anesthesia: General | Site: Bladder

## 2021-10-04 MED ORDER — PROPOFOL 10 MG/ML IV BOLUS
INTRAVENOUS | Status: AC
  Filled 2021-10-04: qty 20

## 2021-10-04 MED ORDER — AMISULPRIDE (ANTIEMETIC) 5 MG/2ML IV SOLN: 10.0000 mg | Freq: Once | INTRAVENOUS | Status: DC | PRN

## 2021-10-04 MED ORDER — ONDANSETRON HCL 4 MG/2ML IJ SOLN
INTRAMUSCULAR | Status: AC
  Filled 2021-10-04: qty 2

## 2021-10-04 MED ORDER — OXYCODONE HCL 5 MG PO TABS: 5.0000 mg | ORAL_TABLET | ORAL | Status: DC | PRN

## 2021-10-04 MED ORDER — SODIUM CHLORIDE 0.9 % IV SOLN: 250.0000 mL | INTRAVENOUS | Status: DC | PRN

## 2021-10-04 MED ORDER — OXYCODONE HCL 5 MG/5ML PO SOLN: 5.0000 mg | Freq: Once | ORAL | Status: AC | PRN

## 2021-10-04 MED ORDER — DEXAMETHASONE SODIUM PHOSPHATE 10 MG/ML IJ SOLN
INTRAMUSCULAR | Status: AC
  Filled 2021-10-04: qty 1

## 2021-10-04 MED ORDER — SODIUM CHLORIDE 0.9% FLUSH: 3.0000 mL | Freq: Two times a day (BID) | INTRAVENOUS | Status: DC

## 2021-10-04 MED ORDER — OXYCODONE HCL 5 MG PO TABS
ORAL_TABLET | ORAL | Status: AC
  Filled 2021-10-04: qty 1

## 2021-10-04 MED ORDER — CHLORHEXIDINE GLUCONATE 0.12 % MT SOLN
15.0000 mL | Freq: Once | OROMUCOSAL | Status: AC
  Administered 2021-10-04: 15 mL via OROMUCOSAL

## 2021-10-04 MED ORDER — ONDANSETRON HCL 4 MG/2ML IJ SOLN: 4.0000 mg | Freq: Once | INTRAMUSCULAR | Status: DC | PRN

## 2021-10-04 MED ORDER — FENTANYL CITRATE (PF) 100 MCG/2ML IJ SOLN
INTRAMUSCULAR | Status: DC | PRN
  Administered 2021-10-04 (×2): 25 ug via INTRAVENOUS

## 2021-10-04 MED ORDER — STERILE WATER FOR IRRIGATION IR SOLN
Status: DC | PRN
  Administered 2021-10-04: 6000 mL via INTRAVESICAL

## 2021-10-04 MED ORDER — PROPOFOL 10 MG/ML IV BOLUS
INTRAVENOUS | Status: DC | PRN
Start: 2021-10-04 — End: 2021-10-04
  Administered 2021-10-04: 120 mg via INTRAVENOUS

## 2021-10-04 MED ORDER — OXYCODONE HCL 5 MG PO TABS
5.0000 mg | ORAL_TABLET | Freq: Once | ORAL | Status: AC | PRN
  Administered 2021-10-04: 5 mg via ORAL

## 2021-10-04 MED ORDER — LIDOCAINE 2% (20 MG/ML) 5 ML SYRINGE
INTRAMUSCULAR | Status: DC | PRN
  Administered 2021-10-04: 40 mg via INTRAVENOUS

## 2021-10-04 MED ORDER — ONDANSETRON HCL 4 MG/2ML IJ SOLN
INTRAMUSCULAR | Status: DC | PRN
Start: 2021-10-04 — End: 2021-10-04
  Administered 2021-10-04: 4 mg via INTRAVENOUS

## 2021-10-04 MED ORDER — DEXAMETHASONE SODIUM PHOSPHATE 10 MG/ML IJ SOLN
INTRAMUSCULAR | Status: DC | PRN
  Administered 2021-10-04: 4 mg via INTRAVENOUS

## 2021-10-04 MED ORDER — EPHEDRINE SULFATE-NACL 50-0.9 MG/10ML-% IV SOSY
PREFILLED_SYRINGE | INTRAVENOUS | Status: DC | PRN
  Administered 2021-10-04: 5 mg via INTRAVENOUS

## 2021-10-04 MED ORDER — PHENAZOPYRIDINE HCL 200 MG PO TABS: 200.0000 mg | ORAL_TABLET | Freq: Three times a day (TID) | ORAL | 11 refills | Status: DC | PRN

## 2021-10-04 MED ORDER — MIDAZOLAM HCL 2 MG/2ML IJ SOLN
INTRAMUSCULAR | Status: AC
  Filled 2021-10-04: qty 2

## 2021-10-04 MED ORDER — ACETAMINOPHEN 500 MG PO TABS
1000.0000 mg | ORAL_TABLET | Freq: Once | ORAL | Status: AC
  Administered 2021-10-04: 1000 mg via ORAL
  Filled 2021-10-04: qty 2

## 2021-10-04 MED ORDER — CEFAZOLIN SODIUM-DEXTROSE 2-4 GM/100ML-% IV SOLN
2.0000 g | INTRAVENOUS | Status: AC
  Administered 2021-10-04: 2 g via INTRAVENOUS
  Filled 2021-10-04: qty 100

## 2021-10-04 MED ORDER — BUPIVACAINE HCL (PF) 0.5 % IJ SOLN
Freq: Once | INTRAMUSCULAR | Status: DC
  Filled 2021-10-04: qty 15

## 2021-10-04 MED ORDER — MIDAZOLAM HCL 2 MG/2ML IJ SOLN
INTRAMUSCULAR | Status: DC | PRN
  Administered 2021-10-04 (×2): 1 mg via INTRAVENOUS

## 2021-10-04 MED ORDER — HYDROCODONE-ACETAMINOPHEN 5-325 MG PO TABS: 1.0000 | ORAL_TABLET | Freq: Four times a day (QID) | ORAL | 0 refills | Status: DC | PRN

## 2021-10-04 MED ORDER — FENTANYL CITRATE PF 50 MCG/ML IJ SOSY
25.0000 ug | PREFILLED_SYRINGE | INTRAMUSCULAR | Status: DC | PRN
  Administered 2021-10-04: 50 ug via INTRAVENOUS

## 2021-10-04 MED ORDER — ACETAMINOPHEN 325 MG PO TABS: 650.0000 mg | ORAL_TABLET | ORAL | Status: DC | PRN

## 2021-10-04 MED ORDER — FENTANYL CITRATE (PF) 100 MCG/2ML IJ SOLN
INTRAMUSCULAR | Status: AC
  Filled 2021-10-04: qty 2

## 2021-10-04 MED ORDER — SODIUM CHLORIDE 0.9% FLUSH: 3.0000 mL | INTRAVENOUS | Status: DC | PRN

## 2021-10-04 MED ORDER — LACTATED RINGERS IV SOLN: INTRAVENOUS | Status: DC

## 2021-10-04 MED ORDER — ORAL CARE MOUTH RINSE: 15.0000 mL | Freq: Once | OROMUCOSAL | Status: AC

## 2021-10-04 MED ORDER — FENTANYL CITRATE PF 50 MCG/ML IJ SOSY
PREFILLED_SYRINGE | INTRAMUSCULAR | Status: AC
  Filled 2021-10-04: qty 1

## 2021-10-04 MED ORDER — ROCURONIUM BROMIDE 10 MG/ML (PF) SYRINGE
PREFILLED_SYRINGE | INTRAVENOUS | Status: AC
  Filled 2021-10-04: qty 10

## 2021-10-04 MED ORDER — ACETAMINOPHEN 650 MG RE SUPP
650.0000 mg | RECTAL | Status: DC | PRN
  Filled 2021-10-04: qty 1

## 2021-10-04 SURGICAL SUPPLY — 24 items
BAG COUNTER SPONGE SURGICOUNT (BAG) ×2 IMPLANT
BAG URINE DRAIN 2000ML AR STRL (UROLOGICAL SUPPLIES) IMPLANT
BAG URO CATCHER STRL LF (MISCELLANEOUS) ×2 IMPLANT
CATH FOLEY 2WAY SLVR  5CC 16FR (CATHETERS) ×2
CATH FOLEY 2WAY SLVR 5CC 16FR (CATHETERS) IMPLANT
CATH FOLEY 3WAY 30CC 22FR (CATHETERS) IMPLANT
CATH URETL OPEN 5X70 (CATHETERS) IMPLANT
DRAPE FOOT SWITCH (DRAPES) ×2 IMPLANT
ELECT REM PT RETURN 15FT ADLT (MISCELLANEOUS) ×2 IMPLANT
GLOVE SURG POLYISO LF SZ8 (GLOVE) IMPLANT
GOWN STRL REUS W/TWL XL LVL3 (GOWN DISPOSABLE) ×2 IMPLANT
HOLDER FOLEY CATH W/STRAP (MISCELLANEOUS) IMPLANT
KIT TURNOVER KIT A (KITS) ×2 IMPLANT
LOOP CUT BIPOLAR 24F LRG (ELECTROSURGICAL) IMPLANT
MANIFOLD NEPTUNE II (INSTRUMENTS) ×2 IMPLANT
PACK CYSTO (CUSTOM PROCEDURE TRAY) ×2 IMPLANT
PLUG CATH AND CAP STER (CATHETERS) ×1 IMPLANT
SET IRRIG Y TYPE TUR BLADDER L (SET/KITS/TRAYS/PACK) ×2 IMPLANT
SUT ETHILON 3 0 PS 1 (SUTURE) IMPLANT
SYR 30ML LL (SYRINGE) IMPLANT
SYR TOOMEY IRRIG 70ML (MISCELLANEOUS)
SYRINGE TOOMEY IRRIG 70ML (MISCELLANEOUS) IMPLANT
TUBING CONNECTING 10 (TUBING) ×2 IMPLANT
TUBING UROLOGY SET (TUBING) ×2 IMPLANT

## 2021-10-04 NOTE — Discharge Instructions (Signed)

## 2021-10-04 NOTE — Transfer of Care (Signed)
Immediate Anesthesia Transfer of Care Note  Patient: Kaitlyn [name]  Procedure(s) Performed: CYSTOSCOPY/BIOPSY/HYDRODISTENSION INSTILL MARCAINE AND PYRIDIUM (Bladder)  Patient Location: PACU  Anesthesia Type:General  Level of Consciousness: awake, alert  and patient cooperative  Airway & Oxygen Therapy: Patient Spontanous Breathing and Patient connected to face mask oxygen  Post-op Assessment: Report given to RN and Post -op Vital signs reviewed and stable  Post vital signs: Reviewed and stable  Last Vitals:  Vitals Value Taken Time  BP 160/98 10/04/21 0811  Temp    Pulse 89 10/04/21 0813  Resp 12 10/04/21 0813  SpO2 100 % 10/04/21 0813  Vitals shown include unvalidated device data.  Last Pain:  Vitals:   10/04/21 0609  TempSrc:   PainSc: 6       Patients Stated Pain Goal: 4 (10/04/21 9470)  Complications: No notable events documented.

## 2021-10-04 NOTE — Anesthesia Postprocedure Evaluation (Signed)
Anesthesia Post Note  Patient: TARISHA HERBST  Procedure(s) Performed: CYSTOSCOPY/BIOPSY/HYDRODISTENSION INSTILL MARCAINE AND PYRIDIUM (Bladder)     Patient location during evaluation: PACU Anesthesia Type: General Level of consciousness: awake Pain management: pain level controlled Vital Signs Assessment: post-procedure vital signs reviewed and stable Respiratory status: spontaneous breathing and respiratory function stable Cardiovascular status: stable Postop Assessment: no apparent nausea or vomiting Anesthetic complications: no   No notable events documented.  Last Vitals:  Vitals:   10/04/21 0811 10/04/21 0815  BP: (!) 160/98 (!) 150/85  Pulse: 87 89  Resp: 12 11  Temp: 36.6 C   SpO2: 100% 100%    Last Pain:  Vitals:   10/04/21 0811  TempSrc:   PainSc: Asleep                 Merlinda Frederick

## 2021-10-04 NOTE — Anesthesia Procedure Notes (Signed)
Procedure Name: LMA Insertion Date/Time: 10/04/2021 7:30 AM Performed by: Sindy Guadeloupe, CRNA Pre-anesthesia Checklist: Patient identified, Emergency Drugs available, Suction available, Patient being monitored and Timeout performed Patient Re-evaluated:Patient Re-evaluated prior to induction Oxygen Delivery Method: Circle system utilized Preoxygenation: Pre-oxygenation with 100% oxygen Induction Type: IV induction Ventilation: Mask ventilation without difficulty LMA: LMA inserted LMA Size: 4.0 Number of attempts: 1 Tube secured with: Tape Dental Injury: Teeth and Oropharynx as per pre-operative assessment

## 2021-10-04 NOTE — Interval H&P Note (Signed)
History and Physical Interval Note:  10/04/2021 7:18 AM  Kaitlyn [name]  has presented today for surgery, with the diagnosis of INTERSTITIAL CYSTITIS.  The various methods of treatment have been discussed with the patient and family. After consideration of risks, benefits and other options for treatment, the patient has consented to  Procedure(s): CYSTOSCOPY/BIOPSY/HYDRODISTENSION INSTILL MARCAINE AND PYRIDIUM (N/A) as a surgical intervention.  The patient's history has been reviewed, patient examined, no change in status, stable for surgery.  I have reviewed the patient's chart and labs.  Questions were answered to the patient's satisfaction.     Bjorn Pippin

## 2021-10-04 NOTE — Op Note (Signed)
Procedure: Cystoscopy with hydrodistention of the bladder and instillation of Pyridium and Marcaine.  Preop diagnosis: Interstitial cystitis.  Postop diagnosis: Same.  Surgeon: Dr. Bjorn Pippin.  Anesthesia: General.  Specimen: None.  Drain: 16 [place] Foley catheter.  EBL: None.  Complications: None.  Indication: The patient is a 66 year old female with a long history of interstitial cystitis she requires intermittent hydrodistention of bladder for pain control.  Procedure: She was taken operating room where she was given Ancef.  A general anesthetic was induced.  She was placed in lithotomy position and fitted with PAS hose.  Her perineum and genitalia were prepped with Betadine solution she was draped in usual sterile fashion.  Cystoscopy was performed using the 21 [place] scope and 30 degree lens.  Examination revealed a normal urethra.  The bladder wall was smooth and pale without tumors, stones or inflammation.  There was some squamous metaplasia trigone.  Ureteral orifices were unremarkable.  The bladder was then filled under 80 cm water pressure to capacity and then drained.  Her capacity under anesthesia was 700 mL.  After drainage there were scattered glomerulations consistent with her diagnosis of interstitial cystitis.  No ulcers or bladder wall cracks were noted.  A 16 French Foley catheter was inserted and the balloon was filled with 10 mL of sterile fluid.  15 mL of 0.5% Marcaine with 400 mg of crushed Pyridium was instilled.  The catheter was plugged.  She was taken down from lithotomy position, her anesthetic was reversed and she was moved recovery in stable condition where the catheter was placed to leg bag drainage.  There were no complications.

## 2021-10-05 ENCOUNTER — Encounter (HOSPITAL_COMMUNITY): Payer: Self-pay | Admitting: Urology

## 2021-10-23 ENCOUNTER — Telehealth: Payer: Self-pay | Admitting: Orthopaedic Surgery

## 2021-10-23 NOTE — Telephone Encounter (Signed)
Patient called. Says she has not heard anything from anyone about her surgery. Says she thought she was going to have surgery last October. She would like a call. [phone]

## 2021-10-24 NOTE — Telephone Encounter (Signed)
I called patient and left a voice mail for a return call.  This was at least the 6th time I have called patient in attempt to discuss scheduling and left a voice mail.

## 2021-10-28 NOTE — Telephone Encounter (Signed)
I texted patient Friday with message to call me back to schedule.  She called and we scheduled surgery.

## 2021-11-01 ENCOUNTER — Other Ambulatory Visit: Payer: Self-pay

## 2021-12-05 ENCOUNTER — Other Ambulatory Visit: Payer: Self-pay | Admitting: Physician Assistant

## 2021-12-05 DIAGNOSIS — M1711 Unilateral primary osteoarthritis, right knee: Secondary | ICD-10-CM

## 2021-12-13 NOTE — Progress Notes (Addendum)
COVID Vaccine Completed: yes x2 ?Date COVID Vaccine completed: ?Has received booster: ?COVID vaccine manufacturer: [employer]  ? ?Date of COVID positive in last 90 days: no ? ?PCP - Keturah Barre, MD ?Cardiologist - n/a ? ?Chest x-ray - n/a ?EKG - 12/16/21 Epic/chart ?Stress Test - n/a ?ECHO - 2013 ?Cardiac Cath - n/a ?Pacemaker/ICD device last checked: n/a ?Spinal Cord Stimulator: n/a ? ?Bowel Prep - no ? ?Sleep Study - n/a ?CPAP -  ? ?Fasting Blood Sugar - n/a ?Checks Blood Sugar _____ times a day ? ?Blood Thinner Instructions: n/a ?Aspirin Instructions: ?Last Dose: ? ?Activity level: Can go up a flight of stairs and perform activities of daily living without stopping and without symptoms of chest pain or shortness of breath. ?   ?Anesthesia review:  ? ?Patient denies shortness of breath, fever, cough and chest pain at PAT appointment ? ? ?Patient verbalized understanding of instructions that were given to them at the PAT appointment. Patient was also instructed that they will need to review over the PAT instructions again at home before surgery.  ?

## 2021-12-13 NOTE — Patient Instructions (Addendum)
DUE TO COVID-19 ONLY ONE VISITOR  (aged 66 and older)  IS ALLOWED TO COME WITH YOU AND STAY IN THE WAITING ROOM ONLY DURING PRE OP AND PROCEDURE.   ?**NO VISITORS ARE ALLOWED IN THE SHORT STAY AREA OR RECOVERY ROOM!!** ? ?IF YOU WILL BE ADMITTED INTO THE HOSPITAL YOU ARE ALLOWED ONLY TWO SUPPORT PEOPLE DURING VISITATION HOURS ONLY (7 AM -8PM)   ?The support person(s) must pass our screening, gel in and out, and wear a mask at all times, including in the patient?s room. ?Patients must also wear a mask when staff or their support person are in the room. ?Visitors GUEST BADGE MUST BE WORN VISIBLY  ?One adult visitor may remain with you overnight and MUST be in the room by 8 P.M. ?  ? ? Your procedure is scheduled on: 12/27/21 ? ? Report to [facility] Main Entrance ? ?  Report to admitting at 5:45 AM ? ? Call this number if you have problems the morning of surgery [phone] ? ? Do not eat food :After Midnight. ? ? After Midnight you may have the following liquids until 6:30 AM DAY OF SURGERY ? ?Water ?Black Coffee (sugar ok, NO MILK/CREAM OR CREAMERS)  ?Tea (sugar ok, NO MILK/CREAM OR CREAMERS) regular and decaf                             ?Plain Jell-O (NO RED)                                           ?Fruit ices (not with fruit pulp, NO RED)                                     ?Popsicles (NO RED)                                                                  ?Juice: apple, WHITE grape, WHITE cranberry ?Sports drinks like Gatorade (NO RED) ?Clear broth(vegetable,chicken,beef) ? ?              ?The day of surgery:  ?Drink ONE (1) Pre-Surgery Clear Ensure at 5:30 AM the morning of surgery. Drink in one sitting. Do not sip.  ?This drink was given to you during your hospital  ?pre-op appointment visit. ?Nothing else to drink after completing the  ?Pre-Surgery Clear Ensure. ?  ?       If you have questions, please contact your surgeon?s office. ? ? ?FOLLOW BOWEL PREP AND ANY ADDITIONAL PRE OP INSTRUCTIONS YOU  RECEIVED FROM YOUR SURGEON'S OFFICE!!! ?  ?  ?Oral Hygiene is also important to reduce your risk of infection.                                    ?Remember - BRUSH YOUR TEETH THE MORNING OF SURGERY WITH YOUR REGULAR TOOTHPASTE ? ? Do NOT smoke after Midnight ? ? Take these medicines the morning of surgery  with A SIP OF WATER: Amlodipine, Depakote, Gabapentin, Levothyroxine, Ativan, Zofran.  ?                  ?           You may not have any metal on your body including hair pins, jewelry, and body piercing ? ?           Do not wear make-up, lotions, powders, perfumes, or deodorant ? ?Do not wear nail polish including gel and S&S, artificial/acrylic nails, or any other type of covering on natural nails including finger and toenails. If you have artificial nails, gel coating, etc. that needs to be removed by a nail salon please have this removed prior to surgery or surgery may need to be canceled/ delayed if the surgeon/ anesthesia feels like they are unable to be safely monitored.  ? ?Do not shave  48 hours prior to surgery.  ? ? Do not bring valuables to the hospital. [facility] IS NOT ?            RESPONSIBLE   FOR VALUABLES. ? ? Contacts, dentures or bridgework may not be worn into surgery. ? ? Bring small overnight bag day of surgery. ?  ? Special Instructions: Bring a copy of your healthcare power of attorney and living will documents         the day of surgery if you haven't scanned them before. ? ?            Please read over the following fact sheets you were given: IF YOU HAVE QUESTIONS ABOUT YOUR PRE-OP INSTRUCTIONS PLEASE CALL [phone]- Fleet ContrasRachel  ? ?   [facility] - Preparing for Surgery ?Before surgery, you can play an important role.  Because skin is not sterile, your skin needs to be as free of germs as possible.  You can reduce the number of germs on your skin by washing with CHG (chlorahexidine gluconate) soap before surgery.  CHG is an antiseptic cleaner which kills germs and bonds with the skin  to continue killing germs even after washing. ?Please DO NOT use if you have an allergy to CHG or antibacterial soaps.  If your skin becomes reddened/irritated stop using the CHG and inform your nurse when you arrive at Short Stay. ?Do not shave (including legs and underarms) for at least 48 hours prior to the first CHG shower.  You may shave your face/neck. ? ?Please follow these instructions carefully: ? 1.  Shower with CHG Soap the night before surgery and the  morning of surgery. ? 2.  If you choose to wash your hair, wash your hair first as usual with your normal  shampoo. ? 3.  After you shampoo, rinse your hair and body thoroughly to remove the shampoo.                            ? 4.  Use CHG as you would any other liquid soap.  You can apply chg directly to the skin and wash.  Gently with a scrungie or clean washcloth. ? 5.  Apply the CHG Soap to your body ONLY FROM THE NECK DOWN.   Do   not use on face/ open      ?                     Wound or open sores. Avoid contact with eyes, ears mouth and  genitals (private parts).  ?                     Engineering geologist,  Genitals (private parts) with your normal soap. ?            6.  Wash thoroughly, paying special attention to the area where your    surgery  will be performed. ? 7.  Thoroughly rinse your body with warm water from the neck down. ? 8.  DO NOT shower/wash with your normal soap after using and rinsing off the CHG Soap. ?               9.  Pat yourself dry with a clean towel. ?           10.  Wear clean pajamas. ?           11.  Place clean sheets on your bed the night of your first shower and do not  sleep with pets. ?Day of Surgery : ?Do not apply any lotions/deodorants the morning of surgery.  Please wear clean clothes to the hospital/surgery center. ? ?FAILURE TO FOLLOW THESE INSTRUCTIONS MAY RESULT IN THE CANCELLATION OF YOUR SURGERY ? ?PATIENT SIGNATURE_________________________________ ? ?NURSE  SIGNATURE__________________________________ ? ?________________________________________________________________________  ? ?Incentive Spirometer ? ?An incentive spirometer is a tool that can help keep your lungs clear and active. This tool measures how well you are filling your lungs with each breath. Taking long deep breaths may help reverse or decrease the chance of developing breathing (pulmonary) problems (especially infection) following: ?A long period of time when you are unable to move or be active. ?BEFORE THE PROCEDURE  ?If the spirometer includes an indicator to show your best effort, your nurse or respiratory therapist will set it to a desired goal. ?If possible, sit up straight or lean slightly forward. Try not to slouch. ?Hold the incentive spirometer in an upright position. ?INSTRUCTIONS FOR USE  ?Sit on the edge of your bed if possible, or sit up as far as you can in bed or on a chair. ?Hold the incentive spirometer in an upright position. ?Breathe out normally. ?Place the mouthpiece in your mouth and seal your lips tightly around it. ?Breathe in slowly and as deeply as possible, raising the piston or the ball toward the top of the column. ?Hold your breath for 3-5 seconds or for as long as possible. Allow the piston or ball to fall to the bottom of the column. ?Remove the mouthpiece from your mouth and breathe out normally. ?Rest for a few seconds and repeat Steps 1 through 7 at least 10 times every 1-2 hours when you are awake. Take your time and take a few normal breaths between deep breaths. ?The spirometer may include an indicator to show your best effort. Use the indicator as a goal to work toward during each repetition. ?After each set of 10 deep breaths, practice coughing to be sure your lungs are clear. If you have an incision (the cut made at the time of surgery), support your incision when coughing by placing a pillow or rolled up towels firmly against it. ?Once you are able to get out of  bed, walk around indoors and cough well. You may stop using the incentive spirometer when instructed by your caregiver.  ?RISKS AND COMPLICATIONS ?Take your time so you do not get dizzy or light-headed. ?If you are in pain, you may need to take or ask for p

## 2021-12-16 ENCOUNTER — Encounter (HOSPITAL_COMMUNITY)
Admission: RE | Admit: 2021-12-16 | Discharge: 2021-12-16 | Disposition: A | Discharge status: Home / Self Care | Payer: Medicare HMO | Source: Ambulatory Visit | Attending: Orthopaedic Surgery | Admitting: Orthopaedic Surgery

## 2021-12-16 ENCOUNTER — Encounter (HOSPITAL_COMMUNITY): Payer: Self-pay

## 2021-12-16 VITALS — BP 151/102 | HR 88 | Temp 99.0°F | Resp 16 | Ht <= 58 in | Wt 145.0 lb

## 2021-12-16 DIAGNOSIS — I2510 Atherosclerotic heart disease of native coronary artery without angina pectoris: Secondary | ICD-10-CM | POA: Diagnosis not present

## 2021-12-16 DIAGNOSIS — Z01818 Encounter for other preprocedural examination: Secondary | ICD-10-CM | POA: Diagnosis not present

## 2021-12-16 DIAGNOSIS — M1711 Unilateral primary osteoarthritis, right knee: Secondary | ICD-10-CM

## 2021-12-16 HISTORY — DX: Essential (primary) hypertension: I10

## 2021-12-16 LAB — CBC
HCT: 42.3 % (ref 36.0–46.0)
Hemoglobin: 13.6 g/dL (ref 12.0–15.0)
MCH: 33.2 pg (ref 26.0–34.0)
MCHC: 32.2 g/dL (ref 30.0–36.0)
MCV: 103.2 fL — ABNORMAL HIGH (ref 80.0–100.0)
Platelets: 272 10*3/uL (ref 150–400)
RBC: 4.1 MIL/uL (ref 3.87–5.11)
RDW: 12.8 % (ref 11.5–15.5)
WBC: 7.8 10*3/uL (ref 4.0–10.5)
nRBC: 0 % (ref 0.0–0.2)

## 2021-12-16 LAB — TYPE AND SCREEN
ABO/RH(D): O POS
Antibody Screen: NEGATIVE

## 2021-12-16 LAB — SURGICAL PCR SCREEN
MRSA, PCR: NEGATIVE
Staphylococcus aureus: NEGATIVE

## 2021-12-17 NOTE — Progress Notes (Signed)
BMP hemolyzed, put in order for DOS. ?

## 2021-12-25 MED ORDER — SODIUM CHLORIDE (PF) 0.9 % IJ SOLN
INTRAMUSCULAR | Status: AC
  Filled 2021-12-25: qty 50

## 2021-12-26 DIAGNOSIS — M1711 Unilateral primary osteoarthritis, right knee: Secondary | ICD-10-CM

## 2021-12-26 NOTE — H&P (Signed)
TOTAL KNEE ADMISSION H&P ? ?Patient is being admitted for right total knee arthroplasty. ? ?Subjective: ? ?Chief Complaint:right knee pain. ? ?HPI: Kaitlyn [name], 66 y.o. female, has a history of pain and functional disability in the right knee due to arthritis and has failed non-surgical conservative treatments for greater than 12 weeks to includeNSAID's and/or analgesics, corticosteriod injections, viscosupplementation injections, flexibility and strengthening excercises, use of assistive devices, weight reduction as appropriate, and activity modification.  Onset of symptoms was gradual, starting 2 years ago with gradually worsening course since that time. The patient noted no past surgery on the right knee(s).  Patient currently rates pain in the right knee(s) at 10 out of 10 with activity. Patient has night pain, worsening of pain with activity and weight bearing, pain that interferes with activities of daily living, pain with passive range of motion, crepitus, and joint swelling.  Patient has evidence of subchondral sclerosis, periarticular osteophytes, and joint space narrowing by imaging studies. There is no active infection. ? ?Patient Active Problem List  ? Diagnosis Date Noted  ? Unilateral primary osteoarthritis, right knee 12/26/2021  ? Unilateral primary osteoarthritis, left knee 11/25/2019  ? Status post total left knee replacement 11/25/2019  ? S/P shoulder replacement, left 08/05/2018  ? Status post total shoulder arthroplasty, right 04/15/2018  ? Cellulitis of right hip 01/01/2018  ? Avascular necrosis of bones of both hips (HCC) 11/06/2017  ? Status post bilateral total hip replacement 11/06/2017  ? Bilateral hip pain 08/31/2017  ? Avascular necrosis of hip, left (HCC) 08/31/2017  ? Avascular necrosis of hip, right (HCC) 08/31/2017  ? ?Past Medical History:  ?Diagnosis Date  ? Anemia   ? Anxiety   ? Arthritis   ? oa  ? COVID 06/2020  ? asymptomatic, took monoclonal antibodies  ? Depression   ?  Frequency of urination   ? GERD (gastroesophageal reflux disease)   ? CONTROLLED W/ PRILOSEC  ? Headache   ? hx of migraines  ? History of recent blood transfusion 2018  ? Hyperlipemia   ? Hypertension   ? Hypothyroidism   ? Incontinence of urine   ? Insomnia   ? Interstitial cystitis   ? Pelvic pain syndrome I.C.  ? PONV (postoperative nausea and vomiting) yrs ago  ? Urgency of urination   ? Wears dentures   ? upper  ? Wears glasses   ?  ?Past Surgical History:  ?Procedure Laterality Date  ? BILATERAL ANTERIOR TOTAL HIP ARTHROPLASTY Bilateral 11/06/2017  ? Procedure: BILATERAL TOTAL HIP ARTHROPLASTY ANTERIOR APPROACH;  Surgeon: Kathryne Hitch, MD;  Location: [facility];  Service: Orthopedics;  Laterality: Bilateral;  ? CYSTO WITH HYDRODISTENSION  09/18/2011  ? Procedure: CYSTOSCOPY/HYDRODISTENSION;  Surgeon: Anner Crete;  Location: [facility];  Service: Urology;  Laterality: N/A;  Instillation of Marcaine & Pyridium  ? CYSTO WITH HYDRODISTENSION N/A 05/31/2013  ? Procedure: CYSTOSCOPY/HYDRODISTENSION INSTALLATION OF MARCAINE/PYRIDIUM;  Surgeon: Antony Haste, MD;  Location: [facility];  Service: Urology;  Laterality: N/A;  ? CYSTO WITH HYDRODISTENSION N/A 01/24/2014  ? Procedure: CYSTOSCOPY/HYDRODISTENSION INSTALLATION OF MARCAINE AND PYRIDIUM;  Surgeon: Anner Crete, MD;  Location: [facility];  Service: Urology;  Laterality: N/A;  ? CYSTO WITH HYDRODISTENSION N/A 08/03/2014  ? Procedure: CYSTO/HYDRODISTENSION OF BLADDER/INSTILL  PYRIDIUM/MARCAINE;  Surgeon: Anner Crete, MD;  Location: [facility];  Service: Urology;  Laterality: N/A;  ? CYSTO WITH HYDRODISTENSION N/A 11/08/2015  ? Procedure: CYSTOSCOPY/HYDRODISTENSION;  Surgeon: Bjorn Pippin,  MD;  Location: [facility];  Service: Urology;  Laterality: N/A;  ? CYSTO WITH HYDRODISTENSION N/A 12/11/2016  ? Procedure: CYSTOSCOPY/HYDRODISTENSION;  Surgeon: Bjorn Pippin, MD;  Location:  [facility];  Service: Urology;  Laterality: N/A;  ? CYSTO WITH HYDRODISTENSION N/A 01/18/2021  ? Procedure: CYSTOSCOPY/HYDRODISTENSION OF BLADDER INSTILL MARCAINE AND PYRIDIUM;  Surgeon: Bjorn Pippin, MD;  Location: [facility];  Service: Urology;  Laterality: N/A;  ? CYSTO/ HOD/ INSTILLATION THERAPY  LAST ONE 08-27-2010  ? MULTIPLE TIMES  ? CYSTOSCOPY WITH HYDRODISTENSION AND BIOPSY N/A 07/05/2019  ? Procedure: CYSTOSCOPY, HYDRODISTENSION/ INSTILATION MARCAINE AND PYRIDIUM;  Surgeon: Bjorn Pippin, MD;  Location: [facility];  Service: Urology;  Laterality: N/A;  ? CYSTOSCOPY WITH HYDRODISTENSION AND BIOPSY N/A 10/04/2021  ? Procedure: CYSTOSCOPY/BIOPSY/HYDRODISTENSION INSTILL MARCAINE AND PYRIDIUM;  Surgeon: Bjorn Pippin, MD;  Location: [facility];  Service: Urology;  Laterality: N/A;  ? INCISION AND DRAINAGE HIP Right 01/01/2018  ? Procedure: IRRIGATION AND DEBRIDEMENT RIGHT HIP INCISION;  Surgeon: Kathryne Hitch, MD;  Location: [facility];  Service: Orthopedics;  Laterality: Right;  ? JOINT REPLACEMENT    ? bilateral hip c. Derricka Mertz 11-06-17  ? TONSILLECTOMY  as child  ? TOTAL KNEE ARTHROPLASTY Left 11/25/2019  ? Procedure: LEFT TOTAL KNEE ARTHROPLASTY;  Surgeon: Kathryne Hitch, MD;  Location: [facility];  Service: Orthopedics;  Laterality: Left;  ? TOTAL SHOULDER ARTHROPLASTY Right 04/15/2018  ? Procedure: TOTAL SHOULDER ARTHROPLASTY;  Surgeon: Jones Broom, MD;  Location: [facility];  Service: Orthopedics;  Laterality: Right;  ? TOTAL SHOULDER ARTHROPLASTY Left 08/05/2018  ? Procedure: LEFT TOTAL SHOULDER ARTHROPLASTY;  Surgeon: Jones Broom, MD;  Location: [facility];  Service: Orthopedics;  Laterality: Left;  ? TRANSTHORACIC ECHOCARDIOGRAM  07-06-2012  ? mild LVH, ef 55-65%, grade 1 diastolic dysfunction/  mild AR/  trivial MR and TR  ? VAGINAL HYSTERECTOMY  1980'S  ? complete  ?  ?No current facility-administered medications for this encounter.  ? ?Current Outpatient  Medications  ?Medication Sig Dispense Refill Last Dose  ? amLODipine (NORVASC) 5 MG tablet Take 5 mg by mouth daily.     ? divalproex (DEPAKOTE) 500 MG DR tablet Take 500 mg by mouth daily.  3   ? escitalopram (LEXAPRO) 20 MG tablet Take 20 mg by mouth at bedtime.     ? gabapentin (NEURONTIN) 300 MG capsule Take 300 mg by mouth 2 (two) times daily.  1   ? levothyroxine (SYNTHROID) 100 MCG tablet Take 100 mcg by mouth daily before breakfast.      ? LORazepam (ATIVAN) 0.5 MG tablet Take 0.5 mg by mouth daily as needed for anxiety.     ? Melatonin 10 MG TABS Take 10 mg by mouth at bedtime.     ? ondansetron (ZOFRAN ODT) 4 MG disintegrating tablet Take 1 tablet (4 mg total) by mouth every 8 (eight) hours as needed for nausea or vomiting. 20 tablet 0   ? pantoprazole (PROTONIX) 40 MG tablet Take 40 mg by mouth 2 (two) times daily.     ? Prenatal Vit-Fe Fumarate-FA (MULTIVITAMIN-PRENATAL) 27-0.8 MG TABS tablet Take 1 tablet by mouth daily.     ? promethazine (PHENERGAN) 25 MG tablet Take 25 mg by mouth every 6 (six) hours as needed for vomiting or nausea.     ? QUEtiapine (SEROQUEL) 200 MG tablet Take 200 mg by mouth at bedtime.     ? rizatriptan (MAXALT) 10 MG tablet Take 10 mg by mouth as  needed for migraine.   3   ? HYDROcodone-acetaminophen (NORCO/VICODIN) 5-325 MG tablet Take 1 tablet by mouth every 6 (six) hours as needed for moderate pain. (Patient not taking: Reported on 12/13/2021) 12 tablet 0 Not Taking  ? methocarbamol (ROBAXIN) 500 MG tablet TAKE 1 TABLET BY MOUTH EVERY 6 HOURS AS NEEDED FOR MUSCLE SPASMS. (Patient not taking: Reported on 10/04/2021) 60 tablet 1   ? phenazopyridine (PYRIDIUM) 200 MG tablet Take 1 tablet (200 mg total) by mouth 3 (three) times daily as needed for pain. (Patient not taking: Reported on 12/13/2021) 90 tablet 11 Not Taking  ? ?Facility-Administered Medications Ordered in Other Encounters  ?Medication Dose Route Frequency Provider Last Rate Last Admin  ? bupivacaine (MARCAINE) 0.5 %  15 mL, phenazopyridine (PYRIDIUM) 400 mg bladder mixture   Bladder Instillation Once Bjorn PippinWrenn, John, MD      ? ?Allergies  ?Allergen Reactions  ? Sulfa Antibiotics Rash  ?  ?Social History  ? ?Tobacco Use  ? Smok

## 2021-12-26 NOTE — Anesthesia Preprocedure Evaluation (Addendum)
Anesthesia Evaluation  ?Patient identified by MRN, date of birth, ID band ?Patient awake ? ? ? ?Reviewed: ?Allergy & Precautions, NPO status , Patient's Chart, lab work & pertinent test results ? ?History of Anesthesia Complications ?(+) PONV and history of anesthetic complications ? ?Airway ?Mallampati: I ? ?TM Distance: >3 FB ?Neck ROM: Full ? ? ? Dental ? ?(+) Edentulous Upper, Dental Advisory Given ?  ?Pulmonary ?Current Smoker and Patient abstained from smoking.,  ?  ?Pulmonary exam normal ? ? ? ? ? ? ? Cardiovascular ?Exercise Tolerance: Good ?hypertension, Pt. on medications ?negative cardio ROS ?Normal cardiovascular exam ? ? ?  ?Neuro/Psych ? Headaches, PSYCHIATRIC DISORDERS Anxiety Depression   ? GI/Hepatic ?Neg liver ROS, GERD  ,  ?Endo/Other  ?Hypothyroidism  ? Renal/GU ?negative Renal ROS  ? ?Interstitial cystitis ? ?  ?Musculoskeletal ? ?(+) Arthritis ,  ? Abdominal ?  ?Peds ? Hematology ?negative hematology ROS ?(+)   ?Anesthesia Other Findings ? ? Reproductive/Obstetrics ?negative OB ROS ? ?  ? ? ? ? ? ? ? ? ? ? ? ? ? ?  ?  ? ? ? ? ? ? ? ?Anesthesia Physical ? ?Anesthesia Plan ? ?ASA: 2 ? ?Anesthesia Plan: Spinal and MAC  ? ?Post-op Pain Management: Celebrex PO (pre-op)*, Tylenol PO (pre-op)* and Regional block*  ? ?Induction:  ? ?PONV Risk Score and Plan: 3 and Midazolam, Treatment may vary due to age or medical condition, Ondansetron and Propofol infusion ? ?Airway Management Planned: Natural Airway and Simple Face Mask ? ?Additional Equipment:  ? ?Intra-op Plan:  ? ?Post-operative Plan:  ? ?Informed Consent: I have reviewed the patients History and Physical, chart, labs and discussed the procedure including the risks, benefits and alternatives for the proposed anesthesia with the patient or authorized representative who has indicated his/her understanding and acceptance.  ? ? ? ?Dental advisory given ? ?Plan Discussed with: Anesthesiologist and CRNA ? ?Anesthesia  Plan Comments:   ? ? ? ? ? ?Anesthesia Quick Evaluation ? ?

## 2021-12-27 ENCOUNTER — Ambulatory Visit (HOSPITAL_BASED_OUTPATIENT_CLINIC_OR_DEPARTMENT_OTHER): Payer: Medicare HMO | Admitting: Anesthesiology

## 2021-12-27 ENCOUNTER — Observation Stay (HOSPITAL_COMMUNITY)
Admission: RE | Admit: 2021-12-27 | Discharge: 2021-12-28 | Disposition: A | Discharge status: Home Health | Payer: Medicare HMO | Source: Ambulatory Visit | Attending: Orthopaedic Surgery | Admitting: Orthopaedic Surgery

## 2021-12-27 ENCOUNTER — Observation Stay (HOSPITAL_COMMUNITY): Payer: Medicare HMO

## 2021-12-27 ENCOUNTER — Encounter (HOSPITAL_COMMUNITY): Payer: Self-pay | Admitting: Orthopaedic Surgery

## 2021-12-27 ENCOUNTER — Encounter (HOSPITAL_COMMUNITY)
Admission: RE | Disposition: A | Discharge status: Home Health | Payer: Self-pay | Source: Ambulatory Visit | Attending: Orthopaedic Surgery

## 2021-12-27 ENCOUNTER — Ambulatory Visit (HOSPITAL_COMMUNITY): Payer: Medicare HMO | Admitting: Anesthesiology

## 2021-12-27 ENCOUNTER — Other Ambulatory Visit: Payer: Self-pay

## 2021-12-27 DIAGNOSIS — Z96612 Presence of left artificial shoulder joint: Secondary | ICD-10-CM | POA: Diagnosis not present

## 2021-12-27 DIAGNOSIS — Z96643 Presence of artificial hip joint, bilateral: Secondary | ICD-10-CM | POA: Insufficient documentation

## 2021-12-27 DIAGNOSIS — F17210 Nicotine dependence, cigarettes, uncomplicated: Secondary | ICD-10-CM | POA: Insufficient documentation

## 2021-12-27 DIAGNOSIS — Z79899 Other long term (current) drug therapy: Secondary | ICD-10-CM | POA: Diagnosis not present

## 2021-12-27 DIAGNOSIS — M1711 Unilateral primary osteoarthritis, right knee: Principal | ICD-10-CM | POA: Insufficient documentation

## 2021-12-27 DIAGNOSIS — E039 Hypothyroidism, unspecified: Secondary | ICD-10-CM | POA: Insufficient documentation

## 2021-12-27 DIAGNOSIS — Z96652 Presence of left artificial knee joint: Secondary | ICD-10-CM | POA: Diagnosis not present

## 2021-12-27 DIAGNOSIS — Z96651 Presence of right artificial knee joint: Secondary | ICD-10-CM

## 2021-12-27 DIAGNOSIS — I2510 Atherosclerotic heart disease of native coronary artery without angina pectoris: Secondary | ICD-10-CM

## 2021-12-27 DIAGNOSIS — Z96611 Presence of right artificial shoulder joint: Secondary | ICD-10-CM | POA: Diagnosis not present

## 2021-12-27 DIAGNOSIS — Z8616 Personal history of COVID-19: Secondary | ICD-10-CM | POA: Diagnosis not present

## 2021-12-27 DIAGNOSIS — I10 Essential (primary) hypertension: Secondary | ICD-10-CM | POA: Diagnosis not present

## 2021-12-27 HISTORY — PX: TOTAL KNEE ARTHROPLASTY: SHX125

## 2021-12-27 LAB — BASIC METABOLIC PANEL
Anion gap: 10 (ref 5–15)
BUN: 21 mg/dL (ref 8–23)
CO2: 23 mmol/L (ref 22–32)
Calcium: 9 mg/dL (ref 8.9–10.3)
Chloride: 106 mmol/L (ref 98–111)
Creatinine, Ser: 1.13 mg/dL — ABNORMAL HIGH (ref 0.44–1.00)
GFR, Estimated: 54 mL/min — ABNORMAL LOW (ref >60)
Glucose, Bld: 167 mg/dL — ABNORMAL HIGH (ref 70–99)
Potassium: 3.4 mmol/L — ABNORMAL LOW (ref 3.5–5.1)
Sodium: 139 mmol/L (ref 135–145)

## 2021-12-27 SURGERY — ARTHROPLASTY, KNEE, TOTAL
Anesthesia: Monitor Anesthesia Care | Site: Knee | Laterality: Right

## 2021-12-27 MED ORDER — HYDROMORPHONE HCL 2 MG PO TABS: 2.0000 mg | ORAL_TABLET | ORAL | Status: DC | PRN

## 2021-12-27 MED ORDER — HYDROMORPHONE HCL 1 MG/ML IJ SOLN
0.5000 mg | INTRAMUSCULAR | Status: DC | PRN
  Administered 2021-12-27: 1 mg via INTRAVENOUS
  Filled 2021-12-27: qty 1

## 2021-12-27 MED ORDER — AMLODIPINE BESYLATE 5 MG PO TABS
5.0000 mg | ORAL_TABLET | Freq: Every day | ORAL | Status: DC
  Administered 2021-12-28: 5 mg via ORAL
  Filled 2021-12-27: qty 1

## 2021-12-27 MED ORDER — METHOCARBAMOL 500 MG IVPB - SIMPLE MED
INTRAVENOUS | Status: AC
  Filled 2021-12-27: qty 50

## 2021-12-27 MED ORDER — FENTANYL CITRATE PF 50 MCG/ML IJ SOSY
50.0000 ug | PREFILLED_SYRINGE | INTRAMUSCULAR | Status: DC
  Administered 2021-12-27 (×2): 50 ug via INTRAVENOUS
  Filled 2021-12-27: qty 2

## 2021-12-27 MED ORDER — ONDANSETRON HCL 4 MG PO TABS
4.0000 mg | ORAL_TABLET | Freq: Four times a day (QID) | ORAL | Status: DC | PRN
  Administered 2021-12-28: 4 mg via ORAL
  Filled 2021-12-27: qty 1

## 2021-12-27 MED ORDER — GABAPENTIN 300 MG PO CAPS
300.0000 mg | ORAL_CAPSULE | Freq: Two times a day (BID) | ORAL | Status: DC
  Administered 2021-12-27 – 2021-12-28 (×3): 300 mg via ORAL
  Filled 2021-12-27 (×3): qty 1

## 2021-12-27 MED ORDER — CEFAZOLIN SODIUM-DEXTROSE 2-4 GM/100ML-% IV SOLN
2.0000 g | INTRAVENOUS | Status: AC
  Administered 2021-12-27: 2 g via INTRAVENOUS
  Filled 2021-12-27: qty 100

## 2021-12-27 MED ORDER — CEFAZOLIN SODIUM-DEXTROSE 1-4 GM/50ML-% IV SOLN
1.0000 g | Freq: Four times a day (QID) | INTRAVENOUS | Status: AC
  Administered 2021-12-27 (×2): 1 g via INTRAVENOUS
  Filled 2021-12-27 (×2): qty 50

## 2021-12-27 MED ORDER — ESCITALOPRAM OXALATE 20 MG PO TABS
20.0000 mg | ORAL_TABLET | Freq: Every day | ORAL | Status: DC
  Administered 2021-12-27: 20 mg via ORAL
  Filled 2021-12-27: qty 1

## 2021-12-27 MED ORDER — LACTATED RINGERS IV SOLN
INTRAVENOUS | Status: DC
Start: 2021-12-27 — End: 2021-12-27

## 2021-12-27 MED ORDER — ACETAMINOPHEN 325 MG PO TABS: 325.0000 mg | ORAL_TABLET | Freq: Four times a day (QID) | ORAL | Status: DC | PRN

## 2021-12-27 MED ORDER — DEXAMETHASONE SODIUM PHOSPHATE 10 MG/ML IJ SOLN
INTRAMUSCULAR | Status: AC
  Filled 2021-12-27: qty 1

## 2021-12-27 MED ORDER — PROPOFOL 10 MG/ML IV BOLUS
INTRAVENOUS | Status: DC | PRN
  Administered 2021-12-27 (×2): 20 mg via INTRAVENOUS

## 2021-12-27 MED ORDER — ALUM & MAG HYDROXIDE-SIMETH 200-200-20 MG/5ML PO SUSP: 30.0000 mL | ORAL | Status: DC | PRN

## 2021-12-27 MED ORDER — BUPIVACAINE IN DEXTROSE 0.75-8.25 % IT SOLN
INTRATHECAL | Status: DC | PRN
  Administered 2021-12-27: 1.4 mL via INTRATHECAL

## 2021-12-27 MED ORDER — OXYCODONE HCL 5 MG PO TABS
5.0000 mg | ORAL_TABLET | ORAL | Status: DC | PRN
  Administered 2021-12-27 – 2021-12-28 (×4): 10 mg via ORAL
  Filled 2021-12-27 (×2): qty 2

## 2021-12-27 MED ORDER — MENTHOL 3 MG MT LOZG: 1.0000 | LOZENGE | OROMUCOSAL | Status: DC | PRN

## 2021-12-27 MED ORDER — 0.9 % SODIUM CHLORIDE (POUR BTL) OPTIME
TOPICAL | Status: DC | PRN
  Administered 2021-12-27: 1000 mL

## 2021-12-27 MED ORDER — METOCLOPRAMIDE HCL 5 MG PO TABS: 5.0000 mg | ORAL_TABLET | Freq: Three times a day (TID) | ORAL | Status: DC | PRN

## 2021-12-27 MED ORDER — PHENOL 1.4 % MT LIQD: 1.0000 | OROMUCOSAL | Status: DC | PRN

## 2021-12-27 MED ORDER — DIVALPROEX SODIUM 250 MG PO DR TAB
500.0000 mg | DELAYED_RELEASE_TABLET | Freq: Every day | ORAL | Status: DC
  Administered 2021-12-28: 500 mg via ORAL
  Filled 2021-12-27: qty 2

## 2021-12-27 MED ORDER — POVIDONE-IODINE 10 % EX SWAB
2.0000 "application " | Freq: Once | CUTANEOUS | Status: AC
  Administered 2021-12-27: 2 via TOPICAL

## 2021-12-27 MED ORDER — SODIUM CHLORIDE 0.9 % IR SOLN
Status: DC | PRN
  Administered 2021-12-27: 1000 mL

## 2021-12-27 MED ORDER — STERILE WATER FOR IRRIGATION IR SOLN
Status: DC | PRN
  Administered 2021-12-27: 2000 mL

## 2021-12-27 MED ORDER — PROPOFOL 500 MG/50ML IV EMUL
INTRAVENOUS | Status: DC | PRN
  Administered 2021-12-27: 125 ug/kg/min via INTRAVENOUS

## 2021-12-27 MED ORDER — HYDROMORPHONE HCL 2 MG PO TABS: 1.0000 mg | ORAL_TABLET | ORAL | Status: DC | PRN

## 2021-12-27 MED ORDER — CHLORHEXIDINE GLUCONATE 0.12 % MT SOLN
15.0000 mL | Freq: Once | OROMUCOSAL | Status: AC
  Administered 2021-12-27: 15 mL via OROMUCOSAL

## 2021-12-27 MED ORDER — DEXAMETHASONE SODIUM PHOSPHATE 10 MG/ML IJ SOLN
INTRAMUSCULAR | Status: DC | PRN
  Administered 2021-12-27: 5 mg

## 2021-12-27 MED ORDER — SODIUM CHLORIDE 0.9 % IV SOLN
INTRAVENOUS | Status: DC
  Administered 2021-12-27: 1000 mL via INTRAVENOUS

## 2021-12-27 MED ORDER — QUETIAPINE FUMARATE 50 MG PO TABS
200.0000 mg | ORAL_TABLET | Freq: Every day | ORAL | Status: DC
Start: 2021-12-27 — End: 2021-12-28
  Administered 2021-12-27: 200 mg via ORAL
  Filled 2021-12-27: qty 4

## 2021-12-27 MED ORDER — ONDANSETRON HCL 4 MG/2ML IJ SOLN
INTRAMUSCULAR | Status: DC | PRN
  Administered 2021-12-27: 4 mg via INTRAVENOUS

## 2021-12-27 MED ORDER — PANTOPRAZOLE SODIUM 40 MG PO TBEC
40.0000 mg | DELAYED_RELEASE_TABLET | Freq: Two times a day (BID) | ORAL | Status: DC
  Administered 2021-12-27 – 2021-12-28 (×3): 40 mg via ORAL
  Filled 2021-12-27 (×3): qty 1

## 2021-12-27 MED ORDER — MIDAZOLAM HCL 2 MG/2ML IJ SOLN
1.0000 mg | INTRAMUSCULAR | Status: DC
  Administered 2021-12-27: 1 mg via INTRAVENOUS
  Filled 2021-12-27: qty 2

## 2021-12-27 MED ORDER — ROPIVACAINE HCL 7.5 MG/ML IJ SOLN
INTRAMUSCULAR | Status: DC | PRN
  Administered 2021-12-27: 20 mL via PERINEURAL

## 2021-12-27 MED ORDER — ONDANSETRON HCL 4 MG/2ML IJ SOLN
INTRAMUSCULAR | Status: AC
  Filled 2021-12-27: qty 2

## 2021-12-27 MED ORDER — OXYCODONE HCL 5 MG PO TABS
10.0000 mg | ORAL_TABLET | ORAL | Status: DC | PRN
  Administered 2021-12-27 (×2): 15 mg via ORAL
  Filled 2021-12-27 (×2): qty 3
  Filled 2021-12-27: qty 2

## 2021-12-27 MED ORDER — PROMETHAZINE HCL 25 MG PO TABS: 25.0000 mg | ORAL_TABLET | Freq: Four times a day (QID) | ORAL | Status: DC | PRN

## 2021-12-27 MED ORDER — OXYCODONE HCL 5 MG PO TABS
ORAL_TABLET | ORAL | Status: AC
  Filled 2021-12-27: qty 2

## 2021-12-27 MED ORDER — ORAL CARE MOUTH RINSE: 15.0000 mL | Freq: Once | OROMUCOSAL | Status: AC

## 2021-12-27 MED ORDER — METHOCARBAMOL 500 MG IVPB - SIMPLE MED
500.0000 mg | Freq: Four times a day (QID) | INTRAVENOUS | Status: DC | PRN
  Filled 2021-12-27: qty 50

## 2021-12-27 MED ORDER — TRANEXAMIC ACID-NACL 1000-0.7 MG/100ML-% IV SOLN
1000.0000 mg | INTRAVENOUS | Status: AC
  Administered 2021-12-27: 1000 mg via INTRAVENOUS
  Filled 2021-12-27: qty 100

## 2021-12-27 MED ORDER — MELATONIN 5 MG PO TABS
10.0000 mg | ORAL_TABLET | Freq: Every day | ORAL | Status: DC
Start: 2021-12-27 — End: 2021-12-28
  Administered 2021-12-27: 10 mg via ORAL
  Filled 2021-12-27: qty 2

## 2021-12-27 MED ORDER — FENTANYL CITRATE PF 50 MCG/ML IJ SOSY
PREFILLED_SYRINGE | INTRAMUSCULAR | Status: AC
  Filled 2021-12-27: qty 3

## 2021-12-27 MED ORDER — DOCUSATE SODIUM 100 MG PO CAPS
100.0000 mg | ORAL_CAPSULE | Freq: Two times a day (BID) | ORAL | Status: DC
  Administered 2021-12-27 – 2021-12-28 (×2): 100 mg via ORAL
  Filled 2021-12-27 (×2): qty 1

## 2021-12-27 MED ORDER — DEXAMETHASONE SODIUM PHOSPHATE 10 MG/ML IJ SOLN
INTRAMUSCULAR | Status: DC | PRN
  Administered 2021-12-27: 5 mg via INTRAVENOUS

## 2021-12-27 MED ORDER — DIPHENHYDRAMINE HCL 12.5 MG/5ML PO ELIX: 12.5000 mg | ORAL_SOLUTION | ORAL | Status: DC | PRN

## 2021-12-27 MED ORDER — LORAZEPAM 0.5 MG PO TABS: 0.5000 mg | ORAL_TABLET | Freq: Every day | ORAL | Status: DC | PRN

## 2021-12-27 MED ORDER — PROPOFOL 1000 MG/100ML IV EMUL
INTRAVENOUS | Status: AC
  Filled 2021-12-27: qty 100

## 2021-12-27 MED ORDER — METHOCARBAMOL 500 MG PO TABS
500.0000 mg | ORAL_TABLET | Freq: Four times a day (QID) | ORAL | Status: DC | PRN
  Administered 2021-12-27 – 2021-12-28 (×3): 500 mg via ORAL
  Filled 2021-12-27 (×3): qty 1

## 2021-12-27 MED ORDER — ONDANSETRON HCL 4 MG/2ML IJ SOLN
4.0000 mg | Freq: Four times a day (QID) | INTRAMUSCULAR | Status: DC | PRN
  Administered 2021-12-27: 4 mg via INTRAVENOUS
  Filled 2021-12-27: qty 2

## 2021-12-27 MED ORDER — METOCLOPRAMIDE HCL 5 MG/ML IJ SOLN: 5.0000 mg | Freq: Three times a day (TID) | INTRAMUSCULAR | Status: DC | PRN

## 2021-12-27 MED ORDER — ASPIRIN 81 MG PO CHEW
81.0000 mg | CHEWABLE_TABLET | Freq: Two times a day (BID) | ORAL | Status: DC
  Administered 2021-12-27 – 2021-12-28 (×2): 81 mg via ORAL
  Filled 2021-12-27 (×2): qty 1

## 2021-12-27 MED ORDER — LEVOTHYROXINE SODIUM 100 MCG PO TABS
100.0000 ug | ORAL_TABLET | Freq: Every day | ORAL | Status: DC
  Administered 2021-12-28: 100 ug via ORAL
  Filled 2021-12-27: qty 1

## 2021-12-27 SURGICAL SUPPLY — 63 items
BAG COUNTER SPONGE SURGICOUNT (BAG) ×1 IMPLANT
BAG ZIPLOCK 12X15 (MISCELLANEOUS) ×2 IMPLANT
BASEPLATE TIBIAL TRIATHALON 3 (Plate) ×1 IMPLANT
BEARIN INSERT TIBIAL SZ 3 11 (Insert) ×2 IMPLANT
BEARING INSERT TIBIAL SZ 3 11 (Insert) IMPLANT
BENZOIN TINCTURE PRP APPL 2/3 (GAUZE/BANDAGES/DRESSINGS) ×1 IMPLANT
BLADE SAG 18X100X1.27 (BLADE) ×2 IMPLANT
BLADE SURG SZ10 CARB STEEL (BLADE) ×4 IMPLANT
BNDG ELASTIC 6X5.8 VLCR STR LF (GAUZE/BANDAGES/DRESSINGS) ×3 IMPLANT
BOWL SMART MIX CTS (DISPOSABLE) ×1 IMPLANT
CEMENT BONE SIMPLEX SPEEDSET (Cement) ×2 IMPLANT
CLSR STERI-STRIP ANTIMIC 1/2X4 (GAUZE/BANDAGES/DRESSINGS) ×1 IMPLANT
COOLER ICEMAN CLASSIC (MISCELLANEOUS) ×2 IMPLANT
COVER SURGICAL LIGHT HANDLE (MISCELLANEOUS) ×2 IMPLANT
CUFF TOURN SGL QUICK 34 (TOURNIQUET CUFF) ×2
CUFF TRNQT CYL 34X4.125X (TOURNIQUET CUFF) ×1 IMPLANT
DRAPE INCISE IOBAN 66X45 STRL (DRAPES) ×2 IMPLANT
DRAPE U-SHAPE 47X51 STRL (DRAPES) ×2 IMPLANT
DRSG AQUACEL AG ADV 3.5X10 (GAUZE/BANDAGES/DRESSINGS) ×1 IMPLANT
DRSG PAD ABDOMINAL 8X10 ST (GAUZE/BANDAGES/DRESSINGS) ×3 IMPLANT
DURAPREP 26ML APPLICATOR (WOUND CARE) ×2 IMPLANT
ELECT BLADE TIP CTD 4 INCH (ELECTRODE) ×2 IMPLANT
ELECT REM PT RETURN 15FT ADLT (MISCELLANEOUS) ×2 IMPLANT
FEMORAL PEG DISTAL FIXATION (Orthopedic Implant) ×1 IMPLANT
FEMORAL POST STABILIZED NO 3 (Orthopedic Implant) ×1 IMPLANT
GAUZE SPONGE 4X4 12PLY STRL (GAUZE/BANDAGES/DRESSINGS) ×2 IMPLANT
GAUZE XEROFORM 1X8 LF (GAUZE/BANDAGES/DRESSINGS) IMPLANT
GLOVE BIO SURGEON STRL SZ7.5 (GLOVE) ×4 IMPLANT
GLOVE BIOGEL PI IND STRL 8 (GLOVE) ×2 IMPLANT
GLOVE BIOGEL PI INDICATOR 8 (GLOVE) ×2
GLOVE SKINSENSE NS SZ8.0 LF (GLOVE) ×4
GLOVE SKINSENSE STRL SZ8.0 LF (GLOVE) ×1 IMPLANT
GOWN STRL REUS W/ TWL XL LVL3 (GOWN DISPOSABLE) ×2 IMPLANT
GOWN STRL REUS W/TWL XL LVL3 (GOWN DISPOSABLE) ×8
HANDPIECE INTERPULSE COAX TIP (DISPOSABLE) ×2
HOLDER FOLEY CATH W/STRAP (MISCELLANEOUS) ×1 IMPLANT
IMMOBILIZER KNEE 20 (SOFTGOODS) ×2
IMMOBILIZER KNEE 20 THIGH 36 (SOFTGOODS) ×1 IMPLANT
KIT TURNOVER KIT A (KITS) ×1 IMPLANT
NS IRRIG 1000ML POUR BTL (IV SOLUTION) ×2 IMPLANT
PACK TOTAL KNEE CUSTOM (KITS) ×2 IMPLANT
PAD COLD SHLDR WRAP-ON (PAD) ×2 IMPLANT
PADDING CAST COTTON 6X4 STRL (CAST SUPPLIES) ×4 IMPLANT
PADDING CAST SYN 6 (CAST SUPPLIES) ×1
PADDING CAST SYNTHETIC 6X4 NS (CAST SUPPLIES) IMPLANT
PATELLA TRIATHLON SZ 29 9 MM (Orthopedic Implant) ×1 IMPLANT
PIN FLUTED HEDLESS FIX 3.5X1/8 (PIN) ×1 IMPLANT
PROTECTOR NERVE ULNAR (MISCELLANEOUS) ×2 IMPLANT
SET HNDPC FAN SPRY TIP SCT (DISPOSABLE) ×1 IMPLANT
SET PAD KNEE POSITIONER (MISCELLANEOUS) ×2 IMPLANT
SPIKE FLUID TRANSFER (MISCELLANEOUS) IMPLANT
STAPLER VISISTAT 35W (STAPLE) IMPLANT
STRIP CLOSURE SKIN 1/2X4 (GAUZE/BANDAGES/DRESSINGS) ×1 IMPLANT
SUT MNCRL AB 4-0 PS2 18 (SUTURE) IMPLANT
SUT VIC AB 0 CT1 27 (SUTURE) ×2
SUT VIC AB 0 CT1 27XBRD ANTBC (SUTURE) ×1 IMPLANT
SUT VIC AB 1 CT1 36 (SUTURE) ×4 IMPLANT
SUT VIC AB 2-0 CT1 27 (SUTURE) ×4
SUT VIC AB 2-0 CT1 TAPERPNT 27 (SUTURE) ×2 IMPLANT
TRAY FOLEY MTR SLVR 14FR STAT (SET/KITS/TRAYS/PACK) ×1 IMPLANT
TRAY FOLEY MTR SLVR 16FR STAT (SET/KITS/TRAYS/PACK) IMPLANT
WATER STERILE IRR 1000ML POUR (IV SOLUTION) ×4 IMPLANT
WRAP KNEE MAXI GEL POST OP (GAUZE/BANDAGES/DRESSINGS) ×1 IMPLANT

## 2021-12-27 NOTE — Anesthesia Postprocedure Evaluation (Signed)
Anesthesia Post Note ? ?Patient: Kaitlyn [name] ? ?Procedure(s) Performed: RIGHT TOTAL KNEE ARTHROPLASTY (Right: Knee) ? ?  ? ?Patient location during evaluation: PACU ?Anesthesia Type: MAC and Spinal ?Level of consciousness: awake and alert ?Pain management: pain level controlled ?Vital Signs Assessment: post-procedure vital signs reviewed and stable ?Respiratory status: spontaneous breathing and respiratory function stable ?Cardiovascular status: blood pressure returned to baseline and stable ?Postop Assessment: spinal receding ?Anesthetic complications: no ? ? ?No notable events documented. ? ?Last Vitals:  ?Vitals:  ? 12/27/21 1045 12/27/21 1100  ?BP: 117/74 104/75  ?Pulse: 89 92  ?Resp: 12 13  ?Temp:    ?SpO2: 94% 94%  ?  ?Last Pain:  ?Vitals:  ? 12/27/21 1030  ?TempSrc:   ?PainSc: Asleep  ? ? ?  ?  ?  ?  ?  ?  ? ?[name] DANIEL ? ? ? ? ?

## 2021-12-27 NOTE — Anesthesia Procedure Notes (Signed)
Anesthesia Regional Block: Adductor canal block  ? ?Pre-Anesthetic Checklist: , timeout performed,  Correct Patient, Correct Site, Correct Laterality,  Correct Procedure, Correct Position, site marked,  Risks and benefits discussed,  Surgical consent,  Pre-op evaluation,  At surgeon's request and post-op pain management ? ?Laterality: Right ? ?Prep: chloraprep     ?  ?Needles:  ?Injection technique: Single-shot ? ?Needle Type: Stimulator Needle - 80   ? ? ?Needle Length: 10cm  ?Needle Gauge: 21  ? ? ? ?Additional Needles: ? ? ?Narrative:  ?Start time: 12/27/2021 7:53 AM ?End time: 12/27/2021 8:03 AM ?Injection made incrementally with aspirations every 5 mL. ? ?Performed by: Personally  ?Anesthesiologist: Heather Roberts, MD ? ? ? ?

## 2021-12-27 NOTE — Progress Notes (Signed)
Assisted Dr. Singer with right, adductor canal, ultrasound guided block. Side rails up, monitors on throughout procedure. See vital signs in flow sheet. Tolerated Procedure well.  

## 2021-12-27 NOTE — Brief Op Note (Signed)
12/27/2021 ? ?9:39 AM ? ?PATIENT:  Kaitlyn [name]  66 y.o. female ? ?PRE-OPERATIVE DIAGNOSIS:  RIGHT KNEE OSTEOARTHRITIS/ DEGENERATIVE JOINT DIEASE ? ?POST-OPERATIVE DIAGNOSIS:  RIGHT KNEE OSTEOARTHRITIS/ DEGENERATIVE JOINT DIEASE ? ?PROCEDURE:  Procedure(s): ?RIGHT TOTAL KNEE ARTHROPLASTY (Right) ? ?SURGEON:  Surgeon(s) and Role: ?   Kathryne Hitch, MD - Primary ? ?PHYSICIAN ASSISTANT:  Rexene Edison, PA-C ? ?ANESTHESIA:   regional and spinal ? ?COUNTS:  YES ? ?TOURNIQUET:   ?Total Tourniquet Time Documented: ?Thigh (Right) - 40 minutes ?Total: Thigh (Right) - 40 minutes ? ? ?DICTATION: .Other Dictation: Dictation Number [id number] ? ?PLAN OF CARE: Admit for overnight observation ? ?PATIENT DISPOSITION:  PACU - hemodynamically stable. ?  ?Delay start of Pharmacological VTE agent (>24hrs) due to surgical blood loss or risk of bleeding: no ? ?

## 2021-12-27 NOTE — Evaluation (Signed)
Physical Therapy Evaluation ?Patient Details ?Name: Kaitlyn AsaDebra K [name] ?MRN: [id number] ?DOB: 1956/06/08 ?Today's Date: 12/27/2021 ? ?History of Present Illness ? Pt is a 66yo female presenting s/p R-TKA on 12/27/21. PMH: anxiety and depression, OA, GERD, HA, HTN, hyperlipidemia, hypothyroidism, L-TKA 2021, B/L total shoulders 2019  ?Clinical Impression ? Kaitlyn [name] is a 66 y.o. female POD 0 s/p R-TKA. Patient reports modified independence with mobility at baseline. Patient is now limited by functional impairments (see PT problem list below) and requires min assist for bed mobility and transfers. Gait was deferred secondary to nausea. Patient instructed in exercise to facilitate ROM and circulation to manage edema. Patient will benefit from continued skilled PT interventions to address impairments and progress towards PLOF. Acute PT will follow to progress mobility and stair training in preparation for safe discharge home.   ?   ? ?Recommendations for follow up therapy are one component of a multi-disciplinary discharge planning process, led by the attending physician.  Recommendations may be updated based on patient status, additional functional criteria and insurance authorization. ? ?Follow Up Recommendations Follow physician's recommendations for discharge plan and follow up therapies ? ?  ?Assistance Recommended at Discharge Intermittent Supervision/Assistance  ?Patient can return home with the following ? A little help with walking and/or transfers;A little help with bathing/dressing/bathroom;Assistance with cooking/housework;Assist for transportation;Help with stairs or ramp for entrance ? ?  ?Equipment Recommendations Rolling walker (2 wheels) (Pt is 4'10" so needs a youth size walker if available.)  ?Recommendations for Other Services ?    ?  ?Functional Status Assessment Patient has had a recent decline in their functional status and demonstrates the ability to make significant improvements in function in a  reasonable and predictable amount of time.  ? ?  ?Precautions / Restrictions Precautions ?Precautions: Fall ?Restrictions ?Weight Bearing Restrictions: No ?Other Position/Activity Restrictions: WBAT  ? ?  ? ?Mobility ? Bed Mobility ?Overal bed mobility: Needs Assistance ?Bed Mobility: Supine to Sit ?  ?  ?Supine to sit: Min assist ?  ?  ?General bed mobility comments: Pt min assist for advancement of RLE off bed, min guard otherwise with VCs for hand placement. Upon reaching EOB, pt nauseous. With some seated resting, pt's symptoms did not change and pt agreeable to continue to transfers. ?  ? ?Transfers ?Overall transfer level: Needs assistance ?Equipment used: Rolling walker (2 wheels) ?Transfers: Sit to/from Stand, Bed to chair/wheelchair/BSC ?Sit to Stand: Min guard ?  ?Step pivot transfers: Min guard ?  ?  ?  ?General transfer comment: Pt min guard for transfers for safety, no physical assist required. Pt slightly impulsive and did not wait for directions prior to initiating step-pivot transfer bed to chair but was able to complete activity safely without overt LOB. VCs for sequencing and using BUE support on recliner to lower down. ?  ? ?Ambulation/Gait ?  ?  ?  ?  ?  ?  ?  ?General Gait Details: deferred ? ?Stairs ?  ?  ?  ?  ?  ? ?Wheelchair Mobility ?  ? ?Modified Rankin (Stroke Patients Only) ?  ? ?  ? ?Balance Overall balance assessment: Needs assistance ?Sitting-balance support: Feet supported, No upper extremity supported ?Sitting balance-Leahy Scale: Fair ?  ?  ?Standing balance support: Bilateral upper extremity supported, During functional activity, Reliant on assistive device for balance ?Standing balance-Leahy Scale: Poor ?  ?  ?  ?  ?  ?  ?  ?  ?  ?  ?  ?  ?   ? ? ? ?  Pertinent Vitals/Pain Pain Assessment ?Pain Assessment: 0-10 ?Pain Score: 4  ?Pain Location: R knee ?Pain Descriptors / Indicators: Operative site guarding, Discomfort ?Pain Intervention(s): Limited activity within patient's  tolerance, Monitored during session, Repositioned, Ice applied  ? ? ?Home Living Family/patient expects to be discharged to:: Private residence ?Living Arrangements: Spouse/significant other ?Available Help at Discharge: Available 24 hours/day;Family ?Type of Home: Mobile home ?Home Access: Stairs to enter ?Entrance Stairs-Rails: Right ?Entrance Stairs-Number of Steps: 5 ?  ?Home Layout: One level ?Home Equipment: Toilet riser;Cane - single point;Rollator (4 wheels);Shower seat ?   ?  ?Prior Function Prior Level of Function : Independent/Modified Independent ?  ?  ?  ?  ?  ?  ?Mobility Comments: SPC for mobility ?ADLs Comments: IND ?  ? ? ?Hand Dominance  ? Dominant Hand: Left ? ?  ?Extremity/Trunk Assessment  ? Upper Extremity Assessment ?Upper Extremity Assessment: Overall WFL for tasks assessed ?  ? ?Lower Extremity Assessment ?Lower Extremity Assessment: RLE deficits/detail;LLE deficits/detail ?RLE Deficits / Details: MMT ank df/pf 4+/5, no extensor lag noted ?RLE Sensation: WNL ?LLE Deficits / Details: MMT ank df/pf 4+/5 ?  ? ?Cervical / Trunk Assessment ?Cervical / Trunk Assessment: Normal  ?Communication  ? Communication: No difficulties  ?Cognition Arousal/Alertness: Awake/alert ?Behavior During Therapy: [facility] for tasks assessed/performed ?Overall Cognitive Status: Within Functional Limits for tasks assessed ?  ?  ?  ?  ?  ?  ?  ?  ?  ?  ?  ?  ?  ?  ?  ?  ?  ?  ?  ? ?  ?General Comments General comments (skin integrity, edema, etc.): Pt's SpO2 varied quite a bit during the session from 86%-94% so at the end of the session I resumed her Wade at 2L and her SpO2 was stable at 96%. ? ?  ?Exercises Total Joint Exercises ?Ankle Circles/Pumps: AROM, Both, 20 reps  ? ?Assessment/Plan  ?  ?PT Assessment Patient needs continued PT services  ?PT Problem List Decreased strength;Decreased range of motion;Decreased activity tolerance;Decreased balance;Decreased mobility;Decreased coordination;Pain ? ?   ?  ?PT Treatment  Interventions DME instruction;Gait training;Stair training;Functional mobility training;Therapeutic activities;Therapeutic exercise;Balance training;Neuromuscular re-education;Patient/family education   ? ?PT Goals (Current goals can be found in the Care Plan section)  ?Acute Rehab PT Goals ?Patient Stated Goal: Have less pain ?PT Goal Formulation: With patient ?Time For Goal Achievement: 01/03/22 ?Potential to Achieve Goals: Good ? ?  ?Frequency 7X/week ?  ? ? ?Co-evaluation   ?  ?  ?  ?  ? ? ?  ?AM-PAC PT "6 Clicks" Mobility  ?Outcome Measure Help needed turning from your back to your side while in a flat bed without using bedrails?: A Little ?Help needed moving from lying on your back to sitting on the side of a flat bed without using bedrails?: A Little ?Help needed moving to and from a bed to a chair (including a wheelchair)?: A Little ?Help needed standing up from a chair using your arms (e.g., wheelchair or bedside chair)?: A Little ?Help needed to walk in hospital room?: A Little ?Help needed climbing 3-5 steps with a railing? : A Little ?6 Click Score: 18 ? ?  ?End of Session Equipment Utilized During Treatment: Gait belt ?Activity Tolerance: Other (comment) (Treatment limited by nausea) ?Patient left: in chair;with call bell/phone within reach;with chair alarm set;with SCD's reapplied ?Nurse Communication: Mobility status;Other (comment) (Oxygen, pt requesting zofran) ?PT Visit Diagnosis: Pain;Difficulty in walking, not elsewhere classified (R26.2) ?Pain - Right/Left: Right ?  Pain - part of body: Knee ?  ? ?Time: 4401-0272 ?PT Time Calculation (min) (ACUTE ONLY): 37 min ? ? ?Charges:   PT Evaluation ?$PT Eval Low Complexity: 1 Low ?PT Treatments ?$Therapeutic Activity: 8-22 mins ?  ?   ? ? ?Jamesetta Geralds, PT, DPT ?[facility] Department ?Office: [phone] ?Pager: [phone] ? ?Jamesetta Geralds ?12/27/2021, 4:24 PM ? ?

## 2021-12-27 NOTE — Transfer of Care (Signed)
Immediate Anesthesia Transfer of Care Note ? ?Patient: Kaitlyn [name] ? ?Procedure(s) Performed: RIGHT TOTAL KNEE ARTHROPLASTY (Right: Knee) ? ?Patient Location: PACU ? ?Anesthesia Type:Regional and Spinal ? ?Level of Consciousness: awake, alert  and oriented ? ?Airway & Oxygen Therapy: Patient Spontanous Breathing and Patient connected to face mask oxygen ? ?Post-op Assessment: Report given to RN and Post -op Vital signs reviewed and stable ? ?Post vital signs: Reviewed and stable ? ?Last Vitals:  ?Vitals Value Taken Time  ?BP 112/71 12/27/21 0956  ?Temp    ?Pulse 87 12/27/21 0958  ?Resp 12 12/27/21 0958  ?SpO2 97 % 12/27/21 0958  ?Vitals shown include unvalidated device data. ? ?Last Pain:  ?Vitals:  ? 12/27/21 0619  ?TempSrc: Oral  ?   ? ?  ? ?Complications: No notable events documented. ?

## 2021-12-27 NOTE — Op Note (Signed)
NAME: [name], [name] ?MEDICAL RECORD NO: [id number] ?ACCOUNT NO: [id number] ?DATE OF BIRTH: November 03, 1955 ?FACILITY: [facility] ?LOCATION: [facility] ?PHYSICIAN: Vanita Panda. Magnus Ivan, MD ? ?Operative Report  ? ?DATE OF PROCEDURE: 12/27/2021 ? ?PREOPERATIVE DIAGNOSIS:  Primary osteoarthritis and degenerative joint disease, right knee. ? ?POSTOPERATIVE DIAGNOSIS:  Primary osteoarthritis and degenerative joint disease, right knee. ? ?PROCEDURE:  Right total knee arthroplasty. ? ?IMPLANTS:  Stryker Triathlon cemented knee system with size 3 femur, size 3 tibial tray, 11 mm fixed bearing polyethylene insert, size 29 mm patellar button. ? ?SURGEON: Vanita Panda. Magnus Ivan, MD ? ?ASSISTANT:  Richardean Canal, PA-C ? ?ANESTHESIA:   ?1.  Right lower extremity adductor canal block. ?2.  Spinal. ? ?TOURNIQUET TIME:  Under 1 hour. ? ?BLOOD LOSS:  Less than 100 mL ? ?ANTIBIOTICS:  2 g IV Ancef. ? ?COMPLICATIONS:  None. ? ?INDICATIONS:  The patient is a 66 year old female with rheumatoid disease, is well known to [place].  She has a history of bilateral shoulder replacements, bilateral hip replacements and a left knee replacement.  Her right knee needs replacing.  She has  ?severe rheumatoid and osteoarthritis of the right knee with bone-on-bone wear.  Her pain is daily and is detrimentally affecting her mobility, her quality of life and her activities of daily living.  Having had successful total joints before, she is  ?completely aware of the risk of acute blood loss anemia, nerve or vessel injury, fracture, infection, DVT, implant failure, skin and soft tissue issues.  She understands our goals are to decrease pain, improve mobility and overall improve quality of  ?life. ? ?DESCRIPTION OF PROCEDURE:  After informed consent was obtained, appropriate right knee was marked and adductor canal block was obtained in the right lower extremity in holding room.  She was then brought to the operating room and sat up on the operating  ?table.  Spinal  anesthesia was obtained.  She was laid in supine position on the operating table.  Foley catheter was placed and a nonsterile tourniquet was placed around her upper right thigh.  Her right thigh, knee, leg, and foot were prepped and draped ? with DuraPrep and sterile drapes including a sterile stockinette.  A timeout was called and she was identified as correct patient, correct right knee.  We then used Esmarch to wrap that leg and tourniquet was inflated to 300 mm of pressure.  I then made ? a direct midline incision over the patella and carried this proximally and distally.  We dissected down the knee joint, carried out a medial parapatellar arthrotomy, finding a moderate joint effusion.  With the knee in a flexed position, we removed  ?osteophytes in all three compartments as well as remnants of ACL, PCL, medial and lateral meniscus.  We then used our extramedullary cutting guide for making our proximal tibia cut, taking 9 mm off the high side and correcting the varus and valgus in  ?neutral slope.  We made this cut without difficulty and it filmed like a thin cut, so we backed it down 2 more millimeters.  We then used an intramedullary guide for our distal femur cut, setting this for a right knee at 5 degrees externally rotated and  ?10 mm distal femoral cut due to a slight flexion contracture.  We made that cut without difficulty, and brought the knee back down to full extension and with a 9 mm extension block achieved full extension.  We then went back to the femur and put our  ?femoral sizing guide based  off the epicondylar axis.  Based off of that, we chose a size 3 femur, which I corresponded with her other left total knee.  We put a 4-in-1 cutting block for a size 3 femur, made our anterior and posterior cuts, followed by  ?our chamfer cuts.  We then made our femoral box cut.  Attention was then turned back to the tibia.  We chose a size 3 tibial tray for coverage on the tibial plateau, setting the  rotation off the tibial tubercle and the femur.  We made our keel punch off  ?of this.  We then placed a trial 3 tibia followed by a trial 3 right femur.  We trialed a 9 and then 11 mm fixed bearing polyethylene insert and we were pleased with the 11 mm insert in terms of stability and range of motion.  We then made our patellar  ?cut and drilled 3 holes for size 29 patellar button.  We then put the knee through several cycles of motion with all trial implants in place.  We then removed all trial implants from the knee and irrigated the knee with normal saline solution.  We dried  ?the knee real well and mixed our cement.  With the knee in a flexed position, we then cemented our real Stryker Triathlon tibial tray size 3, followed by cementing our real size 3 right femur.  We placed our real 11 mm fixed bearing polyethylene insert  ?and cemented our size 29 patellar button.  We held the knee fully extended and compressed to allow the cement to harden.  We removed excess cement debris from the knee as well.  Once the cement had hardened, we let the tourniquet down and hemostasis was  ?obtained with electrocautery.  We then closed the arthrotomy with interrupted #1 Vicryl suture followed by 0 Vicryl to close the deep tissue and 2-0 Vicryl to close subcutaneous tissue.  The skin was closed with staples.  A well-padded sterile dressing  ?was applied.  She was taken to recovery room in stable condition with all final counts being correct.  No complications noted.  Of note, Richardean Canal, PA-C, assisted in the entirety of the case from beginning to the end.  His assistance was crucial for  ?managing soft tissues, retracting soft tissues as well as helping guide implant placement.  He was involved in direct layered closure of the wound.  His assistance was medically necessary. ? ? ?[facility] ?D: 12/27/2021 9:37:59 am T: 12/27/2021 11:17:00 pm  ?JOB: [id number]/ [id number]  ?

## 2021-12-27 NOTE — Interval H&P Note (Signed)
History and Physical Interval Note: The patient understands that she is here today for a right knee replacement to treat her right knee osteoarthritis.  There has been no acute or interval change in her medical status.  Please see H&P.  The risks and benefits of surgery been discussed in detail and informed consent is obtained.  The right operative knee has been marked. ? ?12/27/2021 ?7:02 AM ? ?Kaitlyn [name]  has presented today for surgery, with the diagnosis of RIGHT KNEE OSTEOARTHRITIS/ DEGENERATIVE JOINT DIEASE.  The various methods of treatment have been discussed with the patient and family. After consideration of risks, benefits and other options for treatment, the patient has consented to  Procedure(s): ?RIGHT TOTAL KNEE ARTHROPLASTY (Right) as a surgical intervention.  The patient's history has been reviewed, patient examined, no change in status, stable for surgery.  I have reviewed the patient's chart and labs.  Questions were answered to the patient's satisfaction.   ? ? ?Kathryne Hitch ? ? ?

## 2021-12-27 NOTE — Anesthesia Procedure Notes (Signed)
Spinal ? ?Patient location during procedure: OR ?End time: 12/27/2021 8:25 AM ?Reason for block: surgical anesthesia ?Staffing ?Performed: resident/CRNA  ?Resident/CRNA: Atilano Median, CRNA ?Preanesthetic Checklist ?Completed: patient identified, IV checked, site marked, risks and benefits discussed, surgical consent, monitors and equipment checked, pre-op evaluation and timeout performed ?Spinal Block ?Patient position: sitting ?Prep: DuraPrep ?Patient monitoring: heart rate, continuous pulse ox and blood pressure ?Approach: midline ?Location: L3-4 ?Injection technique: single-shot ?Needle ?Needle type: Spinocan  ?Needle gauge: 24 G ?Needle length: 9 cm ?Assessment ?Sensory level: T6 ?Events: CSF return ? ? ? ?

## 2021-12-27 NOTE — Plan of Care (Signed)
?  Problem: Activity: ?Goal: Risk for activity intolerance will decrease ?Outcome: Progressing ?  ?Problem: Pain Managment: ?Goal: General experience of comfort will improve ?12/27/2021 2205 by Abagail Kitchens, RN ?Outcome: Progressing ?12/27/2021 2205 by Abagail Kitchens, RN ?Outcome: Progressing ?  ?Problem: Safety: ?Goal: Ability to remain free from injury will improve ?12/27/2021 2205 by Abagail Kitchens, RN ?Outcome: Progressing ?12/27/2021 2205 by Abagail Kitchens, RN ?Outcome: Progressing ?  ?

## 2021-12-28 DIAGNOSIS — M1711 Unilateral primary osteoarthritis, right knee: Secondary | ICD-10-CM | POA: Diagnosis not present

## 2021-12-28 LAB — CBC
HCT: 35.4 % — ABNORMAL LOW (ref 36.0–46.0)
Hemoglobin: 11.1 g/dL — ABNORMAL LOW (ref 12.0–15.0)
MCH: 32.9 pg (ref 26.0–34.0)
MCHC: 31.4 g/dL (ref 30.0–36.0)
MCV: 105 fL — ABNORMAL HIGH (ref 80.0–100.0)
Platelets: 258 10*3/uL (ref 150–400)
RBC: 3.37 MIL/uL — ABNORMAL LOW (ref 3.87–5.11)
RDW: 13 % (ref 11.5–15.5)
WBC: 14.3 10*3/uL — ABNORMAL HIGH (ref 4.0–10.5)
nRBC: 0 % (ref 0.0–0.2)

## 2021-12-28 LAB — BASIC METABOLIC PANEL
Anion gap: 7 (ref 5–15)
BUN: 21 mg/dL (ref 8–23)
CO2: 27 mmol/L (ref 22–32)
Calcium: 9.4 mg/dL (ref 8.9–10.3)
Chloride: 108 mmol/L (ref 98–111)
Creatinine, Ser: 1.09 mg/dL — ABNORMAL HIGH (ref 0.44–1.00)
GFR, Estimated: 56 mL/min — ABNORMAL LOW (ref >60)
Glucose, Bld: 143 mg/dL — ABNORMAL HIGH (ref 70–99)
Potassium: 5.3 mmol/L — ABNORMAL HIGH (ref 3.5–5.1)
Sodium: 142 mmol/L (ref 135–145)

## 2021-12-28 MED ORDER — ASPIRIN 81 MG PO CHEW: 81.0000 mg | CHEWABLE_TABLET | Freq: Two times a day (BID) | ORAL | 1 refills | Status: DC

## 2021-12-28 MED ORDER — OXYCODONE HCL 5 MG PO TABS: 5.0000 mg | ORAL_TABLET | ORAL | 0 refills | Status: DC | PRN

## 2021-12-28 MED ORDER — METHOCARBAMOL 500 MG PO TABS: 500.0000 mg | ORAL_TABLET | Freq: Four times a day (QID) | ORAL | 1 refills | Status: DC | PRN

## 2021-12-28 NOTE — Progress Notes (Signed)
Physical Therapy Treatment ?Patient Details ?Name: Kaitlyn [name] ?MRN: [id number] ?DOB: 1956-08-03 ?Today's Date: 12/28/2021 ? ? ?History of Present Illness Pt is a 66yo female presenting s/p R-TKA on 12/27/21. PMH: anxiety and depression, OA, GERD, HA, HTN, hyperlipidemia, hypothyroidism, L-TKA 2021, B/L total shoulders 2019 ? ?  ?PT Comments  ? ? Pt making excellent progress. Reviewed knee precautions, stair training. Meeting goals. Pt reports her HHPT is set up to start  tomorrow.  Pt ready to d/c with family assist prn from PT standpoint   ?Recommendations for follow up therapy are one component of a multi-disciplinary discharge planning process, led by the attending physician.  Recommendations may be updated based on patient status, additional functional criteria and insurance authorization. ? ?Follow Up Recommendations ? Follow physician's recommendations for discharge plan and follow up therapies ?  ?  ?Assistance Recommended at Discharge Intermittent Supervision/Assistance  ?Patient can return home with the following A little help with walking and/or transfers;A little help with bathing/dressing/bathroom;Assistance with cooking/housework;Assist for transportation;Help with stairs or ramp for entrance ?  ?Equipment Recommendations ? Rolling walker (2 wheels)  ?  ?Recommendations for Other Services   ? ? ?  ?Precautions / Restrictions Precautions ?Precautions: Knee;Fall ?Precaution Booklet Issued: No ?Restrictions ?Weight Bearing Restrictions: No ?Other Position/Activity Restrictions: WBAT  ?  ? ?Mobility ? Bed Mobility ?Overal bed mobility: Needs Assistance ?Bed Mobility: Supine to Sit, Sit to Supine ?  ?  ?Supine to sit: Supervision ?Sit to supine: Supervision ?  ?General bed mobility comments: no physical assist, incr time ?  ? ?Transfers ?Overall transfer level: Needs assistance ?Equipment used: Rolling walker (2 wheels) ?Transfers: Sit to/from Stand ?Sit to Stand: Min guard, Supervision ?  ?  ?  ?  ?  ?General  transfer comment: cues for hand placement and RLE position ?  ? ?Ambulation/Gait ?Ambulation/Gait assistance: Supervision ?Gait Distance (Feet): 120 Feet ?Assistive device: Rolling walker (2 wheels) ?Gait Pattern/deviations: Step-to pattern, Decreased stance time - right ?  ?  ?  ?General Gait Details: verbal cues for sequence and RW position; improved wt shift to RLE, no LOB ? ? ?Stairs ?Stairs: Yes ?Stairs assistance: Min guard ?Stair Management: One rail Right, One rail Left, Step to pattern, Sideways ?Number of Stairs: 5 (x2) ?General stair comments: verbal cues for sequence and step to technique. good stability, no LOB, no knee buckling noted ? ? ?Wheelchair Mobility ?  ? ?Modified Rankin (Stroke Patients Only) ?  ? ? ?  ?Balance   ?  ?Sitting balance-Leahy Scale: Fair ?  ?  ?Standing balance support: Bilateral upper extremity supported, During functional activity, Reliant on assistive device for balance ?Standing balance-Leahy Scale: Poor ?  ?  ?  ?  ?  ?  ?  ?  ?  ?  ?  ?  ?  ? ?  ?Cognition Arousal/Alertness: Awake/alert ?Behavior During Therapy: [facility] for tasks assessed/performed ?Overall Cognitive Status: Within Functional Limits for tasks assessed ?  ?  ?  ?  ?  ?  ?  ?  ?  ?  ?  ?  ?  ?  ?  ?  ?  ?  ?  ? ?  ?Exercises Total Joint Exercises ? ?  ?General Comments   ?  ?  ? ?Pertinent Vitals/Pain Pain Assessment ?Pain Assessment: 0-10 ?Pain Score: 5  ?Pain Location: R knee ?Pain Descriptors / Indicators: Operative site guarding, Discomfort ?Pain Intervention(s): Limited activity within patient's tolerance, Monitored during session, Premedicated before session, Repositioned  ? ? ?  Home Living   ?  ?  ?  ?  ?  ?  ?  ?  ?  ?   ?  ?Prior Function    ?  ?  ?   ? ?PT Goals (current goals can now be found in the care plan section) Acute Rehab PT Goals ?Patient Stated Goal: Have less pain ?PT Goal Formulation: With patient ?Time For Goal Achievement: 01/03/22 ?Potential to Achieve Goals: Good ?Progress towards PT  goals: Progressing toward goals ? ?  ?Frequency ? ? ? 7X/week ? ? ? ?  ?PT Plan Current plan remains appropriate  ? ? ?Co-evaluation   ?  ?  ?  ?  ? ?  ?AM-PAC PT "6 Clicks" Mobility   ?Outcome Measure ? Help needed turning from your back to your side while in a flat bed without using bedrails?: A Little ?Help needed moving from lying on your back to sitting on the side of a flat bed without using bedrails?: A Little ?Help needed moving to and from a bed to a chair (including a wheelchair)?: A Little ?Help needed standing up from a chair using your arms (e.g., wheelchair or bedside chair)?: A Little ?Help needed to walk in hospital room?: A Little ?Help needed climbing 3-5 steps with a railing? : A Little ?6 Click Score: 18 ? ?  ?End of Session Equipment Utilized During Treatment: Gait belt ?Activity Tolerance: Patient tolerated treatment well ?Patient left: with call bell/phone within reach;with family/visitor present;in bed;with bed alarm set ?Nurse Communication: Mobility status ?PT Visit Diagnosis: Pain;Difficulty in walking, not elsewhere classified (R26.2) ?Pain - Right/Left: Right ?Pain - part of body: Knee ?  ? ? ?Time: 8921-1941 ?PT Time Calculation (min) (ACUTE ONLY): 16 min ? ?Charges:  $Gait Training: 8-22 mins          ?          ? Delice Bison, PT ? ?Acute Rehab Dept [facility]) [phone] ?Pager [phone] ? ?12/28/2021 ? ? ? ?Annet Manukyan ?12/28/2021, 1:56 PM ? ?

## 2021-12-28 NOTE — Progress Notes (Signed)
Subjective: ?1 Day Post-Op Procedure(s) (LRB): ?RIGHT TOTAL KNEE ARTHROPLASTY (Right) ?Patient reports pain as moderate.   ? ?Objective: ?Vital signs in last 24 hours: ?Temp:  [97.5 ?F (36.4 ?C)-98.4 ?F (36.9 ?C)] 98.4 ?F (36.9 ?C) (04/15 0434) ?Pulse Rate:  [86-102] 97 (04/15 0434) ?Resp:  [10-23] 16 (04/15 0434) ?BP: (100-154)/(69-91) 128/91 (04/15 0434) ?SpO2:  [94 %-99 %] 95 % (04/15 0748) ?Weight:  [65.7 kg] 65.7 kg (04/14 1300) ? ?Intake/Output from previous day: ?04/14 0701 - 04/15 0700 ?In: 3091.9 [P.O.:120; I.V.:2921.9; IV Piggyback:50] ?Out: 2400 [Urine:2350; Blood:50] ?Intake/Output this shift: ?Total I/O ?In: 671.8 [P.O.:240; I.V.:431.8] ?Out: -  ? ?Recent Labs  ?  12/28/21 ?0342  ?HGB 11.1*  ? ?Recent Labs  ?  12/28/21 ?0342  ?WBC 14.3*  ?RBC 3.37*  ?HCT 35.4*  ?PLT 258  ? ?Recent Labs  ?  12/27/21 ?0640 12/28/21 ?0342  ?NA 139 142  ?K 3.4* 5.3*  ?CL 106 108  ?CO2 23 27  ?BUN 21 21  ?CREATININE 1.13* 1.09*  ?GLUCOSE 167* 143*  ?CALCIUM 9.0 9.4  ? ?No results for input(s): LABPT, INR in the last 72 hours. ? ?Sensation intact distally ?Intact pulses distally ?Dorsiflexion/Plantar flexion intact ?Incision: dressing C/D/I ?Compartment soft ? ? ?Assessment/Plan: ?1 Day Post-Op Procedure(s) (LRB): ?RIGHT TOTAL KNEE ARTHROPLASTY (Right) ?Up with therapy ?Discharge home with home health ? ? ? ? ? ?Kathryne Hitch ?12/28/2021, 9:41 AM ? ?

## 2021-12-28 NOTE — Plan of Care (Signed)
  Problem: Clinical Measurements: Goal: Respiratory complications will improve Outcome: Progressing   Problem: Clinical Measurements: Goal: Cardiovascular complication will be avoided Outcome: Progressing   Problem: Coping: Goal: Level of anxiety will decrease Outcome: Progressing   Problem: Pain Managment: Goal: General experience of comfort will improve Outcome: Progressing   

## 2021-12-28 NOTE — Plan of Care (Signed)
Pt to d/c home with family.  

## 2021-12-28 NOTE — Progress Notes (Signed)
Pt stable at time of d/c instructions. No needs at time of d/c instructions and education. PT needed no DME. Pt dressing clean dry and intact at time of discharge.  ?

## 2021-12-28 NOTE — TOC Transition Note (Signed)
Transition of Care (TOC) - CM/SW Discharge Note ? ? ?Patient Details  ?Name: Kaitlyn [name] ?MRN: [id number] ?Date of Birth: 01-Apr-1956 ? ?Transition of Care (TOC) CM/SW Contact:  ?Tawanna Cooler, RN ?Phone Number: ?12/28/2021, 10:05 AM ? ? ?Clinical Narrative:    ? ?Spoke with patient at bedside.  States she already has a RW and 3-in-1.   ?Ortho office has already arranged [facility] for patient.  No TOC needs.  ? ? ?Final next level of care: [place] ?Barriers to Discharge: No Barriers Identified ? ? ?Discharge Plan and Services ? ?DME Agency: NA ?   ?HH Arranged: PT ?[facility] Agency: [facility] ?  ?  ?Representative spoke with at [facility]: [place] arranged by Ortho office ?

## 2021-12-28 NOTE — Discharge Summary (Signed)
Patient ID: ?Kaitlyn [name] ?MRN: [id number] ?DOB/AGE: [birth year] 66 y.o. ? ?Admit date: 12/27/2021 ?Discharge date: 12/28/2021 ? ?Admission Diagnoses:  ?Principal Problem: ?  Unilateral primary osteoarthritis, right knee ?Active Problems: ?  Status post right knee replacement ? ? ?Discharge Diagnoses:  ?Same ? ?Past Medical History:  ?Diagnosis Date  ? Anemia   ? Anxiety   ? Arthritis   ? oa  ? COVID 06/2020  ? asymptomatic, took monoclonal antibodies  ? Depression   ? Frequency of urination   ? GERD (gastroesophageal reflux disease)   ? CONTROLLED W/ PRILOSEC  ? Headache   ? hx of migraines  ? History of recent blood transfusion 2018  ? Hyperlipemia   ? Hypertension   ? Hypothyroidism   ? Incontinence of urine   ? Insomnia   ? Interstitial cystitis   ? Pelvic pain syndrome I.C.  ? PONV (postoperative nausea and vomiting) yrs ago  ? Urgency of urination   ? Wears dentures   ? upper  ? Wears glasses   ? ? ?Surgeries: Procedure(s): ?RIGHT TOTAL KNEE ARTHROPLASTY on 12/27/2021 ?  ?Consultants:  ? ?Discharged Condition: Improved ? ?Hospital Course: Kaitlyn [name] is an 66 y.o. female who was admitted 12/27/2021 for operative treatment ofUnilateral primary osteoarthritis, right knee. Patient has severe unremitting pain that affects sleep, daily activities, and work/hobbies. After pre-op clearance the patient was taken to the operating room on 12/27/2021 and underwent  Procedure(s): ?RIGHT TOTAL KNEE ARTHROPLASTY.   ? ?Patient was given perioperative antibiotics:  ?Anti-infectives (From admission, onward)  ? ? Start     Dose/Rate Route Frequency Ordered Stop  ? 12/27/21 1500  ceFAZolin (ANCEF) IVPB 1 g/50 mL premix       ? 1 g ?100 mL/hr over 30 Minutes Intravenous Every 6 hours 12/27/21 1301 12/28/21 1304  ? 12/27/21 0615  ceFAZolin (ANCEF) IVPB 2g/100 mL premix       ? 2 g ?200 mL/hr over 30 Minutes Intravenous On call to O.R. 12/27/21 0600 12/27/21 0827  ? ?  ?  ? ?Patient was given sequential compression devices, early  ambulation, and chemoprophylaxis to prevent DVT. ? ?Patient benefited maximally from hospital stay and there were no complications.   ? ?Recent vital signs: Patient Vitals for the past 24 hrs: ? BP Temp Temp src Pulse Resp SpO2  ?12/28/21 1406 (!) 156/75 98.2 ?F (36.8 ?C) -- (!) 107 18 91 %  ?12/28/21 1004 120/67 98.1 ?F (36.7 ?C) -- 96 17 92 %  ?12/28/21 0748 -- -- -- -- -- 95 %  ?12/28/21 0434 (!) 128/91 98.4 ?F (36.9 ?C) Oral 97 16 94 %  ?12/28/21 0117 131/84 98.1 ?F (36.7 ?C) Oral 99 14 94 %  ?12/27/21 2129 (!) 154/86 98 ?F (36.7 ?C) Oral (!) 102 16 98 %  ?  ? ?Recent laboratory studies:  ?Recent Labs  ?  12/27/21 ?0640 12/28/21 ?0342  ?WBC  --  14.3*  ?HGB  --  11.1*  ?HCT  --  35.4*  ?PLT  --  258  ?NA 139 142  ?K 3.4* 5.3*  ?CL 106 108  ?CO2 23 27  ?BUN 21 21  ?CREATININE 1.13* 1.09*  ?GLUCOSE 167* 143*  ?CALCIUM 9.0 9.4  ? ? ? ?Discharge Medications:   ?Allergies as of 12/28/2021   ? ?   Reactions  ? Sulfa Antibiotics Rash  ? ?  ? ?  ?Medication List  ?  ? ?TAKE these medications   ? ?amLODipine 5 MG  tablet ?Commonly known as: NORVASC ?Take 5 mg by mouth daily. ?  ?aspirin 81 MG chewable tablet ?Chew 1 tablet (81 mg total) by mouth 2 (two) times daily. ?  ?divalproex 500 MG DR tablet ?Commonly known as: DEPAKOTE ?Take 500 mg by mouth daily. ?  ?escitalopram 20 MG tablet ?Commonly known as: LEXAPRO ?Take 20 mg by mouth at bedtime. ?  ?gabapentin 300 MG capsule ?Commonly known as: NEURONTIN ?Take 300 mg by mouth 2 (two) times daily. ?  ?levothyroxine 100 MCG tablet ?Commonly known as: SYNTHROID ?Take 100 mcg by mouth daily before breakfast. ?  ?LORazepam 0.5 MG tablet ?Commonly known as: ATIVAN ?Take 0.5 mg by mouth daily as needed for anxiety. ?  ?Melatonin 10 MG Tabs ?Take 10 mg by mouth at bedtime. ?  ?methocarbamol 500 MG tablet ?Commonly known as: ROBAXIN ?Take 1 tablet (500 mg total) by mouth every 6 (six) hours as needed for muscle spasms. ?  ?multivitamin-prenatal 27-0.8 MG Tabs tablet ?Take 1 tablet by  mouth daily. ?  ?ondansetron 4 MG disintegrating tablet ?Commonly known as: Zofran ODT ?Take 1 tablet (4 mg total) by mouth every 8 (eight) hours as needed for nausea or vomiting. ?  ?oxyCODONE 5 MG immediate release tablet ?Commonly known as: Oxy IR/ROXICODONE ?Take 1-2 tablets (5-10 mg total) by mouth every 4 (four) hours as needed for moderate pain (pain score 4-6). ?  ?pantoprazole 40 MG tablet ?Commonly known as: PROTONIX ?Take 40 mg by mouth 2 (two) times daily. ?  ?promethazine 25 MG tablet ?Commonly known as: PHENERGAN ?Take 25 mg by mouth every 6 (six) hours as needed for vomiting or nausea. ?  ?QUEtiapine 200 MG tablet ?Commonly known as: SEROQUEL ?Take 200 mg by mouth at bedtime. ?  ?rizatriptan 10 MG tablet ?Commonly known as: MAXALT ?Take 10 mg by mouth as needed for migraine. ?  ? ?  ? ?  ?  ? ? ?  ?Durable Medical Equipment  ?(From admission, onward)  ?  ? ? ?  ? ?  Start     Ordered  ? 12/27/21 1302  DME 3 n 1  Once       ? 12/27/21 1301  ? 12/27/21 1302  DME Walker rolling  Once       ?Question Answer Comment  ?Walker: With 5 Inch Wheels   ?Patient needs a walker to treat with the following condition Status post total right knee replacement   ?  ? 12/27/21 1301  ? ?  ?  ? ?  ? ? ?Diagnostic Studies: DG Knee Right Port ? ?Result Date: 12/27/2021 ?CLINICAL DATA:  Status post right knee replacement. EXAM: PORTABLE RIGHT KNEE - 1-2 VIEW COMPARISON:  None. FINDINGS: Status post right knee arthroplasty with intact hardware. No periprosthetic fracture. Subcutaneous emphysema and surgical clips as expected. IMPRESSION: Status post right knee arthroplasty without acute complications. Electronically Signed   By: Larose Hires D.O.   On: 12/27/2021 10:27   ? ?Disposition: Discharge disposition: 01-Home or Self Care ? ? ? ? ? ? ? ? ? Follow-up Information   ? ? Kathryne Hitch, MD Follow up in 2 week(s).   ?Specialty: Orthopedic Surgery ?Contact information: ?[address] ?[place]  [address] ?[phone] ? ? ?  ?  ? ?  ?  ? ?  ? ? ? ?Signed: ?Kathryne Hitch ?12/28/2021, 3:52 PM ? ?  ?

## 2021-12-28 NOTE — Progress Notes (Signed)
Physical Therapy Treatment ?Patient Details ?Name: Kaitlyn [name] ?MRN: [id number] ?DOB: 1956-05-15 ?Today's Date: 12/28/2021 ? ? ?History of Present Illness Pt is a 66yo female presenting s/p R-TKA on 12/27/21. PMH: anxiety and depression, OA, GERD, HA, HTN, hyperlipidemia, hypothyroidism, L-TKA 2021, B/L total shoulders 2019 ? ?  ?PT Comments  ? ? Pt is progressing well today. Feel pt will be able to d/c after second session today    ?Recommendations for follow up therapy are one component of a multi-disciplinary discharge planning process, led by the attending physician.  Recommendations may be updated based on patient status, additional functional criteria and insurance authorization. ? ?Follow Up Recommendations ? Follow physician's recommendations for discharge plan and follow up therapies ?  ?  ?Assistance Recommended at Discharge Intermittent Supervision/Assistance  ?Patient can return home with the following A little help with walking and/or transfers;A little help with bathing/dressing/bathroom;Assistance with cooking/housework;Assist for transportation;Help with stairs or ramp for entrance ?  ?Equipment Recommendations ? Rolling walker (2 wheels)  ?  ?Recommendations for Other Services   ? ? ?  ?Precautions / Restrictions Precautions ?Precautions: Knee;Fall ?Precaution Booklet Issued: No ?Restrictions ?Weight Bearing Restrictions: No ?Other Position/Activity Restrictions: WBAT  ?  ? ?Mobility ? Bed Mobility ?Overal bed mobility: Needs Assistance ?Bed Mobility: Supine to Sit ?  ?  ?Supine to sit: Min assist ?  ?  ?General bed mobility comments: assist to lower RLE ?  ? ?Transfers ?Overall transfer level: Needs assistance ?Equipment used: Rolling walker (2 wheels) ?Transfers: Sit to/from Stand ?Sit to Stand: Min guard ?  ?  ?  ?  ?  ?General transfer comment: cues for hand placement and RLE position ?  ? ?Ambulation/Gait ?Ambulation/Gait assistance: Min guard, Supervision ?Gait Distance (Feet): 100  Feet ?Assistive device: Rolling walker (2 wheels) ?Gait Pattern/deviations: Step-to pattern, Decreased stance time - right ?  ?  ?  ?General Gait Details: verbal cues for sequence and RW position ? ? ?Stairs ?  ?  ?  ?  ?  ? ? ?Wheelchair Mobility ?  ? ?Modified Rankin (Stroke Patients Only) ?  ? ? ?  ?Balance   ?  ?Sitting balance-Leahy Scale: Fair ?  ?  ?Standing balance support: Bilateral upper extremity supported, During functional activity, Reliant on assistive device for balance ?Standing balance-Leahy Scale: Poor ?  ?  ?  ?  ?  ?  ?  ?  ?  ?  ?  ?  ?  ? ?  ?Cognition Arousal/Alertness: Awake/alert ?Behavior During Therapy: [facility] for tasks assessed/performed ?Overall Cognitive Status: Within Functional Limits for tasks assessed ?  ?  ?  ?  ?  ?  ?  ?  ?  ?  ?  ?  ?  ?  ?  ?  ?  ?  ?  ? ?  ?Exercises Total Joint Exercises ?Ankle Circles/Pumps: AROM, Both, 20 reps ?Quad Sets: AROM, Both, 10 reps ?Heel Slides: AAROM, AROM, Right, 10 reps ?Straight Leg Raises: AROM, AAROM, Strengthening, Right, 5 reps ? ?  ?General Comments   ?  ?  ? ?Pertinent Vitals/Pain Pain Assessment ?Pain Assessment: 0-10 ?Pain Score: 4  ?Pain Location: R knee ?Pain Descriptors / Indicators: Operative site guarding, Discomfort ?Pain Intervention(s): Limited activity within patient's tolerance, Monitored during session, Premedicated before session, Repositioned  ? ? ?Home Living   ?  ?  ?  ?  ?  ?  ?  ?  ?  ?   ?  ?Prior Function    ?  ?  ?   ? ?  PT Goals (current goals can now be found in the care plan section) Acute Rehab PT Goals ?Patient Stated Goal: Have less pain ?PT Goal Formulation: With patient ?Time For Goal Achievement: 01/03/22 ?Potential to Achieve Goals: Good ?Progress towards PT goals: Progressing toward goals ? ?  ?Frequency ? ? ? 7X/week ? ? ? ?  ?PT Plan Current plan remains appropriate  ? ? ?Co-evaluation   ?  ?  ?  ?  ? ?  ?AM-PAC PT "6 Clicks" Mobility   ?Outcome Measure ? Help needed turning from your back to your side  while in a flat bed without using bedrails?: A Little ?Help needed moving from lying on your back to sitting on the side of a flat bed without using bedrails?: A Little ?Help needed moving to and from a bed to a chair (including a wheelchair)?: A Little ?Help needed standing up from a chair using your arms (e.g., wheelchair or bedside chair)?: A Little ?Help needed to walk in hospital room?: A Little ?Help needed climbing 3-5 steps with a railing? : A Little ?6 Click Score: 18 ? ?  ?End of Session Equipment Utilized During Treatment: Gait belt ?Activity Tolerance: Patient tolerated treatment well ?Patient left: in chair;with call bell/phone within reach;with chair alarm set;with family/visitor present ?Nurse Communication: Mobility status ?PT Visit Diagnosis: Pain;Difficulty in walking, not elsewhere classified (R26.2) ?Pain - Right/Left: Right ?Pain - part of body: Knee ?  ? ? ?Time: 1004-1020 ?PT Time Calculation (min) (ACUTE ONLY): 16 min ? ?Charges:  $Gait Training: 8-22 mins          ?          ? Delice Bison, PT ? ?Acute Rehab Dept [facility]) [phone] ?Pager [phone] ? ?12/28/2021 ? ? ? ?Kaitlyn [name] ?12/28/2021, 12:49 PM ? ?

## 2021-12-28 NOTE — Discharge Instructions (Signed)

## 2021-12-30 ENCOUNTER — Encounter (HOSPITAL_COMMUNITY): Payer: Self-pay | Admitting: Orthopaedic Surgery

## 2021-12-31 ENCOUNTER — Telehealth: Payer: Self-pay | Admitting: Orthopaedic Surgery

## 2021-12-31 ENCOUNTER — Other Ambulatory Visit: Payer: Self-pay | Admitting: Orthopaedic Surgery

## 2021-12-31 MED ORDER — OXYCODONE HCL 5 MG PO TABS: 5.0000 mg | ORAL_TABLET | ORAL | 0 refills | Status: DC | PRN

## 2021-12-31 NOTE — Telephone Encounter (Signed)
Patient called needing Rx refilled Oxycodone. The number to contact patient is [phone] 

## 2022-01-03 ENCOUNTER — Other Ambulatory Visit: Payer: Self-pay | Admitting: Orthopaedic Surgery

## 2022-01-03 ENCOUNTER — Telehealth: Payer: Self-pay | Admitting: Orthopaedic Surgery

## 2022-01-03 MED ORDER — OXYCODONE HCL 5 MG PO TABS: 5.0000 mg | ORAL_TABLET | ORAL | 0 refills | Status: DC | PRN

## 2022-01-03 NOTE — Telephone Encounter (Signed)
Please advise 

## 2022-01-03 NOTE — Telephone Encounter (Signed)
Pt needs refill on oxycodone.  °

## 2022-01-09 ENCOUNTER — Encounter: Payer: Self-pay | Admitting: Orthopaedic Surgery

## 2022-01-09 ENCOUNTER — Ambulatory Visit (INDEPENDENT_AMBULATORY_CARE_PROVIDER_SITE_OTHER): Payer: Medicare HMO | Admitting: Orthopaedic Surgery

## 2022-01-09 DIAGNOSIS — Z96651 Presence of right artificial knee joint: Secondary | ICD-10-CM

## 2022-01-09 MED ORDER — OXYCODONE HCL 5 MG PO TABS: 5.0000 mg | ORAL_TABLET | ORAL | 0 refills | Status: DC | PRN

## 2022-01-09 NOTE — Progress Notes (Signed)
The patient is a 66 year old female who is 2 weeks tomorrow status post a right total knee arthroplasty.  She is able with a cane and doing well overall.  She is really sore needs a refill of oxycodone.  She does have a history of both her shoulders replaced as well as both hip replaced and her left knee replaced.  She is ready to transition outpatient therapy as well. ? ?On exam her staples are removed from the right knee and Steri-Strips applied.  Her incision looks good.  She lacks full extension by about 5 degrees and flexes to just past 90 degrees.  Her calf is soft. ? ?She will stop her aspirin at this standpoint.  I sent in some more oxycodone for her and gave her a physical therapy prescription for outpatient physical therapy with [facility].  I will see her back in 4 weeks to see how she is doing overall but no x-rays are needed.  When she does pain medications she knows to call us for refills. ?

## 2022-01-10 ENCOUNTER — Telehealth: Payer: Self-pay

## 2022-01-10 NOTE — Telephone Encounter (Signed)
Lvm informing 

## 2022-01-10 NOTE — Telephone Encounter (Signed)
Pt called and states she would like to know how long she is supposed to wear her compression hose. ?

## 2022-01-13 ENCOUNTER — Other Ambulatory Visit: Payer: Self-pay | Admitting: Orthopaedic Surgery

## 2022-01-13 ENCOUNTER — Telehealth: Payer: Self-pay | Admitting: Orthopaedic Surgery

## 2022-01-13 MED ORDER — OXYCODONE HCL 5 MG PO TABS: 5.0000 mg | ORAL_TABLET | ORAL | 0 refills | Status: DC | PRN

## 2022-01-13 NOTE — Telephone Encounter (Signed)
Pt would like refill on oxycodone

## 2022-01-13 NOTE — Telephone Encounter (Signed)
Please advise 

## 2022-01-16 ENCOUNTER — Telehealth: Payer: Self-pay | Admitting: Orthopaedic Surgery

## 2022-01-16 ENCOUNTER — Other Ambulatory Visit: Payer: Self-pay | Admitting: Orthopaedic Surgery

## 2022-01-16 MED ORDER — OXYCODONE HCL 5 MG PO TABS: 5.0000 mg | ORAL_TABLET | ORAL | 0 refills | Status: DC | PRN

## 2022-01-16 NOTE — Telephone Encounter (Signed)
Please call in oxycodone for the pt  ?

## 2022-01-20 ENCOUNTER — Telehealth: Payer: Self-pay | Admitting: Orthopaedic Surgery

## 2022-01-20 ENCOUNTER — Other Ambulatory Visit: Payer: Self-pay | Admitting: Orthopaedic Surgery

## 2022-01-20 MED ORDER — METHOCARBAMOL 500 MG PO TABS: 500.0000 mg | ORAL_TABLET | Freq: Four times a day (QID) | ORAL | 1 refills | Status: DC | PRN

## 2022-01-20 MED ORDER — OXYCODONE HCL 5 MG PO TABS: 5.0000 mg | ORAL_TABLET | ORAL | 0 refills | Status: DC | PRN

## 2022-01-20 NOTE — Telephone Encounter (Signed)
Patient called needing Rx refilled Oxycodone and Methocarbamol. The number to contact patient is [phone] ?

## 2022-01-24 ENCOUNTER — Other Ambulatory Visit: Payer: Self-pay | Admitting: Orthopaedic Surgery

## 2022-01-24 ENCOUNTER — Telehealth: Payer: Self-pay | Admitting: Orthopaedic Surgery

## 2022-01-24 MED ORDER — OXYCODONE HCL 5 MG PO TABS
5.0000 mg | ORAL_TABLET | ORAL | 0 refills | Status: DC | PRN
Start: 2022-01-24 — End: 2022-01-30

## 2022-01-24 NOTE — Telephone Encounter (Signed)
Pt called requesting a refill of oxycodone. Please send to pharmacy on file. Pt phone number is [phone]. ?

## 2022-01-30 ENCOUNTER — Telehealth: Payer: Self-pay | Admitting: Orthopaedic Surgery

## 2022-01-30 ENCOUNTER — Other Ambulatory Visit: Payer: Self-pay | Admitting: Orthopaedic Surgery

## 2022-01-30 MED ORDER — OXYCODONE HCL 5 MG PO TABS: 5.0000 mg | ORAL_TABLET | Freq: Four times a day (QID) | ORAL | 0 refills | Status: DC | PRN

## 2022-01-30 NOTE — Telephone Encounter (Signed)
Patient called. She would like a refill on oxycodone called in. Her call back number is [phone]

## 2022-02-04 ENCOUNTER — Telehealth: Payer: Self-pay | Admitting: Orthopaedic Surgery

## 2022-02-04 ENCOUNTER — Other Ambulatory Visit: Payer: Self-pay | Admitting: Orthopaedic Surgery

## 2022-02-04 MED ORDER — OXYCODONE HCL 5 MG PO TABS: 5.0000 mg | ORAL_TABLET | Freq: Four times a day (QID) | ORAL | 0 refills | Status: DC | PRN

## 2022-02-04 NOTE — Telephone Encounter (Signed)
[facility] [place]

## 2022-02-04 NOTE — Telephone Encounter (Signed)
Please advise 

## 2022-02-06 ENCOUNTER — Encounter: Payer: Self-pay | Admitting: Orthopaedic Surgery

## 2022-02-06 ENCOUNTER — Ambulatory Visit (INDEPENDENT_AMBULATORY_CARE_PROVIDER_SITE_OTHER): Payer: Medicare HMO | Admitting: Orthopaedic Surgery

## 2022-02-06 DIAGNOSIS — Z96651 Presence of right artificial knee joint: Secondary | ICD-10-CM

## 2022-02-06 NOTE — Progress Notes (Signed)
The patient is now 6 weeks status post a right total knee arthroplasty.  We have actually replaced her hips and her left knee before.  She says this right knee has been much harder for her.  She does have rheumatoid disease.  She is 66 years old.  She still needs refill of oxycodone which I did yesterday.  She has been going through physical therapy and been diligent about this.  She still has moderate swelling to be expected of her right operative knee.  Her extension is almost full and I can flex her to just past 90 degrees and a little bit beyond that.  I do not feel that she needs a manipulation under anesthesia based on her exam today.  She still uses a cane to ambulate which is to be expected.  I would like to see her back in 4 weeks for repeat exam.  No x-rays are needed.

## 2022-02-11 ENCOUNTER — Telehealth: Payer: Self-pay | Admitting: Orthopaedic Surgery

## 2022-02-11 ENCOUNTER — Other Ambulatory Visit: Payer: Self-pay | Admitting: Orthopaedic Surgery

## 2022-02-11 MED ORDER — OXYCODONE HCL 5 MG PO TABS
5.0000 mg | ORAL_TABLET | Freq: Four times a day (QID) | ORAL | 0 refills | Status: DC | PRN
Start: 2022-02-11 — End: 2022-02-19

## 2022-02-11 NOTE — Telephone Encounter (Signed)
Patient called needing Rx refilled Oxycodone. The number to contact patient is [phone]

## 2022-02-19 ENCOUNTER — Other Ambulatory Visit: Payer: Self-pay | Admitting: Orthopaedic Surgery

## 2022-02-19 ENCOUNTER — Telehealth: Payer: Self-pay | Admitting: Orthopaedic Surgery

## 2022-02-19 ENCOUNTER — Telehealth: Payer: Self-pay

## 2022-02-19 MED ORDER — OXYCODONE HCL 5 MG PO TABS: 5.0000 mg | ORAL_TABLET | Freq: Four times a day (QID) | ORAL | 0 refills | Status: DC | PRN

## 2022-02-19 NOTE — Telephone Encounter (Signed)
Pt would like refill on oxycodone

## 2022-02-19 NOTE — Telephone Encounter (Signed)
Please advise 

## 2022-02-19 NOTE — Telephone Encounter (Signed)
[facility] that the [facility] is [facility] back order. Lvm for pt to cb to give different pharmacy to send the rx to.

## 2022-02-20 ENCOUNTER — Telehealth: Payer: Self-pay | Admitting: Orthopaedic Surgery

## 2022-02-20 ENCOUNTER — Other Ambulatory Visit: Payer: Self-pay | Admitting: Orthopaedic Surgery

## 2022-02-20 MED ORDER — OXYCODONE HCL 5 MG PO TABS: 5.0000 mg | ORAL_TABLET | Freq: Four times a day (QID) | ORAL | 0 refills | Status: DC | PRN

## 2022-02-20 MED ORDER — METHOCARBAMOL 500 MG PO TABS: 500.0000 mg | ORAL_TABLET | Freq: Four times a day (QID) | ORAL | 1 refills | Status: DC | PRN

## 2022-02-20 NOTE — Telephone Encounter (Signed)
Patient called back and advised [facility] [place] [facility] not have [facility] in stock either.  Patient asked if the Rx can be sent to [facility] in [place] ?   The number to contact patient is [phone]

## 2022-02-20 NOTE — Telephone Encounter (Signed)
Please advise 

## 2022-02-20 NOTE — Telephone Encounter (Signed)
Please resend, thank you!

## 2022-02-20 NOTE — Telephone Encounter (Signed)
Patient called asked if the Rx for [facility]codone [facility] be [facility] [place] on [address] The Meth[facility]bamol can be [facility] [place]. The number to contact patient is [phone]

## 2022-02-27 ENCOUNTER — Other Ambulatory Visit: Payer: Self-pay | Admitting: Orthopaedic Surgery

## 2022-02-27 ENCOUNTER — Telehealth: Payer: Self-pay | Admitting: Orthopaedic Surgery

## 2022-02-27 MED ORDER — METHOCARBAMOL 500 MG PO TABS: 500.0000 mg | ORAL_TABLET | Freq: Four times a day (QID) | ORAL | 1 refills | Status: DC | PRN

## 2022-02-27 MED ORDER — OXYCODONE HCL 5 MG PO TABS: 5.0000 mg | ORAL_TABLET | Freq: Four times a day (QID) | ORAL | 0 refills | Status: DC | PRN

## 2022-02-27 NOTE — Telephone Encounter (Signed)
Patient called. She would like a refill on oxycodone and robaxin [facility] [place]. Her call back number is [phone]

## 2022-03-06 ENCOUNTER — Telehealth: Payer: Self-pay | Admitting: Orthopaedic Surgery

## 2022-03-06 ENCOUNTER — Other Ambulatory Visit: Payer: Self-pay | Admitting: Orthopaedic Surgery

## 2022-03-06 MED ORDER — METHOCARBAMOL 500 MG PO TABS
500.0000 mg | ORAL_TABLET | Freq: Four times a day (QID) | ORAL | 1 refills | Status: DC | PRN
Start: 2022-03-06 — End: 2022-05-07

## 2022-03-06 MED ORDER — OXYCODONE HCL 5 MG PO TABS: 5.0000 mg | ORAL_TABLET | Freq: Four times a day (QID) | ORAL | 0 refills | Status: DC | PRN

## 2022-03-06 NOTE — Telephone Encounter (Signed)
Pt called requesting a refill of oxycodone and her muscle relaxer. Please send to pharmacy on file. Pt phone number is [phone].

## 2022-03-10 ENCOUNTER — Encounter: Payer: Self-pay | Admitting: Orthopaedic Surgery

## 2022-03-10 ENCOUNTER — Ambulatory Visit (INDEPENDENT_AMBULATORY_CARE_PROVIDER_SITE_OTHER): Payer: Medicare HMO | Admitting: Orthopaedic Surgery

## 2022-03-10 DIAGNOSIS — Z96651 Presence of right artificial knee joint: Secondary | ICD-10-CM

## 2022-03-10 MED ORDER — DOXYCYCLINE HYCLATE 100 MG PO TABS
100.0000 mg | ORAL_TABLET | Freq: Two times a day (BID) | ORAL | 0 refills | Status: DC
Start: 2022-03-10 — End: 2022-05-26

## 2022-03-10 NOTE — Progress Notes (Signed)
The patient is now between 10 and 11 weeks status post a right total knee arthroplasty.  She is doing well overall is been pushing yourself the range of motion.  Her extension is almost full with the right knee and her flexion is well past 100 degrees.  The knee feels limply stable.  There is a slight red hue lateral to the superior aspect of the incision.  She is someone who has rheumatologic disease so I feel it is appropriate to at least put her on an antibiotic prophylactically.  She agrees with this as well.  I will start doxycycline twice daily.  I would like to see her back in 2 weeks to see how she is doing overall.  We will have an AP and lateral of her right knee at that visit.

## 2022-03-14 ENCOUNTER — Other Ambulatory Visit: Payer: Self-pay | Admitting: Orthopaedic Surgery

## 2022-03-14 ENCOUNTER — Telehealth: Payer: Self-pay | Admitting: Orthopaedic Surgery

## 2022-03-14 MED ORDER — HYDROCODONE-ACETAMINOPHEN 7.5-325 MG PO TABS: 1.0000 | ORAL_TABLET | Freq: Four times a day (QID) | ORAL | 0 refills | Status: DC | PRN

## 2022-03-14 NOTE — Telephone Encounter (Signed)
Lvm informing 

## 2022-03-14 NOTE — Telephone Encounter (Signed)
Please advise 

## 2022-03-14 NOTE — Telephone Encounter (Signed)
Pt called requesting refill of oxycodone. Please send to pharmacy on file. Pt phone number is [phone].

## 2022-03-24 ENCOUNTER — Telehealth: Payer: Self-pay | Admitting: Orthopaedic Surgery

## 2022-03-24 ENCOUNTER — Other Ambulatory Visit: Payer: Self-pay | Admitting: Orthopaedic Surgery

## 2022-03-24 MED ORDER — HYDROCODONE-ACETAMINOPHEN 7.5-325 MG PO TABS: 1.0000 | ORAL_TABLET | Freq: Four times a day (QID) | ORAL | 0 refills | Status: DC | PRN

## 2022-03-24 NOTE — Telephone Encounter (Signed)
Please advise 

## 2022-03-24 NOTE — Telephone Encounter (Signed)
Pt called requesting refill of pain medication. Pleases send to pharmacy on file. Pt phone number is [phone].

## 2022-03-26 ENCOUNTER — Other Ambulatory Visit: Payer: Self-pay | Admitting: Orthopaedic Surgery

## 2022-03-26 ENCOUNTER — Encounter: Payer: Self-pay | Admitting: Orthopaedic Surgery

## 2022-03-26 ENCOUNTER — Ambulatory Visit (INDEPENDENT_AMBULATORY_CARE_PROVIDER_SITE_OTHER): Payer: Medicare HMO

## 2022-03-26 ENCOUNTER — Ambulatory Visit (INDEPENDENT_AMBULATORY_CARE_PROVIDER_SITE_OTHER): Payer: Medicare HMO | Admitting: Orthopaedic Surgery

## 2022-03-26 DIAGNOSIS — Z96651 Presence of right artificial knee joint: Secondary | ICD-10-CM | POA: Diagnosis not present

## 2022-03-26 MED ORDER — HYDROCODONE-ACETAMINOPHEN 7.5-325 MG PO TABS: 1.0000 | ORAL_TABLET | Freq: Four times a day (QID) | ORAL | 0 refills | Status: DC | PRN

## 2022-03-26 NOTE — Progress Notes (Signed)
The patient is now close to 3 months status post a right total knee arthroplasty.  She has bilateral hip replacements and a left knee replacement.  Her extension has been lacking significantly but her flexion is great.  She still having some lateral knee pain but overall is making better progress.  She is walking without assistive device.  On my exam today her flexion is almost full of that right knee.  It feels stable.  She lacks full extension though by just over 5 degrees.  2 views of the right knee show well-seated total knee arthroplasty with no complicating features.  I am going to have her extend her outpatient physical therapy and if they can try any type of brace or other devices that help extend her knee I am fine with that.  I would like to see her back in 6 weeks for repeat exam but no x-rays are needed.  I do feel that I need to extend her pain medications a little longer at this point given continued aggressive therapy that is needed to try to maximize her motion.

## 2022-04-07 ENCOUNTER — Telehealth: Payer: Self-pay | Admitting: Orthopaedic Surgery

## 2022-04-07 ENCOUNTER — Other Ambulatory Visit: Payer: Self-pay | Admitting: Orthopaedic Surgery

## 2022-04-07 MED ORDER — HYDROCODONE-ACETAMINOPHEN 7.5-325 MG PO TABS: 1.0000 | ORAL_TABLET | Freq: Four times a day (QID) | ORAL | 0 refills | Status: DC | PRN

## 2022-04-07 NOTE — Telephone Encounter (Signed)
Kaitlyn [name] is requesting refill for Hydrocodone. She uses the [facility] in [place].

## 2022-04-17 ENCOUNTER — Telehealth: Payer: Self-pay | Admitting: Orthopaedic Surgery

## 2022-04-17 ENCOUNTER — Other Ambulatory Visit: Payer: Self-pay | Admitting: Orthopaedic Surgery

## 2022-04-17 MED ORDER — HYDROCODONE-ACETAMINOPHEN 7.5-325 MG PO TABS: 1.0000 | ORAL_TABLET | Freq: Four times a day (QID) | ORAL | 0 refills | Status: DC | PRN

## 2022-04-17 NOTE — Telephone Encounter (Signed)
[facility] and need to be sent to [facility] in [place]. Please call pt at [phone].

## 2022-04-17 NOTE — Telephone Encounter (Signed)
Refill on hydrocodone

## 2022-05-07 ENCOUNTER — Ambulatory Visit (INDEPENDENT_AMBULATORY_CARE_PROVIDER_SITE_OTHER): Payer: Medicare HMO | Admitting: Orthopaedic Surgery

## 2022-05-07 ENCOUNTER — Encounter: Payer: Self-pay | Admitting: Orthopaedic Surgery

## 2022-05-07 DIAGNOSIS — Z96651 Presence of right artificial knee joint: Secondary | ICD-10-CM

## 2022-05-07 MED ORDER — HYDROCODONE-ACETAMINOPHEN 7.5-325 MG PO TABS: 1.0000 | ORAL_TABLET | Freq: Four times a day (QID) | ORAL | 0 refills | Status: DC | PRN

## 2022-05-07 MED ORDER — METHOCARBAMOL 500 MG PO TABS
500.0000 mg | ORAL_TABLET | Freq: Four times a day (QID) | ORAL | 1 refills | Status: DC | PRN
Start: 2022-05-07 — End: 2022-06-26

## 2022-05-07 NOTE — Progress Notes (Signed)
The patient is now 4 months status post a right knee replacement.  She has remote history of a left knee replacement and bilateral hip replacement and we have performed all of these.  She is doing great overall and does need 1 more refill of medications for pain and spasms.  She is walk without assistive device.  She is ready to stop therapy as well.  She is a young and active 66 year old female.  Her operative right knee does still have some swelling to be expected but her range of motion is excellent.  I did send in a refill of hydrocodone and Robaxin for 1 more time.  She can stop outpatient physical therapy.  I will see her back in 6 months unless there are issues.  At that visit we will have a standing AP and lateral of her more recent right operative knee.  All question concerns were answered and addressed.

## 2022-05-26 ENCOUNTER — Other Ambulatory Visit: Payer: Self-pay | Admitting: Orthopaedic Surgery

## 2022-05-26 ENCOUNTER — Telehealth: Payer: Self-pay | Admitting: Orthopaedic Surgery

## 2022-05-26 MED ORDER — HYDROCODONE-ACETAMINOPHEN 5-325 MG PO TABS: 1.0000 | ORAL_TABLET | Freq: Three times a day (TID) | ORAL | 0 refills | Status: DC | PRN

## 2022-05-26 NOTE — Telephone Encounter (Signed)
Pt called requesting a refill of hydrocodone. Please send to pharmacy on file. Pt phone number is [phone].

## 2022-05-26 NOTE — Telephone Encounter (Signed)
Please advise 

## 2022-06-11 ENCOUNTER — Encounter: Payer: Self-pay | Admitting: Orthopaedic Surgery

## 2022-06-11 ENCOUNTER — Ambulatory Visit: Payer: Medicare HMO | Admitting: Orthopaedic Surgery

## 2022-06-11 ENCOUNTER — Ambulatory Visit (INDEPENDENT_AMBULATORY_CARE_PROVIDER_SITE_OTHER): Payer: Medicare HMO

## 2022-06-11 DIAGNOSIS — Z96651 Presence of right artificial knee joint: Secondary | ICD-10-CM

## 2022-06-11 NOTE — Progress Notes (Signed)
The patient is someone who has a history of bilateral shoulder replacements, bilateral knee replacements and bilateral hip replacements.  I have replaced her hips and her knees.  We replaced her right knee just in April of this year.  She recently was climbing up on a ladder doing painting and has been having some lateral knee pain on that right knee since then.  She comes in early for this to be checked out.  She does have rheumatoid disease as well.  Her right knee and still does have some swelling to be expected at 5 months postop.  Her left operative knee from several years ago appears normal.  She does have some lateral tenderness but the knee has full range of motion and is stable on exam.  X-rays showing 2 views of the right knee show a stable total knee arthroplasty.  I would like her to try Voltaren gel on the lateral aspect of her right knee at least 2-3 times a day.  I gave her reassurance that the knee is still doing well.  She said she needed that reassurance.  I can still see her back in 6 months for repeat 2 views of her right knee.

## 2022-06-26 ENCOUNTER — Telehealth: Payer: Self-pay | Admitting: Orthopaedic Surgery

## 2022-06-26 ENCOUNTER — Other Ambulatory Visit: Payer: Self-pay | Admitting: Orthopaedic Surgery

## 2022-06-26 MED ORDER — METHOCARBAMOL 500 MG PO TABS
500.0000 mg | ORAL_TABLET | Freq: Four times a day (QID) | ORAL | 1 refills | Status: DC | PRN
Start: 2022-06-26 — End: 2022-07-14

## 2022-06-26 MED ORDER — HYDROCODONE-ACETAMINOPHEN 5-325 MG PO TABS: 1.0000 | ORAL_TABLET | Freq: Three times a day (TID) | ORAL | 0 refills | Status: DC | PRN

## 2022-06-26 NOTE — Telephone Encounter (Signed)
Patient called in for refill on Hydrocodone and Methocarbamol,[facility] [place], please advise

## 2022-06-26 NOTE — Telephone Encounter (Signed)
Please advise 

## 2022-07-14 ENCOUNTER — Telehealth: Payer: Self-pay | Admitting: Orthopaedic Surgery

## 2022-07-14 ENCOUNTER — Other Ambulatory Visit: Payer: Self-pay | Admitting: Orthopaedic Surgery

## 2022-07-14 MED ORDER — HYDROCODONE-ACETAMINOPHEN 5-325 MG PO TABS: 1.0000 | ORAL_TABLET | Freq: Three times a day (TID) | ORAL | 0 refills | Status: DC | PRN

## 2022-07-14 MED ORDER — METHOCARBAMOL 500 MG PO TABS: 500.0000 mg | ORAL_TABLET | Freq: Four times a day (QID) | ORAL | 1 refills | Status: DC | PRN

## 2022-07-14 NOTE — Telephone Encounter (Signed)
Pt called requesting a refill of pain medication and muscle relaxer.Please send to pharmacy on file.  Pt states she fell and fractured her wrist and her other dr told her to call Dr Ninfa Linden for medication. Pt phone number is [phone].

## 2022-08-13 ENCOUNTER — Other Ambulatory Visit: Payer: Self-pay | Admitting: Orthopaedic Surgery

## 2022-08-13 ENCOUNTER — Telehealth: Payer: Self-pay | Admitting: Orthopaedic Surgery

## 2022-08-13 MED ORDER — HYDROCODONE-ACETAMINOPHEN 5-325 MG PO TABS: 1.0000 | ORAL_TABLET | Freq: Two times a day (BID) | ORAL | 0 refills | Status: DC | PRN

## 2022-08-13 MED ORDER — METHOCARBAMOL 500 MG PO TABS: 500.0000 mg | ORAL_TABLET | Freq: Four times a day (QID) | ORAL | 1 refills | Status: DC | PRN

## 2022-08-13 NOTE — Telephone Encounter (Signed)
Pt called requesting a refill of hydrocodone and methocarbamol. Please send to pharmacy on file. Pt phone number is [phone]

## 2022-09-16 ENCOUNTER — Telehealth: Payer: Self-pay | Admitting: Orthopedic Surgery

## 2022-09-16 ENCOUNTER — Other Ambulatory Visit: Payer: Self-pay | Admitting: Orthopaedic Surgery

## 2022-09-16 MED ORDER — HYDROCODONE-ACETAMINOPHEN 5-325 MG PO TABS: 1.0000 | ORAL_TABLET | Freq: Two times a day (BID) | ORAL | 0 refills | Status: DC | PRN

## 2022-09-16 MED ORDER — METHOCARBAMOL 500 MG PO TABS: 500.0000 mg | ORAL_TABLET | Freq: Four times a day (QID) | ORAL | 1 refills | Status: DC | PRN

## 2022-09-16 NOTE — Telephone Encounter (Signed)
[name] called in to request refills of her hydrocodone and muscle relaxant.  [facility] [place].

## 2022-09-16 NOTE — Telephone Encounter (Signed)
LMOM for patient of the below message  

## 2022-11-10 ENCOUNTER — Encounter: Payer: Medicare HMO | Admitting: Orthopaedic Surgery

## 2022-11-14 ENCOUNTER — Telehealth: Payer: Self-pay | Admitting: Orthopaedic Surgery

## 2022-11-14 ENCOUNTER — Other Ambulatory Visit: Payer: Self-pay | Admitting: Orthopaedic Surgery

## 2022-11-14 MED ORDER — METHOCARBAMOL 500 MG PO TABS: 500.0000 mg | ORAL_TABLET | Freq: Four times a day (QID) | ORAL | 1 refills | Status: DC | PRN

## 2022-11-14 NOTE — Telephone Encounter (Signed)
Patient called she would like a refill on hydrocodone and muscle relaxer. Cb# [phone]

## 2022-11-14 NOTE — Telephone Encounter (Signed)
Lvm for pt to cb to advise 

## 2022-12-10 ENCOUNTER — Other Ambulatory Visit (INDEPENDENT_AMBULATORY_CARE_PROVIDER_SITE_OTHER): Payer: Medicare HMO

## 2022-12-10 ENCOUNTER — Ambulatory Visit: Payer: Medicare HMO | Admitting: Orthopaedic Surgery

## 2022-12-10 DIAGNOSIS — Z96651 Presence of right artificial knee joint: Secondary | ICD-10-CM | POA: Diagnosis not present

## 2022-12-10 NOTE — Progress Notes (Signed)
The patient is a 67 year old with rheumatoid disease who has a history of both her hips replaced, both her knees replaced and both shoulders replaced.  Her most recent replacement was a year ago of the right knee.  I have replaced her hips and both knees.  She is doing well overall.  She is been dealing with right hand and wrist issues and had surgery by one of my colleagues in town.  She says here extracting it and wants to make sure that nothing was hurt when she did fall injuring her right wrist.  She does walk without an assistive device as well.  Both hips and both knees move smoothly and fluidly.  Both knees are ligamentously stable.  On a recent right operative knee shows excellent alignment on x-ray findings with no evidence of loosening.  At this point follow-up for joints can be as needed.  If she does develop any issues at all she knows to let us know.

## 2023-03-03 HISTORY — PX: OTHER SURGICAL HISTORY: SHX169

## 2023-03-04 ENCOUNTER — Other Ambulatory Visit: Payer: Self-pay | Admitting: Urology

## 2023-03-05 ENCOUNTER — Encounter (HOSPITAL_BASED_OUTPATIENT_CLINIC_OR_DEPARTMENT_OTHER): Payer: Self-pay | Admitting: Urology

## 2023-03-05 ENCOUNTER — Other Ambulatory Visit: Payer: Self-pay

## 2023-03-05 NOTE — Progress Notes (Signed)
Spoke w/ via phone for pre-op interview---Kaitlyn [name] needs dos---- I stat ekg              [name] results------none COVID test -----patient states asymptomatic no test needed Arrive at -------1000 am 03-20-2023 NPO after MN NO Solid Food.  Clear liquids from MN until---900 am Med rec completed Medications to take morning of surgery -----Amlodipine, Depakotem Gabapentin, Lorazepam prn, Levothyroxine, pantoprazole, Makalt prn Diabetic medication -----n/a Patient instructed no nail polish to be worn day of surgery Patient instructed to bring photo id and insurance card day of surgery Patient aware to have Driver (ride ) / caregiver  husband Ron   for 24 hours after surgery  Patient Special Instructions -----none Pre-Op special Instructions -----none Patient verbalized understanding of instructions that were given at this phone interview. Patient denies shortness of breath, chest pain, fever, cough at this phone interview.

## 2023-03-18 NOTE — H&P (Signed)
I have pain or burning with urination.  HPI: Kaitlyn [name] is a 67 year-old female established patient who is here for pain or burning while urinating.  She does have pain or burning when she urinates.   She does urinate more frequently than once every 4 hours in the daytime. She does not have to strain or bear down to start her urinary stream. She does not dribble at the end of urination. She does have an abnormal sensation when needing to urinate. She usually gets up at night to urinate 2 times.   03/02/23: Kaitlyn [name] returns today in f/u with a 1.5 week history of increased bladder and back pain along with pain with voiding. The pain is worse with a full bladder and relieved by emptying. She has had no hematuria. She has had no fever. She last had an HOD for her IC about 1.5 years ago and feels she needs that done again. She has MUI that is worse and generally improves with an HOD.   11/01/2021: 67 year old female who underwent hydrodistention and cystoscopy for IC presents today for postoperative appointment. She reports she is doing very well and her pain has resolved. She denies dysuria, fevers, chills. She has no complaints today.   09/30/21: Kaitlyn [name] returns today with recurrent symptoms with burning, back pain and nocturia 2-3x. Her UA is clear. Her symptoms are similar to her prior IC flairs and generally require HOD and cysto. She has a history of MUI and has frequency with urgency and a little bit of incontinence. She has had no hematuria.   02/01/2021: 67 year old female who underwent hydro distension with instillation of Pyridium and Marcaine into the bladder. She reports the procedure went well and she felt very good for a short time. However, last night she developed dysuria and has progressed until this morning. She denies inability to empty her bladder. She denies fevers and chills. She denies any bothersome urgency. She also denies gross hematuria, flank pain.   06/2019: Kaitlyn [name] returns today in fu  with the complaint of bladder pain. She has a long history of IC and last had HOD in 2018. She was supposed to have a cystoscopy with HOD earlier this year but that got cancelled because of a medical issue with her husband. She pain in the bladder with urgency and after voiding she has stranguria. She has had no hematuria. She has had no fever. She has some incontinence with coughing. She has some increased frequency and nocturia 3-4x. The symptoms are similar to her prior IC symptoms. She had Mx species on her last UA in June.   07/21/2019: Pt underwent hydrodistension with Dr Annabell Howells on 10/20. Marcaine and Pyridium were instilled into the bladder at conclusion of procedure. Bladder capacity noted to be 550 mL and 600 mL under subsequent fillings. Some mild trabeculations were noted as well as a few glomerular hemorrhages consistent with previously known interstitial cystitis diagnosis.   She returns today for follow-up. Painful and burning voiding have improved significantly. She also states her incontinence has improved, she is only voiding 2x/nightly presently. She still occasionally uses pyridium but not as frequently before her HOD. She denies any visible blood in the urine or trouble starting her stream. No post-operative f/c, n/v.   06/13/2020: Patient presents with one-week history of increased bladder pain and pressure. Also associated with painful and burning urination. No associated gross hematuria. She has mixed incontinence which is stable. Nocturia continues at her baseline x3. No associated fevers or chills, lower  back or flank pain/discomfort, nausea/vomiting. She denies interval treatment for UTI since last office visit. She is managing her symptoms currently with Pyridium which is only mild to moderately effective.   11/09/2020: When I saw her in September she had a positive urine culture for Klebsiella. She was treated with Macrobid but with failure of symptoms to improve Cipro was  prescribed by another provider. She tells me today at that time symptoms did resolve with return to baseline symptomatology. Since then she has had intermittent flare ups of her typical presentation with increased bladder pain, painful and burning urination as well as increased nocturia and associated intermittent urge incontinence. Her flares have not been significant enough to seek treatment. However on Monday of this week she had a severe exacerbation. She started taking Cipro which she had left over from a previous prescription as 1/2 tablet daily. This has helped some with the burning but she still complains of bladder pain and urgency as well as continued nocturia and urge incontinence. Denies visible blood in the urine. She feels like she is emptying well enough without any new or worsening obstructive signs. Symptoms not associated with fevers or chills, nausea/vomiting.   01/04/21: Kaitlyn [name] returns today in f/u for bladder pain and frequency with some dysuria but no hematuria. She has nocturia x 2-3. She has some intermittency. She has no urgency. Her UA and culture and culture were negative in February and she has not required an HOD in 2 years.     ALLERGIES: Sulfa Drugs - Skin Rash    MEDICATIONS: Levothyroxine Sodium 100 mcg tablet  Depakote  Iron  Lexapro 20 mg tablet  Pyridium 200 mg tablet 1 tablet PO TID PRN  Seroquel 200 mg tablet     GU PSH: Cystoscopy Hydrodistention - 10/04/2021, 2022, 2020, 2018, 2017, 2015, 2015, 2014, 2013, 2011, 2010, 2009 Hysterectomy Unilat SO - 2009     NON-GU PSH: Anesth, Shoulder Replacement Hip Replacement, Bilateral Remove Tonsils - 2009 Wrist Arthroscopy/surgery, Right     GU PMH: Interstitial Cystitis (w/o hematuria) - 11/01/2021, - 09/30/2021, - 2022, - 2022 (Worsening), She has increased pain from her IC and feels she needs another dilation. She has actually gone a fairly long time for her. I will get her set up for the procedure but sent a  culture to make sure she doesn't have infection. I reviewed the risks of the procedure in detail. , - 2018, Chronic interstitial cystitis without hematuria, - 2017, Chronic Interstitial Cystitis, - 2014 Painful micturition, Unspec - 11/01/2021, She has recurrent IC symptoms with a clear UA and has responded well to cystoscopy and HOD and she would like to proceed with that procedure. She is aware of the risks. I will get that set up ASAP. , - 09/30/2021, - 2022, She has recurrent IC symptoms and generally responds well to cystoscopy with HOD. I will get her scheduled and she is well aware of the risks and benefits. , - 2022, - 2022, - 2021 (Worsening), She has recurrent symptoms from her IC and would like to proceed with cystoscopy and HOD. She is well aware of the risks. , - 2020, She has symptoms that could be from cystitis or recurrent IC. I will send a culture and start macrobid pending the culture. If the culture is negative, she will probably need anesthetic cocktails or a repeat HOD. , - 2020 Mixed incontinence, She has frequency and urgency but only mild leakage. - 09/30/2021, - 2021 (Stable), She continues to have  mild incontinence. , - 2018 Subacute and chronic vaginitis, She has vaginal itching with prior improvement with fluconazole. I have sent a script for that. She has tolerated it in the past while on her current antidepressants. - 2020 Microscopic hematuria (Stable, Chronic), minimal stable and chronic. - 2018 Other microscopic hematuria, Microscopic hematuria - 2017 Oth GU systems Signs/Symptoms, Bladder pain - 2017 Urge incontinence, Urge incontinence of urine - 2017 Urinary Retention, Unspec, Acute Urinary Retention - 2014      PMH Notes:  2006-11-06 15:32:12 - Note: Arthritis   NON-GU PMH: Encounter for general adult medical examination without abnormal findings, Encounter for preventive health examination - 2017 Anxiety, Anxiety - 2014 Personal history of other diseases of the  digestive system, History of esophageal reflux - 2014 Personal history of other endocrine, nutritional and metabolic disease, History of hypercholesterolemia - 2014, History of hypothyroidism, - 2014 Personal history of other mental and behavioral disorders, History of depression - 2014    FAMILY HISTORY: Breast Cancer - Runs In Family Family Health Status Number - Runs In Family Lung Cancer - Mother, Father nephrolithiasis - Brother   SOCIAL HISTORY: Marital Status: Married Current Smoking Status: Patient smokes. Smokes 1/2 pack per day.   Tobacco Use Assessment Completed: Used Tobacco in last 30 days?    REVIEW OF SYSTEMS:    GU Review Female:   Patient reports burning /pain with urination. Patient denies hard to postpone urination, get up at night to urinate, leakage of urine, stream starts and stops, trouble starting your stream, have to strain to urinate, and being pregnant.  Gastrointestinal (Upper):   Patient denies nausea, vomiting, and indigestion/ heartburn.  Gastrointestinal (Lower):   Patient denies diarrhea and constipation.  Constitutional:   Patient denies weight loss, fever, night sweats, and fatigue.  Skin:   Patient denies skin rash/ lesion and itching.  Eyes:   Patient denies blurred vision and double vision.  Ears/ Nose/ Throat:   Patient denies sore throat and sinus problems.  Hematologic/Lymphatic:   Patient denies swollen glands and easy bruising.  Cardiovascular:   Patient denies leg swelling and chest pains.  Respiratory:   Patient denies cough and shortness of breath.  Endocrine:   Patient denies excessive thirst.  Musculoskeletal:   Patient denies back pain and joint pain.  Neurological:   Patient denies headaches and dizziness.  Psychologic:   Patient denies depression and anxiety.   VITAL SIGNS: None   MULTI-SYSTEM PHYSICAL EXAMINATION:    Constitutional: Well-nourished. No physical deformities. Normally developed. Good grooming.  Respiratory: Normal  breath sounds. No labored breathing, no use of accessory muscles.   Cardiovascular: Regular rate and rhythm. No murmur, no gallop.      Complexity of Data:  Records Review:   Previous Patient Records  Urine Test Review:   Urinalysis  Urodynamics Review:   Review Bladder Scan   PROCEDURES:         PVR Ultrasound - [address]  Scanned Volume: 0 cc         Visit Complexity - G2211 Chronic management         Urinalysis w/Scope Dipstick Dipstick Cont'd Micro  Color: Yellow Bilirubin: Neg mg/dL WBC/hpf: 10 - 60/AVW  Appearance: Cloudy Ketones: Neg mg/dL RBC/hpf: NS (Not Seen)  Specific Gravity: 1.015 Blood: Neg ery/uL Bacteria: Many (>50/hpf)  pH: 6.5 Protein: Neg mg/dL Cystals: Amorph Urates  Glucose: Neg mg/dL Urobilinogen: 0.2 mg/dL Casts: Hyaline    Nitrites: Positive Trichomonas: Not Present    Leukocyte Esterase: 2+  leu/uL Mucous: Present      Epithelial Cells: 10 - 20/hpf      Yeast: NS (Not Seen)      Sperm: Not Present    Notes: far too numerous to count mixed cellular components    ASSESSMENT:      ICD-10 Details  1 GU:   Interstitial Cystitis (w/o hematuria) - N30.10 Chronic, Worsening - She is having a symptom flair. Her UA his Nit+ but she is on pyridium. I will get a culture and start Macrobid but I will also go ahead and get her scheduled for an HOD. I have reviewed this risks in detail. She will let us know if her symptoms improve with the antibiotic and we could cancel if need be.   2   Painful micturition, Unspec - R30.9 Chronic, Worsening  3   Pelvic/perineal pain - R10.2 Chronic, Worsening  4   Mixed incontinence - N39.46 Chronic, Worsening  5   Acute Cystitis/UTI - N30.00 Acute, Uncomplicated   PLAN:            Medications New Meds: Macrobid 100 mg capsule 1 capsule PO BID   #14  0 Refill(s)  Pharmacy Name:  [id number]  Address:  [address]   [place], [place] [address]  Phone:  [phone]  Fax:  [phone]            Orders Labs Urine  Culture

## 2023-03-20 ENCOUNTER — Encounter (HOSPITAL_BASED_OUTPATIENT_CLINIC_OR_DEPARTMENT_OTHER): Payer: Self-pay | Admitting: Urology

## 2023-03-20 ENCOUNTER — Encounter (HOSPITAL_BASED_OUTPATIENT_CLINIC_OR_DEPARTMENT_OTHER)
Admission: RE | Disposition: A | Discharge status: Home / Self Care | Payer: Self-pay | Source: Home / Self Care | Attending: Urology

## 2023-03-20 ENCOUNTER — Other Ambulatory Visit: Payer: Self-pay

## 2023-03-20 ENCOUNTER — Ambulatory Visit (HOSPITAL_BASED_OUTPATIENT_CLINIC_OR_DEPARTMENT_OTHER): Payer: Medicare HMO | Admitting: Anesthesiology

## 2023-03-20 ENCOUNTER — Ambulatory Visit (HOSPITAL_BASED_OUTPATIENT_CLINIC_OR_DEPARTMENT_OTHER)
Admission: RE | Admit: 2023-03-20 | Discharge: 2023-03-20 | Disposition: A | Discharge status: Home / Self Care | Payer: Medicare HMO | Attending: Urology | Admitting: Urology

## 2023-03-20 DIAGNOSIS — N3011 Interstitial cystitis (chronic) with hematuria: Secondary | ICD-10-CM | POA: Insufficient documentation

## 2023-03-20 DIAGNOSIS — E039 Hypothyroidism, unspecified: Secondary | ICD-10-CM

## 2023-03-20 DIAGNOSIS — I10 Essential (primary) hypertension: Secondary | ICD-10-CM

## 2023-03-20 DIAGNOSIS — N3946 Mixed incontinence: Secondary | ICD-10-CM | POA: Insufficient documentation

## 2023-03-20 DIAGNOSIS — N3010 Interstitial cystitis (chronic) without hematuria: Secondary | ICD-10-CM

## 2023-03-20 DIAGNOSIS — F17210 Nicotine dependence, cigarettes, uncomplicated: Secondary | ICD-10-CM

## 2023-03-20 DIAGNOSIS — K219 Gastro-esophageal reflux disease without esophagitis: Secondary | ICD-10-CM | POA: Diagnosis not present

## 2023-03-20 DIAGNOSIS — Z01818 Encounter for other preprocedural examination: Secondary | ICD-10-CM

## 2023-03-20 DIAGNOSIS — N3001 Acute cystitis with hematuria: Secondary | ICD-10-CM | POA: Diagnosis not present

## 2023-03-20 DIAGNOSIS — Z79899 Other long term (current) drug therapy: Secondary | ICD-10-CM | POA: Insufficient documentation

## 2023-03-20 DIAGNOSIS — F419 Anxiety disorder, unspecified: Secondary | ICD-10-CM | POA: Insufficient documentation

## 2023-03-20 DIAGNOSIS — F32A Depression, unspecified: Secondary | ICD-10-CM | POA: Insufficient documentation

## 2023-03-20 HISTORY — PX: CYSTOSCOPY WITH HYDRODISTENSION AND BIOPSY: SHX5127

## 2023-03-20 LAB — POCT I-STAT, CHEM 8
BUN: 25 mg/dL — ABNORMAL HIGH (ref 8–23)
BUN: 26 mg/dL — ABNORMAL HIGH (ref 8–23)
Calcium, Ion: 1.19 mmol/L (ref 1.15–1.40)
Calcium, Ion: 1.2 mmol/L (ref 1.15–1.40)
Chloride: 108 mmol/L (ref 98–111)
Chloride: 109 mmol/L (ref 98–111)
Creatinine, Ser: 1.4 mg/dL — ABNORMAL HIGH (ref 0.44–1.00)
Creatinine, Ser: 1.5 mg/dL — ABNORMAL HIGH (ref 0.44–1.00)
Glucose, Bld: 98 mg/dL (ref 70–99)
Glucose, Bld: 99 mg/dL (ref 70–99)
HCT: 40 % (ref 36.0–46.0)
HCT: 43 % (ref 36.0–46.0)
Hemoglobin: 13.6 g/dL (ref 12.0–15.0)
Hemoglobin: 14.6 g/dL (ref 12.0–15.0)
Potassium: 5.4 mmol/L — ABNORMAL HIGH (ref 3.5–5.1)
Potassium: 5.4 mmol/L — ABNORMAL HIGH (ref 3.5–5.1)
Sodium: 140 mmol/L (ref 135–145)
Sodium: 141 mmol/L (ref 135–145)
TCO2: 24 mmol/L (ref 22–32)
TCO2: 24 mmol/L (ref 22–32)

## 2023-03-20 SURGERY — CYSTOSCOPY, WITH BLADDER HYDRODISTENSION AND BIOPSY
Anesthesia: General

## 2023-03-20 MED ORDER — HYDROCODONE-ACETAMINOPHEN 5-325 MG PO TABS: 1.0000 | ORAL_TABLET | Freq: Four times a day (QID) | ORAL | 0 refills | Status: DC | PRN

## 2023-03-20 MED ORDER — SODIUM CHLORIDE 0.9% FLUSH: 3.0000 mL | Freq: Two times a day (BID) | INTRAVENOUS | Status: DC

## 2023-03-20 MED ORDER — ONDANSETRON HCL 4 MG/2ML IJ SOLN
INTRAMUSCULAR | Status: AC
  Filled 2023-03-20: qty 2

## 2023-03-20 MED ORDER — ACETAMINOPHEN 500 MG PO TABS
1000.0000 mg | ORAL_TABLET | Freq: Once | ORAL | Status: AC
  Administered 2023-03-20: 1000 mg via ORAL

## 2023-03-20 MED ORDER — STERILE WATER FOR IRRIGATION IR SOLN
Status: DC | PRN
  Administered 2023-03-20: 3000 mL

## 2023-03-20 MED ORDER — FENTANYL CITRATE (PF) 100 MCG/2ML IJ SOLN
INTRAMUSCULAR | Status: DC | PRN
  Administered 2023-03-20: 25 ug via INTRAVENOUS
  Administered 2023-03-20: 50 ug via INTRAVENOUS

## 2023-03-20 MED ORDER — PROPOFOL 10 MG/ML IV BOLUS
INTRAVENOUS | Status: AC
  Filled 2023-03-20: qty 20

## 2023-03-20 MED ORDER — FENTANYL CITRATE (PF) 100 MCG/2ML IJ SOLN
25.0000 ug | INTRAMUSCULAR | Status: DC | PRN
  Administered 2023-03-20: 25 ug via INTRAVENOUS

## 2023-03-20 MED ORDER — SODIUM CHLORIDE 0.9% FLUSH: 3.0000 mL | INTRAVENOUS | Status: DC | PRN

## 2023-03-20 MED ORDER — ACETAMINOPHEN 325 MG PO TABS: 650.0000 mg | ORAL_TABLET | ORAL | Status: DC | PRN

## 2023-03-20 MED ORDER — MIDAZOLAM HCL 2 MG/2ML IJ SOLN
INTRAMUSCULAR | Status: AC
  Filled 2023-03-20: qty 2

## 2023-03-20 MED ORDER — DEXAMETHASONE SODIUM PHOSPHATE 10 MG/ML IJ SOLN
INTRAMUSCULAR | Status: AC
  Filled 2023-03-20: qty 1

## 2023-03-20 MED ORDER — LIDOCAINE HCL (PF) 2 % IJ SOLN
INTRAMUSCULAR | Status: AC
  Filled 2023-03-20: qty 5

## 2023-03-20 MED ORDER — FENTANYL CITRATE (PF) 100 MCG/2ML IJ SOLN
INTRAMUSCULAR | Status: AC
  Filled 2023-03-20: qty 2

## 2023-03-20 MED ORDER — ACETAMINOPHEN 500 MG PO TABS
ORAL_TABLET | ORAL | Status: AC
  Filled 2023-03-20: qty 2

## 2023-03-20 MED ORDER — SODIUM CHLORIDE 0.9 % IV SOLN: 250.0000 mL | INTRAVENOUS | Status: DC | PRN

## 2023-03-20 MED ORDER — LIDOCAINE HCL (CARDIAC) PF 100 MG/5ML IV SOSY
PREFILLED_SYRINGE | INTRAVENOUS | Status: DC | PRN
  Administered 2023-03-20: 80 mg via INTRAVENOUS

## 2023-03-20 MED ORDER — MORPHINE SULFATE (PF) 4 MG/ML IV SOLN: 2.0000 mg | INTRAVENOUS | Status: DC | PRN

## 2023-03-20 MED ORDER — ACETAMINOPHEN 325 MG RE SUPP: 650.0000 mg | RECTAL | Status: DC | PRN

## 2023-03-20 MED ORDER — PHENAZOPYRIDINE HCL 200 MG PO TABS
ORAL | Status: DC | PRN
  Administered 2023-03-20: 15 mL via INTRAVESICAL

## 2023-03-20 MED ORDER — CEFAZOLIN SODIUM-DEXTROSE 2-4 GM/100ML-% IV SOLN
INTRAVENOUS | Status: AC
  Filled 2023-03-20: qty 100

## 2023-03-20 MED ORDER — LACTATED RINGERS IV SOLN: INTRAVENOUS | Status: DC

## 2023-03-20 MED ORDER — OXYCODONE HCL 5 MG PO TABS: 5.0000 mg | ORAL_TABLET | ORAL | Status: DC | PRN

## 2023-03-20 MED ORDER — DEXAMETHASONE SODIUM PHOSPHATE 10 MG/ML IJ SOLN
INTRAMUSCULAR | Status: DC | PRN
  Administered 2023-03-20: 8 mg via INTRAVENOUS

## 2023-03-20 MED ORDER — CEFAZOLIN SODIUM-DEXTROSE 2-4 GM/100ML-% IV SOLN
2.0000 g | INTRAVENOUS | Status: AC
  Administered 2023-03-20: 2 g via INTRAVENOUS

## 2023-03-20 MED ORDER — ONDANSETRON HCL 4 MG/2ML IJ SOLN
INTRAMUSCULAR | Status: DC | PRN
  Administered 2023-03-20: 4 mg via INTRAVENOUS

## 2023-03-20 MED ORDER — ONDANSETRON HCL 4 MG/2ML IJ SOLN: 4.0000 mg | Freq: Once | INTRAMUSCULAR | Status: DC | PRN

## 2023-03-20 MED ORDER — PROPOFOL 10 MG/ML IV BOLUS
INTRAVENOUS | Status: DC | PRN
  Administered 2023-03-20: 200 mg via INTRAVENOUS

## 2023-03-20 SURGICAL SUPPLY — 23 items
BAG DRAIN URO-CYSTO SKYTR STRL (DRAIN) ×1 IMPLANT
BAG DRN RND TRDRP ANRFLXCHMBR (UROLOGICAL SUPPLIES) ×1
BAG DRN UROCATH (DRAIN) ×1
BAG URINE DRAIN 2000ML AR STRL (UROLOGICAL SUPPLIES) IMPLANT
CATH FOLEY 2WAY SLVR 5CC 16FR (CATHETERS) IMPLANT
CATH ROBINSON RED A/P 16FR (CATHETERS) ×1 IMPLANT
CLOTH BEACON ORANGE TIMEOUT ST (SAFETY) ×1 IMPLANT
ELECT REM PT RETURN 9FT ADLT (ELECTROSURGICAL) ×1
ELECTRODE REM PT RTRN 9FT ADLT (ELECTROSURGICAL) ×1 IMPLANT
GLOVE SURG SS PI 8.0 STRL IVOR (GLOVE) ×1 IMPLANT
GOWN STRL REUS W/TWL XL LVL3 (GOWN DISPOSABLE) ×1 IMPLANT
HOLDER FOLEY CATH W/STRAP (MISCELLANEOUS) IMPLANT
KIT TURNOVER CYSTO (KITS) ×1 IMPLANT
MANIFOLD NEPTUNE II (INSTRUMENTS) ×1 IMPLANT
NDL SAFETY ECLIP 18X1.5 (MISCELLANEOUS) IMPLANT
NS IRRIG 500ML POUR BTL (IV SOLUTION) IMPLANT
PACK CYSTO (CUSTOM PROCEDURE TRAY) ×1 IMPLANT
PLUG CATH AND CAP STER (CATHETERS) IMPLANT
PLUG CATH AND CAP STRL 200 (CATHETERS) IMPLANT
SLEEVE SCD COMPRESS KNEE MED (STOCKING) ×1 IMPLANT
SYR 30ML LL (SYRINGE) ×1 IMPLANT
TUBE CONNECTING 12X1/4 (SUCTIONS) IMPLANT
WATER STERILE IRR 3000ML UROMA (IV SOLUTION) ×1 IMPLANT

## 2023-03-20 NOTE — Transfer of Care (Signed)
Immediate Anesthesia Transfer of Care Note  Patient: [name]  Procedure(s) Performed: CYSTOSCOPY/HYDRODISTENSION OF BLADDER INSTILL MARCAINE AND PYRIDIUM  Patient Location: PACU  Anesthesia Type:General  Level of Consciousness: awake, alert , oriented, and patient cooperative  Airway & Oxygen Therapy: Patient Spontanous Breathing and Patient connected to face mask oxygen  Post-op Assessment: Report given to RN and Post -op Vital signs reviewed and stable  Post vital signs: Reviewed and stable  Last Vitals:  Vitals Value Taken Time  BP 114/63 03/20/23 1200  Temp    Pulse 72 03/20/23 1201  Resp 12 03/20/23 1201  SpO2 100 % 03/20/23 1201  Vitals shown include unvalidated device data.  Last Pain:  Vitals:   03/20/23 1009  TempSrc: Oral  PainSc: 4       Patients Stated Pain Goal: 7 (03/20/23 1009)  Complications: No notable events documented.

## 2023-03-20 NOTE — Discharge Instructions (Addendum)
CYSTOSCOPY HOME CARE INSTRUCTIONS  Activity: Rest for the remainder of the day.  Do not drive or operate equipment today.  You may resume normal activities in one to two days as instructed by your physician.   Meals: Drink plenty of liquids and eat light foods such as gelatin or soup this evening.  You may return to a normal meal plan tomorrow.  Return to Work: You may return to work in one to two days or as instructed by your physician.  Special Instructions / Symptoms: Call your physician if any of these symptoms occur:   -persistent or heavy bleeding  -bleeding which continues after first few urination  -large blood clots that are difficult to pass  -urine stream diminishes or stops completely  -fever equal to or higher than 101 degrees Farenheit.  -cloudy urine with a strong, foul odor  -severe pain  Females should always wipe from front to back after elimination.  You may feel some burning pain when you urinate.  This should disappear with time.  Applying moist heat to the lower abdomen or a hot tub bath may help relieve the pain. \  You may remove the catheter on Monday morning be following the included instructions. If you dont' feel you can do that, please contact the office.    You were given Tylenol by mouth this morning prior to your surgical procedure, you may take Tylenol again after 4:30pm today

## 2023-03-20 NOTE — Interval H&P Note (Signed)
History and Physical Interval Note:  03/20/2023 11:21 AM  Kaitlyn [name]  has presented today for surgery, with the diagnosis of INTERSTITIAL CYSTITIS.  The various methods of treatment have been discussed with the patient and family. After consideration of risks, benefits and other options for treatment, the patient has consented to  Procedure(s): CYSTOSCOPY/HYDRODISTENSION OF BLADDER INSTILL MARCAINE AND PYRIDIUM (N/A) as a surgical intervention.  The patient's history has been reviewed, patient examined, no change in status, stable for surgery.  I have reviewed the patient's chart and labs.  Questions were answered to the patient's satisfaction.     Bjorn Pippin

## 2023-03-20 NOTE — Anesthesia Procedure Notes (Signed)
Procedure Name: LMA Insertion Date/Time: 03/20/2023 11:31 AM  Performed by: Garth Bigness, CRNAPre-anesthesia Checklist: Patient identified, Emergency Drugs available, Suction available and Patient being monitored Patient Re-evaluated:Patient Re-evaluated prior to induction Oxygen Delivery Method: Circle system utilized Preoxygenation: Pre-oxygenation with 100% oxygen Induction Type: IV induction Ventilation: Mask ventilation without difficulty LMA: LMA inserted LMA Size: 3.0 Tube type: Oral Number of attempts: 1 Placement Confirmation: positive ETCO2 and breath sounds checked- equal and bilateral Tube secured with: Tape Dental Injury: Teeth and Oropharynx as per pre-operative assessment

## 2023-03-20 NOTE — Anesthesia Preprocedure Evaluation (Addendum)
Anesthesia Evaluation  Patient identified by MRN, date of birth, ID band Patient awake    Reviewed: Allergy & Precautions, NPO status , Patient's Chart, lab work & pertinent test results  History of Anesthesia Complications (+) PONV and history of anesthetic complications  Airway Mallampati: II  TM Distance: >3 FB Neck ROM: Full    Dental  (+) Dental Advisory Given, Upper Dentures   Pulmonary Current Smoker and Patient abstained from smoking.   Pulmonary exam normal breath sounds clear to auscultation       Cardiovascular hypertension, Pt. on medications Normal cardiovascular exam Rhythm:Regular Rate:Normal     Neuro/Psych  Headaches PSYCHIATRIC DISORDERS Anxiety Depression       GI/Hepatic Neg liver ROS,GERD  Medicated and Controlled,,  Endo/Other  Hypothyroidism    Renal/GU negative Renal ROS Bladder dysfunction  Interstitial cystitis     Musculoskeletal  (+) Arthritis ,    Abdominal   Peds  Hematology negative hematology ROS (+)   Anesthesia Other Findings Day of surgery medications reviewed with the patient.  Reproductive/Obstetrics                              Anesthesia Physical Anesthesia Plan  ASA: 3  Anesthesia Plan: General   Post-op Pain Management: Tylenol PO (pre-op)* and Toradol IV (intra-op)*   Induction: Intravenous  PONV Risk Score and Plan: 4 or greater and Midazolam, Dexamethasone and Ondansetron  Airway Management Planned: LMA  Additional Equipment:   Intra-op Plan:   Post-operative Plan: Extubation in OR  Informed Consent: I have reviewed the patients History and Physical, chart, labs and discussed the procedure including the risks, benefits and alternatives for the proposed anesthesia with the patient or authorized representative who has indicated his/her understanding and acceptance.     Dental advisory given  Plan Discussed with:  CRNA  Anesthesia Plan Comments:          Anesthesia Quick Evaluation

## 2023-03-20 NOTE — Op Note (Signed)
Procedure: Cystoscopy with hydrodistention of the bladder, instillation of Pyridium and Marcaine.  Preop diagnosis: Interstitial cystitis.  Postop diagnosis: Same.  Surgeon: Dr. Bjorn Pippin.  Anesthesia: General.  Specimen: None.  Drains: 16 French Foley catheter.  EBL: None.  Complications: None.  Indications: The patient is a 67 year old female with a long history of interstitial cystitis has had increased symptoms recently and has elected to undergo hydrodistention of the bladder.  Procedure: She was taken the operating room where general anesthetic was induced.  She was given antibiotics.  She was placed in lithotomy position and fitted with PAS hose.  Her perineum and genitalia were prepped with Betadine solution she was draped in usual sterile fashion.  Cystoscopy was performed using the 21 [place] scope and 30 degree lens.  Examination revealed a normal urethra.  The bladder wall had mild trabeculation without tumors, stones or inflammation.  Ureteral orifices were unremarkable.  She was then filled to capacity at 80 centimeters of water pressure and the bladder was then drained.  She held 600 mL of fluid a capacity.  After decompressing the bladder she was noted to have diffuse glomerulations consistent with a diagnosis of interstitial cystitis with some hematuria.  The cystoscope was removed and a 16 French Foley catheter was inserted.  The balloon was filled with 10 mL of sterile fluid.  The bladder was then instilled with 15 mL of 0.5% Marcaine with 400 mg of crushed Pyridium.  The catheter was then plugged.  She was taken down from lithotomy position, her anesthetic was reversed and she was moved to recovery room in stable condition.  The catheter was unplugged in the recovery room and she was sent home with a drainage bag.  There was no complications.

## 2023-03-23 ENCOUNTER — Encounter (HOSPITAL_BASED_OUTPATIENT_CLINIC_OR_DEPARTMENT_OTHER): Payer: Self-pay | Admitting: Urology

## 2023-03-23 NOTE — Anesthesia Postprocedure Evaluation (Signed)
Anesthesia Post Note  Patient: Kaitlyn [name]  Procedure(s) Performed: CYSTOSCOPY/HYDRODISTENSION OF BLADDER INSTILL MARCAINE AND PYRIDIUM     Patient location during evaluation: PACU Anesthesia Type: General Level of consciousness: awake and alert Pain management: pain level controlled Vital Signs Assessment: post-procedure vital signs reviewed and stable Respiratory status: spontaneous breathing, nonlabored ventilation, respiratory function stable and patient connected to nasal cannula oxygen Cardiovascular status: blood pressure returned to baseline and stable Postop Assessment: no apparent nausea or vomiting Anesthetic complications: no   No notable events documented.  Last Vitals:  Vitals:   03/20/23 1230 03/20/23 1313  BP:  110/62  Pulse:  70  Resp:  15  Temp: 36.6 C 36.4 C  SpO2:  93%    Last Pain:  Vitals:   03/20/23 1313  TempSrc:   PainSc: 0-No pain                 Collene Schlichter

## 2023-08-21 ENCOUNTER — Other Ambulatory Visit: Payer: Self-pay | Admitting: Urology

## 2023-08-25 NOTE — Patient Instructions (Signed)
SURGICAL WAITING ROOM VISITATION  Patients having surgery or a procedure may have no more than 2 support people in the waiting area - these visitors may rotate.    Children under the age of 66 must have an adult with them who is not the patient.  Due to an increase in RSV and influenza rates and associated hospitalizations, children ages 87 and under may not visit patients in [facility] hospitals.  If the patient needs to stay at the hospital during part of their recovery, the visitor guidelines for inpatient rooms apply. Pre-op nurse will coordinate an appropriate time for 1 support person to accompany patient in pre-op.  This support person may not rotate.    Please refer to the [facility] website for the visitor guidelines for Inpatients (after your surgery is over and you are in a regular room).       Your procedure is scheduled on: 08/28/23   Report to [facility] Main Entrance    Report to admitting at 7AM   Call this number if you have problems the morning of surgery [phone]   Do not eat food  or drink liquids :After Midnight.      Oral Hygiene is also important to reduce your risk of infection.                                    Remember - BRUSH YOUR TEETH THE MORNING OF SURGERY WITH YOUR REGULAR TOOTHPASTE   Stop all vitamins and herbal supplements 7 days before surgery.   Take these medicines the morning of surgery with A SIP OF WATER: Amlodipine, depakote, escitaprolam, levothyroxine and pantoprazole            You may not have any metal on your body including hair pins, jewelry, and body piercing             Do not wear make-up, lotions, powders, perfumes/cologne, or deodorant  Do not wear nail polish including gel and S&S, artificial/acrylic nails, or any other type of covering on natural nails including finger and toenails. If you have artificial nails, gel coating, etc. that needs to be removed by a nail salon please have this removed prior to  surgery or surgery may need to be canceled/ delayed if the surgeon/ anesthesia feels like they are unable to be safely monitored.   Do not shave  48 hours prior to surgery.    Do not bring valuables to the hospital. [facility] IS NOT             RESPONSIBLE   FOR VALUABLES.   Contacts, glasses, dentures or bridgework may not be worn into surgery.  DO NOT BRING YOUR HOME MEDICATIONS TO THE HOSPITAL. PHARMACY WILL DISPENSE MEDICATIONS LISTED ON YOUR MEDICATION LIST TO YOU DURING YOUR ADMISSION IN THE HOSPITAL!    Patients discharged on the day of surgery will not be allowed to drive home.  Someone NEEDS to stay with you for the first 24 hours after anesthesia.   Special Instructions: Bring a copy of your healthcare power of attorney and living will documents the day of surgery if you haven't scanned them before.              Please read over the following fact sheets you were given: IF YOU HAVE QUESTIONS ABOUT YOUR PRE-OP INSTRUCTIONS PLEASE CALL [phone] Rosey [place]   If you received a COVID test during  your pre-op visit  it is requested that you wear a mask when out in public, stay away from anyone that may not be feeling well and notify your surgeon if you develop symptoms. If you test positive for Covid or have been in contact with anyone that has tested positive in the last 10 days please notify you surgeon.    [place] - Preparing for Surgery Before surgery, you can play an important role.  Because skin is not sterile, your skin needs to be as free of germs as possible.  You can reduce the number of germs on your skin by washing with CHG (chlorahexidine gluconate) soap before surgery.  CHG is an antiseptic cleaner which kills germs and bonds with the skin to continue killing germs even after washing. Please DO NOT use if you have an allergy to CHG or antibacterial soaps.  If your skin becomes reddened/irritated stop using the CHG and inform your nurse when you arrive at Short Stay. Do  not shave (including legs and underarms) for at least 48 hours prior to the first CHG shower.  You may shave your face/neck.  Please follow these instructions carefully:  1.  Shower with CHG Soap the night before surgery and the  morning of surgery.  2.  If you choose to wash your hair, wash your hair first as usual with your normal  shampoo.  3.  After you shampoo, rinse your hair and body thoroughly to remove the shampoo.                             4.  Use CHG as you would any other liquid soap.  You can apply chg directly to the skin and wash.  Gently with a scrungie or clean washcloth.  5.  Apply the CHG Soap to your body ONLY FROM THE NECK DOWN.   Do   not use on face/ open                           Wound or open sores. Avoid contact with eyes, ears mouth and   genitals (private parts).                       Wash face,  Genitals (private parts) with your normal soap.             6.  Wash thoroughly, paying special attention to the area where your    surgery  will be performed.  7.  Thoroughly rinse your body with warm water from the neck down.  8.  DO NOT shower/wash with your normal soap after using and rinsing off the CHG Soap.                9.  Pat yourself dry with a clean towel.            10.  Wear clean pajamas.            11.  Place clean sheets on your bed the night of your first shower and do not  sleep with pets. Day of Surgery : Do not apply any lotions/deodorants the morning of surgery.  Please wear clean clothes to the hospital/surgery center.  FAILURE TO FOLLOW THESE INSTRUCTIONS MAY RESULT IN THE CANCELLATION OF YOUR SURGERY  PATIENT SIGNATURE_________________________________  NURSE SIGNATURE__________________________________  ________________________________________________________________________

## 2023-08-25 NOTE — Progress Notes (Signed)
COVID Vaccine received:  []  No [x]  Yes Date of any COVID positive Test in last 90 days: no PCP - Keturah Barre MD Cardiologist - no  Chest x-ray -  EKG -  03/20/23 Epic Stress Test -  ECHO -  Cardiac Cath -   Bowel Prep - [x]  No  []   Yes ______  Pacemaker / ICD device [x]  No []  Yes   Spinal Cord Stimulator:[x]  No []  Yes       History of Sleep Apnea? [x]  No []  Yes   CPAP used?- [x]  No []  Yes    Does the patient monitor blood sugar?          [x]  No []  Yes  []  N/A  Patient has: [x]  NO Hx DM   []  Pre-DM                 []  DM1  []   DM2 Does patient have a [employer] or [employer]? []  No []  Yes   Fasting Blood Sugar Ranges-  Checks Blood Sugar _____ times a day  GLP1 agonist / usual dose - no GLP1 instructions:  SGLT-2 inhibitors / usual dose - no SGLT-2 instructions:   Blood Thinner / Instructions:no Aspirin Instructions:no  Comments:   Activity level: Patient is able  to climb a flight of stairs without difficulty; [x]  No CP  [x]  No SOB, ___   Patient can  perform ADLs without assistance.   Anesthesia review:   Patient denies shortness of breath, fever, cough and chest pain at PAT appointment.  Patient verbalized understanding and agreement to the Pre-Surgical Instructions that were given to them at this PAT appointment. Patient was also educated of the need to review these PAT instructions again prior to his/her surgery.I reviewed the appropriate phone numbers to call if they have any and questions or concerns.

## 2023-08-26 NOTE — H&P (Signed)
I have pain or burning with urination.  HPI: Kaitlyn [name] is a 67 year-old female established patient who is here for pain or burning while urinating.  She does have pain or burning when she urinates.   She does urinate more frequently than once every 4 hours in the daytime. She does not have to strain or bear down to start her urinary stream. She does not dribble at the end of urination. She does have an abnormal sensation when needing to urinate. She usually gets up at night to urinate 2 times.   08/19/23: Kaitlyn [name] returns today in f/u. She has a history of IC with need for repeat HOD's with the last on 03/20/23. She has had some increased symptoms for the last 1.5 weeks. She has frequency, dysuria and pain with a full bladder consistent with her prior IC flairs. She has increased her coffee intake to 2 cups daily. She has stable MUI.   04/03/23: SHe underwent HOD with marcain and pyridium instillation. She tolerated the surgery well. Her urine is clear to day. She states that all of her pain is gone and she is very pleased with her results.   03/02/23: Kaitlyn [name] returns today in f/u with a 1.5 week history of increased bladder and back pain along with pain with voiding. The pain is worse with a full bladder and relieved by emptying. She has had no hematuria. She has had no fever. She last had an HOD for her IC about 1.5 years ago and feels she needs that done again. She has MUI that is worse and generally improves with an HOD.   11/01/2021: 67 year old female who underwent hydrodistention and cystoscopy for IC presents today for postoperative appointment. She reports she is doing very well and her pain has resolved. She denies dysuria, fevers, chills. She has no complaints today.   09/30/21: Kaitlyn [name] returns today with recurrent symptoms with burning, back pain and nocturia 2-3x. Her UA is clear. Her symptoms are similar to her prior IC flairs and generally require HOD and cysto. She has a history of MUI and has  frequency with urgency and a little bit of incontinence. She has had no hematuria.   02/01/2021: 67 year old female who underwent hydro distension with instillation of Pyridium and Marcaine into the bladder. She reports the procedure went well and she felt very good for a short time. However, last night she developed dysuria and has progressed until this morning. She denies inability to empty her bladder. She denies fevers and chills. She denies any bothersome urgency. She also denies gross hematuria, flank pain.   06/2019: Kaitlyn [name] returns today in fu with the complaint of bladder pain. She has a long history of IC and last had HOD in 2018. She was supposed to have a cystoscopy with HOD earlier this year but that got cancelled because of a medical issue with her husband. She pain in the bladder with urgency and after voiding she has stranguria. She has had no hematuria. She has had no fever. She has some incontinence with coughing. She has some increased frequency and nocturia 3-4x. The symptoms are similar to her prior IC symptoms. She had Mx species on her last UA in June.   07/21/2019: Pt underwent hydrodistension with Dr Annabell Howells on 10/20. Marcaine and Pyridium were instilled into the bladder at conclusion of procedure. Bladder capacity noted to be 550 mL and 600 mL under subsequent fillings. Some mild trabeculations were noted as well as a few glomerular hemorrhages consistent with previously  known interstitial cystitis diagnosis.   She returns today for follow-up. Painful and burning voiding have improved significantly. She also states her incontinence has improved, she is only voiding 2x/nightly presently. She still occasionally uses pyridium but not as frequently before her HOD. She denies any visible blood in the urine or trouble starting her stream. No post-operative f/c, n/v.   06/13/2020: Patient presents with one-week history of increased bladder pain and pressure. Also associated with painful and  burning urination. No associated gross hematuria. She has mixed incontinence which is stable. Nocturia continues at her baseline x3. No associated fevers or chills, lower back or flank pain/discomfort, nausea/vomiting. She denies interval treatment for UTI since last office visit. She is managing her symptoms currently with Pyridium which is only mild to moderately effective.   11/09/2020: When I saw her in September she had a positive urine culture for Klebsiella. She was treated with Macrobid but with failure of symptoms to improve Cipro was prescribed by another provider. She tells me today at that time symptoms did resolve with return to baseline symptomatology. Since then she has had intermittent flare ups of her typical presentation with increased bladder pain, painful and burning urination as well as increased nocturia and associated intermittent urge incontinence. Her flares have not been significant enough to seek treatment. However on Monday of this week she had a severe exacerbation. She started taking Cipro which she had left over from a previous prescription as 1/2 tablet daily. This has helped some with the burning but she still complains of bladder pain and urgency as well as continued nocturia and urge incontinence. Denies visible blood in the urine. She feels like she is emptying well enough without any new or worsening obstructive signs. Symptoms not associated with fevers or chills, nausea/vomiting.   01/04/21: Kaitlyn [name] returns today in f/u for bladder pain and frequency with some dysuria but no hematuria. She has nocturia x 2-3. She has some intermittency. She has no urgency. Her UA and culture and culture were negative in February and she has not required an HOD in 2 years.     ALLERGIES: Sulfa Drugs - Skin Rash    MEDICATIONS: Levothyroxine Sodium 100 mcg tablet  Depakote  Iron  Lexapro 20 mg tablet  Pyridium 200 mg tablet 1 tablet PO TID PRN  Seroquel 200 mg tablet     GU PSH:  Cystoscopy Hydrodistention - 2023, 2022, 2020, 2018, 2017, 2015, 2015, 2014, 2013, 2011, 2010, 2009 Hysterectomy Unilat SO - 2009     NON-GU PSH: Anesth, Shoulder Replacement Hip Replacement, Bilateral Remove Tonsils - 2009 Visit Complexity (formerly GPC1X) - 03/02/2023 Wrist Arthroscopy/surgery, Right     GU PMH: Interstitial Cystitis (w/o hematuria) - 04/03/2023, She is having a symptom flair. Her UA his Nit+ but she is on pyridium. I will get a culture and start Macrobid but I will also go ahead and get her scheduled for an HOD. I have reviewed this risks in detail. She will let us know if her symptoms improve with the antibiotic and we could cancel if need be. , - 03/02/2023, - 2023, - 2023, - 2022, - 2022 (Worsening), She has increased pain from her IC and feels she needs another dilation. She has actually gone a fairly long time for her. I will get her set up for the procedure but sent a culture to make sure she doesn't have infection. I reviewed the risks of the procedure in detail. , - 2018, Chronic interstitial cystitis without hematuria, -  2017, Chronic Interstitial Cystitis, - 2014 Painful micturition, Unspec - 04/03/2023, - 03/02/2023, - 2023, She has recurrent IC symptoms with a clear UA and has responded well to cystoscopy and HOD and she would like to proceed with that procedure. She is aware of the risks. I will get that set up ASAP. , - 2023, - 2022, She has recurrent IC symptoms and generally responds well to cystoscopy with HOD. I will get her scheduled and she is well aware of the risks and benefits. , - 2022, - 2022, - 2021 (Worsening), She has recurrent symptoms from her IC and would like to proceed with cystoscopy and HOD. She is well aware of the risks. , - 2020, She has symptoms that could be from cystitis or recurrent IC. I will send a culture and start macrobid pending the culture. If the culture is negative, she will probably need anesthetic cocktails or a repeat HOD. , -  2020 Acute Cystitis/UTI - 03/02/2023 Mixed incontinence - 03/02/2023, She has frequency and urgency but only mild leakage. , - 2023, - 2021 (Stable), She continues to have mild incontinence. , - 2018 Pelvic/perineal pain - 03/02/2023 Subacute and chronic vaginitis, She has vaginal itching with prior improvement with fluconazole. I have sent a script for that. She has tolerated it in the past while on her current antidepressants. - 2020 Microscopic hematuria (Stable, Chronic), minimal stable and chronic. - 2018 Other microscopic hematuria, Microscopic hematuria - 2017 Oth GU systems Signs/Symptoms, Bladder pain - 2017 Urge incontinence, Urge incontinence of urine - 2017 Urinary Retention, Unspec, Acute Urinary Retention - 2014      PMH Notes:  2006-11-06 15:32:12 - Note: Arthritis   NON-GU PMH: Encounter for general adult medical examination without abnormal findings, Encounter for preventive health examination - 2017 Anxiety, Anxiety - 2014 Personal history of other diseases of the digestive system, History of esophageal reflux - 2014 Personal history of other endocrine, nutritional and metabolic disease, History of hypercholesterolemia - 2014, History of hypothyroidism, - 2014 Personal history of other mental and behavioral disorders, History of depression - 2014    FAMILY HISTORY: Breast Cancer - Runs In Family Family Health Status Number - Runs In Family Lung Cancer - Mother, Father nephrolithiasis - Brother   SOCIAL HISTORY: Marital Status: Married Current Smoking Status: Patient smokes. Smokes 1/2 pack per day.   Tobacco Use Assessment Completed: Used Tobacco in last 30 days?    REVIEW OF SYSTEMS:    GU Review Female:   Patient reports frequent urination, burning /pain with urination, get up at night to urinate, and leakage of urine. Patient denies hard to postpone urination, stream starts and stops, trouble starting your stream, have to strain to urinate, and being pregnant.   Gastrointestinal (Upper):   Patient denies nausea, vomiting, and indigestion/ heartburn.  Gastrointestinal (Lower):   Patient denies diarrhea and constipation.  Constitutional:   Patient denies fever, night sweats, weight loss, and fatigue.  Skin:   Patient denies skin rash/ lesion and itching.  Eyes:   Patient denies blurred vision and double vision.  Ears/ Nose/ Throat:   Patient denies sore throat and sinus problems.  Hematologic/Lymphatic:   Patient denies swollen glands and easy bruising.  Cardiovascular:   Patient denies leg swelling and chest pains.  Respiratory:   Patient denies cough and shortness of breath.  Endocrine:   Patient denies excessive thirst.  Musculoskeletal:   Patient denies back pain and joint pain.  Neurological:   Patient denies headaches and dizziness.  Psychologic:   Patient denies depression and anxiety.   VITAL SIGNS: None   MULTI-SYSTEM PHYSICAL EXAMINATION:    Constitutional: Well-nourished. No physical deformities. Normally developed. Good grooming.   Respiratory: Normal breath sounds. No labored breathing, no use of accessory muscles.   Cardiovascular: Regular rate and rhythm. No murmur, no gallop.      Complexity of Data:  Records Review:   Previous Patient Records  Urine Test Review:   Urinalysis   PROCEDURES:          Visit Complexity - G2211 Chronic management         Urinalysis Dipstick Dipstick Cont'd  Color: Yellow Bilirubin: Neg mg/dL  Appearance: Clear Ketones: Neg mg/dL  Specific Gravity: 1.610 Blood: Neg ery/uL  pH: 6.5 Protein: Trace mg/dL  Glucose: Neg mg/dL Urobilinogen: 0.2 mg/dL    Nitrites: Neg    Leukocyte Esterase: Neg leu/uL    ASSESSMENT:      ICD-10 Details  1 GU:   Interstitial Cystitis (w/o hematuria) - N30.10 Chronic, Worsening - She has a flair of her IC symptoms with pain, frequency and dysuria. UA is clear. I discussed avoiding dietary irritants but will get her set up for HOD. She is aware of the risks.   2    Painful micturition, Unspec - R30.9 Chronic, Worsening  3   Pelvic/perineal pain - R10.2 Chronic, Worsening  4   Urinary Frequency - R35.0 Chronic, Worsening  5   Mixed incontinence - N39.46 Chronic, Stable   PLAN:           Schedule Return Visit/Planned Activity: Next Available Appointment - Schedule Surgery  Procedure: Unspecified Date - Cystoscopy Hydrodistention - 96045 Notes: Next available.

## 2023-08-27 ENCOUNTER — Other Ambulatory Visit: Payer: Self-pay

## 2023-08-27 ENCOUNTER — Encounter (HOSPITAL_COMMUNITY)
Admission: RE | Admit: 2023-08-27 | Discharge: 2023-08-27 | Disposition: A | Discharge status: Home / Self Care | Payer: Medicare HMO | Source: Ambulatory Visit | Attending: Urology | Admitting: Urology

## 2023-08-27 ENCOUNTER — Encounter (HOSPITAL_COMMUNITY): Payer: Self-pay

## 2023-08-27 VITALS — BP 121/75 | HR 78 | Temp 98.1°F | Resp 16 | Ht <= 58 in | Wt 126.0 lb

## 2023-08-27 DIAGNOSIS — I10 Essential (primary) hypertension: Secondary | ICD-10-CM | POA: Diagnosis not present

## 2023-08-27 DIAGNOSIS — Z01812 Encounter for preprocedural laboratory examination: Secondary | ICD-10-CM | POA: Insufficient documentation

## 2023-08-27 DIAGNOSIS — Z01818 Encounter for other preprocedural examination: Secondary | ICD-10-CM | POA: Diagnosis present

## 2023-08-27 LAB — BASIC METABOLIC PANEL
Anion gap: 7 (ref 5–15)
BUN: 25 mg/dL — ABNORMAL HIGH (ref 8–23)
CO2: 26 mmol/L (ref 22–32)
Calcium: 9.5 mg/dL (ref 8.9–10.3)
Chloride: 105 mmol/L (ref 98–111)
Creatinine, Ser: 1.62 mg/dL — ABNORMAL HIGH (ref 0.44–1.00)
GFR, Estimated: 35 mL/min — ABNORMAL LOW (ref >60)
Glucose, Bld: 96 mg/dL (ref 70–99)
Potassium: 5.7 mmol/L — ABNORMAL HIGH (ref 3.5–5.1)
Sodium: 138 mmol/L (ref 135–145)

## 2023-08-27 LAB — CBC
HCT: 42.1 % (ref 36.0–46.0)
Hemoglobin: 13.3 g/dL (ref 12.0–15.0)
MCH: 32 pg (ref 26.0–34.0)
MCHC: 31.6 g/dL (ref 30.0–36.0)
MCV: 101.4 fL — ABNORMAL HIGH (ref 80.0–100.0)
Platelets: 263 10*3/uL (ref 150–400)
RBC: 4.15 MIL/uL (ref 3.87–5.11)
RDW: 12.1 % (ref 11.5–15.5)
WBC: 8.3 10*3/uL (ref 4.0–10.5)
nRBC: 0 % (ref 0.0–0.2)

## 2023-08-27 NOTE — Anesthesia Preprocedure Evaluation (Addendum)
Anesthesia Evaluation  Patient identified by MRN, date of birth, ID band Patient awake    Reviewed: Allergy & Precautions, NPO status , Patient's Chart, lab work & pertinent test results  History of Anesthesia Complications (+) PONV and history of anesthetic complications  Airway Mallampati: II  TM Distance: >3 FB Neck ROM: Full    Dental  (+) Dental Advisory Given, Edentulous Upper, Missing   Pulmonary Current Smoker and Patient abstained from smoking.   Pulmonary exam normal breath sounds clear to auscultation       Cardiovascular hypertension, Pt. on medications Normal cardiovascular exam Rhythm:Regular Rate:Normal     Neuro/Psych  Headaches PSYCHIATRIC DISORDERS Anxiety Depression       GI/Hepatic Neg liver ROS,GERD  Medicated and Controlled,,  Endo/Other  Hypothyroidism    Renal/GU ARF and Renal InsufficiencyRenal disease   Interstitial cystitis     Musculoskeletal  (+) Arthritis ,    Abdominal   Peds  Hematology  (+) Blood dyscrasia, anemia   Anesthesia Other Findings   Reproductive/Obstetrics                             Anesthesia Physical Anesthesia Plan  ASA: 3  Anesthesia Plan: General   Post-op Pain Management: Tylenol PO (pre-op)*   Induction: Intravenous  PONV Risk Score and Plan: 4 or greater and Midazolam, Dexamethasone and Ondansetron  Airway Management Planned: LMA  Additional Equipment: None  Intra-op Plan:   Post-operative Plan: Extubation in OR  Informed Consent: I have reviewed the patients History and Physical, chart, labs and discussed the procedure including the risks, benefits and alternatives for the proposed anesthesia with the patient or authorized representative who has indicated his/her understanding and acceptance.     Dental advisory given  Plan Discussed with: CRNA  Anesthesia Plan Comments:         Anesthesia Quick  Evaluation

## 2023-08-27 NOTE — Progress Notes (Signed)
Please review pre op BMP from PST visit 08/27/23.

## 2023-08-28 ENCOUNTER — Ambulatory Visit (HOSPITAL_BASED_OUTPATIENT_CLINIC_OR_DEPARTMENT_OTHER): Payer: Medicare HMO

## 2023-08-28 ENCOUNTER — Ambulatory Visit (HOSPITAL_COMMUNITY)
Admission: RE | Admit: 2023-08-28 | Discharge: 2023-08-28 | Disposition: A | Discharge status: Home / Self Care | Payer: Medicare HMO | Attending: Urology | Admitting: Urology

## 2023-08-28 ENCOUNTER — Encounter (HOSPITAL_COMMUNITY)
Admission: RE | Disposition: A | Discharge status: Home / Self Care | Payer: Self-pay | Source: Home / Self Care | Attending: Urology

## 2023-08-28 ENCOUNTER — Encounter (HOSPITAL_COMMUNITY): Payer: Self-pay | Admitting: Urology

## 2023-08-28 ENCOUNTER — Ambulatory Visit (HOSPITAL_COMMUNITY): Payer: Medicare HMO | Admitting: Physician Assistant

## 2023-08-28 DIAGNOSIS — N3010 Interstitial cystitis (chronic) without hematuria: Secondary | ICD-10-CM | POA: Diagnosis present

## 2023-08-28 DIAGNOSIS — F17210 Nicotine dependence, cigarettes, uncomplicated: Secondary | ICD-10-CM | POA: Insufficient documentation

## 2023-08-28 HISTORY — PX: CYSTO WITH HYDRODISTENSION: SHX5453

## 2023-08-28 LAB — POCT I-STAT, CHEM 8
BUN: 27 mg/dL — ABNORMAL HIGH (ref 8–23)
Calcium, Ion: 1.19 mmol/L (ref 1.15–1.40)
Chloride: 106 mmol/L (ref 98–111)
Creatinine, Ser: 1.7 mg/dL — ABNORMAL HIGH (ref 0.44–1.00)
Glucose, Bld: 95 mg/dL (ref 70–99)
HCT: 38 % (ref 36.0–46.0)
Hemoglobin: 12.9 g/dL (ref 12.0–15.0)
Potassium: 5.3 mmol/L — ABNORMAL HIGH (ref 3.5–5.1)
Sodium: 141 mmol/L (ref 135–145)
TCO2: 25 mmol/L (ref 22–32)

## 2023-08-28 SURGERY — CYSTOSCOPY, WITH BLADDER HYDRODISTENSION
Anesthesia: General

## 2023-08-28 MED ORDER — MORPHINE SULFATE (PF) 2 MG/ML IV SOLN: 2.0000 mg | INTRAVENOUS | Status: DC | PRN

## 2023-08-28 MED ORDER — DROPERIDOL 2.5 MG/ML IJ SOLN: 0.6250 mg | Freq: Once | INTRAMUSCULAR | Status: DC | PRN

## 2023-08-28 MED ORDER — MIDAZOLAM HCL 5 MG/5ML IJ SOLN
INTRAMUSCULAR | Status: DC | PRN
  Administered 2023-08-28: 2 mg via INTRAVENOUS

## 2023-08-28 MED ORDER — BUPIVACAINE HCL (PF) 0.5 % IJ SOLN
INTRAMUSCULAR | Status: AC
  Filled 2023-08-28: qty 30

## 2023-08-28 MED ORDER — BUPIVACAINE HCL (PF) 0.5 % IJ SOLN
INTRAMUSCULAR | Status: DC | PRN
  Administered 2023-08-28: 30 mL

## 2023-08-28 MED ORDER — CEFAZOLIN SODIUM-DEXTROSE 1-4 GM/50ML-% IV SOLN
INTRAVENOUS | Status: DC | PRN
  Administered 2023-08-28: 2 g via INTRAVENOUS

## 2023-08-28 MED ORDER — FENTANYL CITRATE PF 50 MCG/ML IJ SOSY: 25.0000 ug | PREFILLED_SYRINGE | INTRAMUSCULAR | Status: DC | PRN

## 2023-08-28 MED ORDER — DIATRIZOATE MEGLUMINE 30 % UR SOLN
URETHRAL | Status: AC
  Filled 2023-08-28: qty 300

## 2023-08-28 MED ORDER — SODIUM CHLORIDE 0.9% FLUSH: 3.0000 mL | INTRAVENOUS | Status: DC | PRN

## 2023-08-28 MED ORDER — ONDANSETRON HCL 4 MG/2ML IJ SOLN
INTRAMUSCULAR | Status: DC | PRN
  Administered 2023-08-28: 4 mg via INTRAVENOUS

## 2023-08-28 MED ORDER — ACETAMINOPHEN 325 MG PO TABS: 650.0000 mg | ORAL_TABLET | ORAL | Status: DC | PRN

## 2023-08-28 MED ORDER — PROPOFOL 10 MG/ML IV BOLUS
INTRAVENOUS | Status: DC | PRN
  Administered 2023-08-28: 90 mg via INTRAVENOUS

## 2023-08-28 MED ORDER — PROPOFOL 10 MG/ML IV BOLUS
INTRAVENOUS | Status: AC
  Filled 2023-08-28: qty 20

## 2023-08-28 MED ORDER — SODIUM CHLORIDE 0.9 % IV SOLN: 250.0000 mL | INTRAVENOUS | Status: DC | PRN

## 2023-08-28 MED ORDER — DEXAMETHASONE SODIUM PHOSPHATE 10 MG/ML IJ SOLN
INTRAMUSCULAR | Status: DC | PRN
  Administered 2023-08-28: 5 mg via INTRAVENOUS

## 2023-08-28 MED ORDER — SODIUM CHLORIDE 0.9% FLUSH: 3.0000 mL | Freq: Two times a day (BID) | INTRAVENOUS | Status: DC

## 2023-08-28 MED ORDER — CHLORHEXIDINE GLUCONATE 0.12 % MT SOLN
15.0000 mL | Freq: Once | OROMUCOSAL | Status: AC
  Administered 2023-08-28: 15 mL via OROMUCOSAL

## 2023-08-28 MED ORDER — LIDOCAINE HCL (PF) 2 % IJ SOLN
INTRAMUSCULAR | Status: AC
  Filled 2023-08-28: qty 5

## 2023-08-28 MED ORDER — FENTANYL CITRATE (PF) 100 MCG/2ML IJ SOLN
INTRAMUSCULAR | Status: AC
  Filled 2023-08-28: qty 2

## 2023-08-28 MED ORDER — LIDOCAINE HCL (CARDIAC) PF 100 MG/5ML IV SOSY
PREFILLED_SYRINGE | INTRAVENOUS | Status: DC | PRN
  Administered 2023-08-28: 100 mg via INTRAVENOUS

## 2023-08-28 MED ORDER — STERILE WATER FOR IRRIGATION IR SOLN
Status: DC | PRN
  Administered 2023-08-28: 3000 mL

## 2023-08-28 MED ORDER — ACETAMINOPHEN 500 MG PO TABS
1000.0000 mg | ORAL_TABLET | Freq: Once | ORAL | Status: AC
  Administered 2023-08-28: 1000 mg via ORAL
  Filled 2023-08-28: qty 2

## 2023-08-28 MED ORDER — PHENYLEPHRINE 80 MCG/ML (10ML) SYRINGE FOR IV PUSH (FOR BLOOD PRESSURE SUPPORT)
PREFILLED_SYRINGE | INTRAVENOUS | Status: DC | PRN
  Administered 2023-08-28 (×2): 80 ug via INTRAVENOUS

## 2023-08-28 MED ORDER — HYDROCODONE-ACETAMINOPHEN 5-325 MG PO TABS: 1.0000 | ORAL_TABLET | Freq: Four times a day (QID) | ORAL | 0 refills | Status: AC | PRN

## 2023-08-28 MED ORDER — FENTANYL CITRATE (PF) 100 MCG/2ML IJ SOLN
INTRAMUSCULAR | Status: DC | PRN
  Administered 2023-08-28 (×2): 25 ug via INTRAVENOUS

## 2023-08-28 MED ORDER — OXYCODONE HCL 5 MG PO TABS: 5.0000 mg | ORAL_TABLET | ORAL | Status: DC | PRN

## 2023-08-28 MED ORDER — ONDANSETRON HCL 4 MG/2ML IJ SOLN
INTRAMUSCULAR | Status: AC
  Filled 2023-08-28: qty 2

## 2023-08-28 MED ORDER — MIDAZOLAM HCL 2 MG/2ML IJ SOLN
INTRAMUSCULAR | Status: AC
Start: 2023-08-28 — End: ?
  Filled 2023-08-28: qty 2

## 2023-08-28 MED ORDER — CEFAZOLIN SODIUM 1 G IJ SOLR
INTRAMUSCULAR | Status: AC
  Filled 2023-08-28: qty 20

## 2023-08-28 MED ORDER — ORAL CARE MOUTH RINSE: 15.0000 mL | Freq: Once | OROMUCOSAL | Status: AC

## 2023-08-28 MED ORDER — PHENYLEPHRINE 80 MCG/ML (10ML) SYRINGE FOR IV PUSH (FOR BLOOD PRESSURE SUPPORT)
PREFILLED_SYRINGE | INTRAVENOUS | Status: AC
  Filled 2023-08-28: qty 10

## 2023-08-28 MED ORDER — LACTATED RINGERS IV SOLN: INTRAVENOUS | Status: DC

## 2023-08-28 MED ORDER — ACETAMINOPHEN 650 MG RE SUPP: 650.0000 mg | RECTAL | Status: DC | PRN

## 2023-08-28 SURGICAL SUPPLY — 17 items
BAG URO CATCHER STRL LF (MISCELLANEOUS) ×1 IMPLANT
CATH FOLEY 2WAY 5CC 20FR (CATHETERS) ×1 IMPLANT
CATH ROBINSON RED A/P 16FR (CATHETERS) ×1 IMPLANT
CATH ROBINSON RED A/P 18FR (CATHETERS) IMPLANT
CATH URETL OPEN 5X70 (CATHETERS) IMPLANT
GLOVE SURG SS PI 8.0 STRL IVOR (GLOVE) IMPLANT
GOWN STRL REUS W/ TWL XL LVL3 (GOWN DISPOSABLE) ×1 IMPLANT
KIT TURNOVER KIT A (KITS) IMPLANT
MANIFOLD NEPTUNE II (INSTRUMENTS) ×1 IMPLANT
NDL HYPO 22X1.5 SAFETY MO (MISCELLANEOUS) IMPLANT
NDL SAFETY ECLIPSE 18X1.5 (NEEDLE) IMPLANT
NEEDLE HYPO 22X1.5 SAFETY MO (MISCELLANEOUS)
PACK CYSTO (CUSTOM PROCEDURE TRAY) ×1 IMPLANT
PLUG CATH AND CAP STRL 200 (CATHETERS) ×1 IMPLANT
SYR BULB IRRIG 60ML STRL (SYRINGE) IMPLANT
TUBING CONNECTING 10 (TUBING) IMPLANT
WATER STERILE IRR 1000ML POUR (IV SOLUTION) IMPLANT

## 2023-08-28 NOTE — Interval H&P Note (Signed)
History and Physical Interval Note:  08/28/2023 8:48 AM  Kaitlyn [name]  has presented today for surgery, with the diagnosis of Interstitial Cystitis.  The various methods of treatment have been discussed with the patient and family. After consideration of risks, benefits and other options for treatment, the patient has consented to  Procedure(s) with comments: CYSTOSCOPY/HYDRODISTENSION with MARCAINE (N/A) - 30 MINUTE CASE as a surgical intervention.  The patient's history has been reviewed, patient examined, no change in status, stable for surgery.  I have reviewed the patient's chart and labs.  Questions were answered to the patient's satisfaction.     Bjorn Pippin

## 2023-08-28 NOTE — Anesthesia Postprocedure Evaluation (Signed)
Anesthesia Post Note  Patient: Kaitlyn [name]  Procedure(s) Performed: CYSTOSCOPY/HYDRODISTENSION with MARCAINE     Patient location during evaluation: PACU Anesthesia Type: General Level of consciousness: sedated and patient cooperative Pain management: pain level controlled Vital Signs Assessment: post-procedure vital signs reviewed and stable Respiratory status: spontaneous breathing Cardiovascular status: stable Anesthetic complications: no   No notable events documented.  Last Vitals:  Vitals:   08/28/23 1205 08/28/23 1213  BP: 99/61 118/64  Pulse: 81 83  Resp: 13 14  Temp:  36.7 C  SpO2: 93% 93%    Last Pain:  Vitals:   08/28/23 1213  TempSrc:   PainSc: 0-No pain                 Lewie Loron

## 2023-08-28 NOTE — Op Note (Signed)
Procedure: Cystoscopy with hydrodistention of the bladder, instillation of Marcaine.   Preop diagnosis: Interstitial cystitis.   Postop diagnosis: Same.   Surgeon: Dr. Bjorn Pippin.   Anesthesia: General.   Specimen: None.   Drains: None.   EBL: None.   Complications: None.   Indications: The patient is a 67 year old female with a long history of interstitial cystitis has had increased symptoms recently and has elected to undergo hydrodistention of the bladder.   Procedure: She was taken the operating room where general anesthetic was induced.  She was given antibiotics.  She was placed in lithotomy position and fitted with PAS hose.  Her perineum and genitalia were prepped with Betadine solution she was draped in usual sterile fashion.   Cystoscopy was performed using the 21 [place] scope and 30 degree lens.  Examination revealed a normal urethra.  The bladder wall had mild trabeculation without tumors, stones or inflammation.  Ureteral orifices were unremarkable.   She was then filled to capacity at 80 centimeters of water pressure and the bladder was then drained.  She held 600 mL of fluid a capacity.  After decompressing the bladder she was noted to have diffuse glomerulations consistent with a diagnosis of interstitial cystitis with some hematuria.   The cystoscope was removed and a 14 red rubber catheter was inserted.   The bladder was drain and then instilled with 15 mL of 0.5% Marcaine. the catheter was removed.  She was taken down from lithotomy position, her anesthetic was reversed and she was moved to recovery room in stable condition.  There were no complications.

## 2023-08-28 NOTE — Anesthesia Procedure Notes (Signed)
Procedure Name: LMA Insertion Date/Time: 08/28/2023 9:50 AM  Performed by: Sampson Goon, CRNAPre-anesthesia Checklist: Patient identified, Emergency Drugs available, Suction available and Patient being monitored Patient Re-evaluated:Patient Re-evaluated prior to induction Oxygen Delivery Method: Circle System Utilized Preoxygenation: Pre-oxygenation with 100% oxygen Induction Type: IV induction LMA: LMA inserted LMA Size: 3.0 Number of attempts: 1 Placement Confirmation: positive ETCO2 Tube secured with: Tape Dental Injury: Teeth and Oropharynx as per pre-operative assessment

## 2023-08-28 NOTE — Discharge Instructions (Signed)
CYSTOSCOPY HOME CARE INSTRUCTIONS ° °Activity: °Rest for the remainder of the day.  Do not drive or operate equipment today.  You may resume normal activities in one to two days as instructed by your physician.  ° °Meals: °Drink plenty of liquids and eat light foods such as gelatin or soup this evening.  You may return to a normal meal plan tomorrow. ° °Return to Work: °You may return to work in one to two days or as instructed by your physician. ° °Special Instructions / Symptoms: °Call your physician if any of these symptoms occur: ° ° -persistent or heavy bleeding ° -bleeding which continues after first few urination ° -large blood clots that are difficult to pass ° -urine stream diminishes or stops completely ° -fever equal to or higher than 101 degrees Farenheit. ° -cloudy urine with a strong, foul odor ° -severe pain ° °Females should always wipe from front to back after elimination.  You may feel some burning pain when you urinate.  This should disappear with time.  Applying moist heat to the lower abdomen or a hot tub bath may help relieve the pain. \ ° ° °Patient Signature:  ________________________________________________________ ° °Nurse's Signature:  ________________________________________________________ ° °

## 2023-08-28 NOTE — Transfer of Care (Signed)
Immediate Anesthesia Transfer of Care Note  Patient: Kaitlyn [name]  Procedure(s) Performed: CYSTOSCOPY/HYDRODISTENSION with MARCAINE  Patient Location: PACU  Anesthesia Type:General  Level of Consciousness: drowsy  Airway & Oxygen Therapy: Patient Spontanous Breathing and Patient connected to nasal cannula oxygen  Post-op Assessment: Report given to RN and Post -op Vital signs reviewed and stable  Post vital signs: Reviewed and stable  Last Vitals:  Vitals Value Taken Time  BP 94/58 08/28/23 1016  Temp    Pulse 73 08/28/23 1018  Resp 11 08/28/23 1018  SpO2 99 % 08/28/23 1018  Vitals shown include unfiled device data.  Last Pain:  Vitals:   08/28/23 0718  TempSrc: Oral  PainSc: 4       Patients Stated Pain Goal: 0 (08/28/23 0718)  Complications: No notable events documented.

## 2023-08-29 ENCOUNTER — Encounter (HOSPITAL_COMMUNITY): Payer: Self-pay | Admitting: Urology

## 2024-08-17 ENCOUNTER — Other Ambulatory Visit: Payer: Self-pay | Admitting: Urology

## 2024-08-19 MED ORDER — PHENAZOPYRIDINE HCL 100 MG PO TABS: 400.0000 mg | ORAL_TABLET | Freq: Once | ORAL | Status: AC

## 2024-08-19 MED ORDER — BUPIVACAINE HCL (PF) 0.5 % IJ SOLN: 15.0000 mL | Freq: Once | INTRAMUSCULAR | Status: AC

## 2024-08-24 NOTE — Progress Notes (Signed)
Pt here for ua only

## 2024-08-26 ENCOUNTER — Encounter (HOSPITAL_COMMUNITY): Payer: Self-pay | Admitting: Urology

## 2024-08-26 NOTE — Progress Notes (Signed)
 Spoke w/ via phone for pre-op interview--- [facility] needs dos----  BMP and CBC per anesthesia.       Lab results------ COVID test -----patient states asymptomatic no test needed Arrive at -------0930 NPO after MN NO Solid Food.  Clear liquids from MN until---0830 Pre-Surgery Ensure or G2:  Med rec completed Medications to take morning of surgery -----Norvasc , Depakote , Levothyroxine , Protonix . Vicodin, Ativan  and Maxalt PRN.  Diabetic medication -----  GLP1 agonist last dose: GLP1 instructions:  Patient instructed no nail polish to be worn day of surgery Patient instructed to bring photo id and insurance card day of surgery Patient aware to have Driver (ride ) / caregiver    for 24 hours after surgery - Husband Ray Trembath Patient Special Instructions ----- Pre-Op special Instructions -----  Patient verbalized understanding of instructions that were given at this phone interview. Patient denies chest pain, sob, fever, cough at the interview.

## 2024-09-02 ENCOUNTER — Ambulatory Visit (HOSPITAL_COMMUNITY)

## 2024-09-02 ENCOUNTER — Other Ambulatory Visit: Payer: Self-pay

## 2024-09-02 ENCOUNTER — Ambulatory Visit (HOSPITAL_COMMUNITY)
Admission: RE | Admit: 2024-09-02 | Discharge: 2024-09-02 | Disposition: A | Discharge status: Home / Self Care | Attending: Urology | Admitting: Urology

## 2024-09-02 ENCOUNTER — Encounter: Admission: RE | Disposition: A | Payer: Self-pay | Attending: Urology

## 2024-09-02 ENCOUNTER — Encounter (HOSPITAL_COMMUNITY): Payer: Self-pay | Admitting: Urology

## 2024-09-02 DIAGNOSIS — I10 Essential (primary) hypertension: Secondary | ICD-10-CM | POA: Diagnosis not present

## 2024-09-02 DIAGNOSIS — F419 Anxiety disorder, unspecified: Secondary | ICD-10-CM

## 2024-09-02 DIAGNOSIS — N3010 Interstitial cystitis (chronic) without hematuria: Secondary | ICD-10-CM | POA: Diagnosis present

## 2024-09-02 DIAGNOSIS — Z01818 Encounter for other preprocedural examination: Secondary | ICD-10-CM

## 2024-09-02 DIAGNOSIS — F17200 Nicotine dependence, unspecified, uncomplicated: Secondary | ICD-10-CM | POA: Diagnosis not present

## 2024-09-02 DIAGNOSIS — K219 Gastro-esophageal reflux disease without esophagitis: Secondary | ICD-10-CM | POA: Diagnosis not present

## 2024-09-02 DIAGNOSIS — E039 Hypothyroidism, unspecified: Secondary | ICD-10-CM | POA: Insufficient documentation

## 2024-09-02 DIAGNOSIS — F17210 Nicotine dependence, cigarettes, uncomplicated: Secondary | ICD-10-CM

## 2024-09-02 HISTORY — PX: CYSTO WITH HYDRODISTENSION: SHX5453

## 2024-09-02 LAB — BASIC METABOLIC PANEL WITH GFR
Anion gap: 10 (ref 5–15)
BUN: 25 mg/dL — ABNORMAL HIGH (ref 8–23)
CO2: 25 mmol/L (ref 22–32)
Calcium: 9.4 mg/dL (ref 8.9–10.3)
Chloride: 106 mmol/L (ref 98–111)
Creatinine, Ser: 1.56 mg/dL — ABNORMAL HIGH (ref 0.44–1.00)
GFR, Estimated: 36 mL/min — ABNORMAL LOW
Glucose, Bld: 91 mg/dL (ref 70–99)
Potassium: 4.3 mmol/L (ref 3.5–5.1)
Sodium: 141 mmol/L (ref 135–145)

## 2024-09-02 LAB — CBC
HCT: 39.6 % (ref 36.0–46.0)
Hemoglobin: 13.1 g/dL (ref 12.0–15.0)
MCH: 33.5 pg (ref 26.0–34.0)
MCHC: 33.1 g/dL (ref 30.0–36.0)
MCV: 101.3 fL — ABNORMAL HIGH (ref 80.0–100.0)
Platelets: 200 K/uL (ref 150–400)
RBC: 3.91 MIL/uL (ref 3.87–5.11)
RDW: 13.3 % (ref 11.5–15.5)
WBC: 7.7 K/uL (ref 4.0–10.5)
nRBC: 0 % (ref 0.0–0.2)

## 2024-09-02 SURGERY — CYSTOSCOPY, WITH BLADDER HYDRODISTENSION
Anesthesia: General | Site: Bladder

## 2024-09-02 MED ORDER — PHENAZOPYRIDINE HCL 200 MG PO TABS
Freq: Once | ORAL | Status: AC
  Administered 2024-09-02: 15 mL via INTRAVESICAL
  Filled 2024-09-02: qty 15

## 2024-09-02 MED ORDER — HYDROCODONE-ACETAMINOPHEN 5-325 MG PO TABS: 1.0000 | ORAL_TABLET | Freq: Four times a day (QID) | ORAL | 0 refills | Status: AC | PRN

## 2024-09-02 MED ORDER — PROPOFOL 10 MG/ML IV BOLUS
INTRAVENOUS | Status: AC
  Filled 2024-09-02: qty 20

## 2024-09-02 MED ORDER — CHLORHEXIDINE GLUCONATE 0.12 % MT SOLN
OROMUCOSAL | Status: AC
  Filled 2024-09-02: qty 15

## 2024-09-02 MED ORDER — FENTANYL CITRATE (PF) 100 MCG/2ML IJ SOLN
INTRAMUSCULAR | Status: AC
  Filled 2024-09-02: qty 2

## 2024-09-02 MED ORDER — SODIUM CHLORIDE 0.9% FLUSH: 3.0000 mL | Freq: Two times a day (BID) | INTRAVENOUS | Status: DC

## 2024-09-02 MED ORDER — LIDOCAINE 2% (20 MG/ML) 5 ML SYRINGE
INTRAMUSCULAR | Status: AC
  Filled 2024-09-02: qty 5

## 2024-09-02 MED ORDER — PHENYLEPHRINE 80 MCG/ML (10ML) SYRINGE FOR IV PUSH (FOR BLOOD PRESSURE SUPPORT)
PREFILLED_SYRINGE | INTRAVENOUS | Status: AC
  Filled 2024-09-02: qty 10

## 2024-09-02 MED ORDER — PROPOFOL 10 MG/ML IV BOLUS
INTRAVENOUS | Status: DC | PRN
  Administered 2024-09-02: 100 mg via INTRAVENOUS
  Administered 2024-09-02: 20 mg via INTRAVENOUS
  Administered 2024-09-02: 50 mg via INTRAVENOUS

## 2024-09-02 MED ORDER — STERILE WATER FOR IRRIGATION IR SOLN
Status: DC | PRN
  Administered 2024-09-02: 3000 mL

## 2024-09-02 MED ORDER — DEXAMETHASONE SOD PHOSPHATE PF 10 MG/ML IJ SOLN
INTRAMUSCULAR | Status: DC | PRN
  Administered 2024-09-02: 5 mg via INTRAVENOUS

## 2024-09-02 MED ORDER — ONDANSETRON HCL 4 MG/2ML IJ SOLN
INTRAMUSCULAR | Status: DC | PRN
  Administered 2024-09-02: 4 mg via INTRAVENOUS

## 2024-09-02 MED ORDER — CEFAZOLIN SODIUM-DEXTROSE 2-4 GM/100ML-% IV SOLN
2.0000 g | INTRAVENOUS | Status: AC
  Administered 2024-09-02: 2 g via INTRAVENOUS

## 2024-09-02 MED ORDER — LIDOCAINE 2% (20 MG/ML) 5 ML SYRINGE
INTRAMUSCULAR | Status: DC | PRN
  Administered 2024-09-02: 100 mg via INTRAVENOUS

## 2024-09-02 MED ORDER — FENTANYL CITRATE (PF) 250 MCG/5ML IJ SOLN
INTRAMUSCULAR | Status: DC | PRN
  Administered 2024-09-02: 25 ug via INTRAVENOUS
  Administered 2024-09-02: 50 ug via INTRAVENOUS

## 2024-09-02 MED ORDER — PHENYLEPHRINE 80 MCG/ML (10ML) SYRINGE FOR IV PUSH (FOR BLOOD PRESSURE SUPPORT)
PREFILLED_SYRINGE | INTRAVENOUS | Status: DC | PRN
  Administered 2024-09-02: 40 ug via INTRAVENOUS
  Administered 2024-09-02 (×2): 80 ug via INTRAVENOUS

## 2024-09-02 MED ORDER — ORAL CARE MOUTH RINSE: 15.0000 mL | Freq: Once | OROMUCOSAL | Status: AC

## 2024-09-02 MED ORDER — CHLORHEXIDINE GLUCONATE 0.12 % MT SOLN
15.0000 mL | Freq: Once | OROMUCOSAL | Status: AC
  Administered 2024-09-02: 15 mL via OROMUCOSAL

## 2024-09-02 MED ORDER — CEFAZOLIN SODIUM-DEXTROSE 2-4 GM/100ML-% IV SOLN
INTRAVENOUS | Status: AC
  Filled 2024-09-02: qty 100

## 2024-09-02 MED ORDER — LACTATED RINGERS IV SOLN: INTRAVENOUS | Status: DC

## 2024-09-02 SURGICAL SUPPLY — 21 items
BAG COUNTER SPONGE SURGICOUNT (BAG) ×2 IMPLANT
BAG DRAIN URO-CYSTO SKYTR STRL (DRAIN) ×1 IMPLANT
DRAPE CAMERA CLOSED 9X96 (DRAPES) ×1 IMPLANT
ELECTRODE REM PT RTRN 9FT ADLT (ELECTROSURGICAL) ×1 IMPLANT
GLOVE BIO SURGEON STRL SZ7.5 (GLOVE) ×1 IMPLANT
GOWN STRL REUS W/ TWL LRG LVL3 (GOWN DISPOSABLE) ×1 IMPLANT
GOWN STRL REUS W/ TWL XL LVL3 (GOWN DISPOSABLE) ×1 IMPLANT
KIT TURNOVER KIT B (KITS) ×2 IMPLANT
MANIFOLD NEPTUNE II (INSTRUMENTS) ×1 IMPLANT
NDL 18GX1X1/2 (RX/OR ONLY) (NEEDLE) ×1 IMPLANT
NDL HYPO 22X1.5 SAFETY MO (MISCELLANEOUS) ×1 IMPLANT
NEEDLE 18GX1X1/2 (RX/OR ONLY) (NEEDLE) ×1 IMPLANT
NEEDLE HYPO 22X1.5 SAFETY MO (MISCELLANEOUS) ×1 IMPLANT
PACK CYSTO (CUSTOM PROCEDURE TRAY) ×1 IMPLANT
PAD ARMBOARD POSITIONER FOAM (MISCELLANEOUS) ×2 IMPLANT
SET IRRIG Y-TYPE CYSTO (SET/KITS/TRAYS/PACK) ×1 IMPLANT
SOL .9 NS 3000ML IRR UROMATIC (IV SOLUTION) ×1 IMPLANT
SOL PREP POV-IOD 4OZ 10% (MISCELLANEOUS) ×1 IMPLANT
SYR 20ML LL LF (SYRINGE) ×1 IMPLANT
TUBE CONNECTING 12X1/4 (SUCTIONS) ×1 IMPLANT
WATER STERILE IRR 3000ML UROMA (IV SOLUTION) ×1 IMPLANT

## 2024-09-02 NOTE — Interval H&P Note (Signed)
 History and Physical Interval Note:  She is currently on nitrofurantoin for a UTI.   Her symptoms have improved.  09/02/2024 10:41 AM  Kaitlyn [name]  has presented today for surgery, with the diagnosis of INTERSTITIAL CYSTITIS.  The various methods of treatment have been discussed with the patient and family. After consideration of risks, benefits and other options for treatment, the patient has consented to  Procedures: CYSTOSCOPY, WITH BLADDER HYDRODISTENSION (N/A) as a surgical intervention.  The patient's history has been reviewed, patient examined, no change in status, stable for surgery.  I have reviewed the patient's chart and labs.  Questions were answered to the patient's satisfaction.     [name]

## 2024-09-02 NOTE — Transfer of Care (Signed)
 Immediate Anesthesia Transfer of Care Note  Patient: Kaitlyn [name]  Procedure(s) Performed: CYSTOSCOPY, WITH BLADDER HYDRODISTENSION (Bladder)  Patient Location: PACU  Anesthesia Type:General  Level of Consciousness: awake, alert , oriented, and patient cooperative  Airway & Oxygen Therapy: Patient Spontanous Breathing and Patient connected to face mask oxygen  Post-op Assessment: Report given to RN and Post -op Vital signs reviewed and stable  Post vital signs: Reviewed and stable  Last Vitals:  Vitals Value Taken Time  BP 112/64 09/02/24 11:40  Temp 36.6 C 09/02/24 11:40  Pulse 82 09/02/24 11:44  Resp 12 09/02/24 11:44  SpO2 95 % 09/02/24 11:44  Vitals shown include unfiled device data.  Last Pain:  Vitals:   09/02/24 0930  TempSrc: Oral  PainSc: 4       Patients Stated Pain Goal: 4 (09/02/24 0930)  Complications: There were no known notable events for this encounter.

## 2024-09-02 NOTE — Discharge Instructions (Addendum)
 CYSTOSCOPY HOME CARE INSTRUCTIONS  Activity: Rest for the remainder of the day.  Do not drive or operate equipment today.  You may resume normal activities in one to two days as instructed by your physician.   Meals: Drink plenty of liquids and eat light foods such as gelatin or soup this evening.  You may return to a normal meal plan tomorrow.  Return to Work: You may return to work in one to two days or as instructed by your physician.  Special Instructions / Symptoms: Call your physician if any of these symptoms occur:   -persistent or heavy bleeding  -bleeding which continues after first few urination  -large blood clots that are difficult to pass  -urine stream diminishes or stops completely  -fever equal to or higher than 101 degrees Farenheit.  -cloudy urine with a strong, foul odor  -severe pain  Females should always wipe from front to back after elimination.  You may feel some burning pain when you urinate.  This should disappear with time.  Applying moist heat to the lower abdomen or a hot tub bath may help relieve the pain. \   Patient Signature:  ________________________________________________________  Nurse's Signature:  ________________________________________________________

## 2024-09-02 NOTE — Op Note (Signed)
 Procedure: Cystoscopy with hydrodistention of bladder and instillation of Pyridium  and Marcaine .  Pre-op diagnosis: Interstitial cystitis.  Postop diagnosis: Same.  Surgeon: Dr. Norleen Seltzer.  Anesthesia: General.  Specimen: Urine culture.  Drain: None.  EBL: None.  Complications: None.  Indications: The patient is a 68 year old female with a long history of interstitial cystitis who requires intermittent hydrodistention of bladder for symptom relief.  She reported in the holding area that she is currently under treatment for urinary tract infection with nitrofurantoin and has had about 4 days of her force with relief of symptoms.  Was felt to be safe for hydrodistention.  Procedure: She was taken the operating room where she was given Ancef .  General anesthetic was induced.  She was placed in lithotomy position and food PSIs.  Her perineum and genitalia were prepped Betadine  solution she was draped in usual sterile fashion.  The 21 French cystoscope with 30 degree lens was inserted and the bladder was drained and a specimen was obtained to send for culture.  Cystoscopy demonstrated a normal urethra.  The bladder wall was pale with mild trabeculation but no mucosal lesions.  Ureteral orifices were unremarkable.  Her bladder was then dilated to capacity at 80 cm of water  pressure and this was held for approximately a minute and then the bladder was drained.  Her capacity under anesthesia was 500 mL.  There were glomerulations of the bladder wall consistent with her diagnosis of interstitial cystitis noted on the posterior wall and trigone.  Her bladder was then drained and a red rubber catheter was placed and she was instilled with 15 mL of half percent Marcaine  with 400 mg of crushed Pyridium .  The catheter was removed.  She was taken down from lithotomy position, her anesthetic was reversed and she was moved to recovery in stable condition.  There were no complications.

## 2024-09-02 NOTE — Anesthesia Preprocedure Evaluation (Addendum)
"                                    Anesthesia Evaluation  Patient identified by MRN, date of birth, ID band Patient awake    Reviewed: Allergy & Precautions, NPO status , Patient's Chart, lab work & pertinent test results  History of Anesthesia Complications (+) PONV and history of anesthetic complications  Airway Mallampati: I  TM Distance: >3 FB Neck ROM: Full    Dental  (+) Edentulous Upper, Dental Advisory Given   Pulmonary Current Smoker   breath sounds clear to auscultation       Cardiovascular hypertension,  Rhythm:Regular Rate:Normal  TTE (2013): - Left ventricle: The cavity size was normal. Wall thickness    was increased in a pattern of mild LVH. Systolic function    was normal. The estimated ejection fraction was in the    range of 55% to 65%. Wall motion was normal; there were no    regional wall motion abnormalities. Doppler parameters are    consistent with abnormal left ventricular relaxation    (grade 1 diastolic dysfunction).  - Aortic valve: Mild regurgitation.  - Atrial septum: No defect or patent foramen ovale was    identified.     Neuro/Psych  Headaches PSYCHIATRIC DISORDERS Anxiety        GI/Hepatic ,GERD  Medicated and Controlled,,  Endo/Other  Hypothyroidism    Renal/GU Renal InsufficiencyRenal disease   Interstitial Cystitis    Musculoskeletal   Abdominal   Peds  Hematology  (+) Blood dyscrasia, anemia   Anesthesia Other Findings   Reproductive/Obstetrics                              Anesthesia Physical Anesthesia Plan  ASA: 3  Anesthesia Plan: General   Post-op Pain Management:    Induction: Intravenous  PONV Risk Score and Plan: 3 and Treatment may vary due to age or medical condition, Ondansetron  and Dexamethasone   Airway Management Planned: LMA  Additional Equipment: None  Intra-op Plan:   Post-operative Plan: Extubation in OR  Informed Consent: I have reviewed the  patients History and Physical, chart, labs and discussed the procedure including the risks, benefits and alternatives for the proposed anesthesia with the patient or authorized representative who has indicated his/her understanding and acceptance.     Dental advisory given  Plan Discussed with: Surgeon  Anesthesia Plan Comments:          Anesthesia Quick Evaluation  "

## 2024-09-02 NOTE — Anesthesia Postprocedure Evaluation (Signed)
"   Anesthesia Post Note  Patient: Kaitlyn [name]  Procedure(s) Performed: CYSTOSCOPY, WITH BLADDER HYDRODISTENSION (Bladder)     Patient location during evaluation: PACU Anesthesia Type: General Level of consciousness: awake Pain management: pain level controlled Vital Signs Assessment: post-procedure vital signs reviewed and stable Respiratory status: spontaneous breathing Cardiovascular status: blood pressure returned to baseline Postop Assessment: no apparent nausea or vomiting Anesthetic complications: no   There were no known notable events for this encounter.  Last Vitals:  Vitals:   09/02/24 1215 09/02/24 1300  BP: 108/66 122/75  Pulse: 77 84  Resp: 11 15  Temp:    SpO2: 91% 93%    Last Pain:  Vitals:   09/02/24 1215  TempSrc:   PainSc: 2                  Zissel Biederman T Colhoun      "

## 2024-09-02 NOTE — Anesthesia Procedure Notes (Signed)
 Procedure Name: LMA Insertion Date/Time: 09/02/2024 11:15 AM  Performed by: Erick Fitz, CRNAPre-anesthesia Checklist: Patient identified, Emergency Drugs available, Suction available and Patient being monitored Patient Re-evaluated:Patient Re-evaluated prior to induction Oxygen Delivery Method: Circle System Utilized Preoxygenation: Pre-oxygenation with 100% oxygen Induction Type: IV induction Ventilation: Mask ventilation without difficulty LMA: LMA with gastric port inserted LMA Size: 3.0 Number of attempts: 1 Placement Confirmation: positive ETCO2 Tube secured with: Tape Dental Injury: Teeth and Oropharynx as per pre-operative assessment  Comments: I gel

## 2024-09-03 ENCOUNTER — Encounter (HOSPITAL_COMMUNITY): Payer: Self-pay | Admitting: Urology

## 2024-09-03 LAB — URINE CULTURE: Culture: NO GROWTH
